# Patient Record
Sex: Male | Born: 1970
Health system: Southern US, Community
[De-identification: ages and names within clinical notes are randomized; demographics above are authoritative.]

## PROBLEM LIST (undated history)

## (undated) DIAGNOSIS — H409 Unspecified glaucoma: Secondary | ICD-10-CM

## (undated) DIAGNOSIS — E0590 Thyrotoxicosis, unspecified without thyrotoxic crisis or storm: Secondary | ICD-10-CM

## (undated) DIAGNOSIS — I10 Essential (primary) hypertension: Secondary | ICD-10-CM

## (undated) DIAGNOSIS — U071 COVID-19: Secondary | ICD-10-CM

## (undated) DIAGNOSIS — B079 Viral wart, unspecified: Secondary | ICD-10-CM

## (undated) DIAGNOSIS — E039 Hypothyroidism, unspecified: Secondary | ICD-10-CM

## (undated) DIAGNOSIS — Z8249 Family history of ischemic heart disease and other diseases of the circulatory system: Secondary | ICD-10-CM

## (undated) DIAGNOSIS — K219 Gastro-esophageal reflux disease without esophagitis: Secondary | ICD-10-CM

## (undated) DIAGNOSIS — B078 Other viral warts: Secondary | ICD-10-CM

## (undated) HISTORY — DX: Hypothyroidism, unspecified: E03.9

## (undated) HISTORY — DX: COVID-19: U07.1

## (undated) HISTORY — DX: Unspecified glaucoma: H40.9

## (undated) HISTORY — DX: Gastro-esophageal reflux disease without esophagitis: K21.9

## (undated) HISTORY — DX: Thyrotoxicosis, unspecified without thyrotoxic crisis or storm: E05.90

## (undated) HISTORY — DX: Family history of ischemic heart disease and other diseases of the circulatory system: Z82.49

## (undated) HISTORY — DX: Viral wart, unspecified: B07.9

## (undated) HISTORY — PX: NO PAST SURGERIES: SHX2092

---
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
## 1998-06-19 HISTORY — PX: ANTERIOR CRUCIATE LIGAMENT REPAIR: SHX115

## 1999-05-04 ENCOUNTER — Ambulatory Visit (HOSPITAL_BASED_OUTPATIENT_CLINIC_OR_DEPARTMENT_OTHER): Admission: RE | Admit: 1999-05-04 | Discharge: 1999-05-05 | Payer: Self-pay | Admitting: Orthopedic Surgery

## 1999-06-20 HISTORY — PX: HAND SURGERY: SHX662

## 2000-09-30 ENCOUNTER — Emergency Department (HOSPITAL_COMMUNITY): Admission: EM | Admit: 2000-09-30 | Discharge: 2000-09-30 | Payer: Self-pay | Admitting: Emergency Medicine

## 2008-04-09 ENCOUNTER — Encounter (HOSPITAL_COMMUNITY): Admission: RE | Admit: 2008-04-09 | Discharge: 2008-06-16 | Payer: Self-pay | Admitting: Internal Medicine

## 2008-07-04 ENCOUNTER — Encounter: Admission: RE | Admit: 2008-07-04 | Discharge: 2008-07-04 | Payer: Self-pay | Admitting: Sports Medicine

## 2015-09-23 ENCOUNTER — Encounter (HOSPITAL_COMMUNITY): Payer: Self-pay | Admitting: *Deleted

## 2015-09-23 ENCOUNTER — Ambulatory Visit (HOSPITAL_COMMUNITY)
Admission: EM | Admit: 2015-09-23 | Discharge: 2015-09-23 | Disposition: A | Discharge status: Home / Self Care | Payer: BLUE CROSS/BLUE SHIELD | Attending: Family Medicine | Admitting: Family Medicine

## 2015-09-23 DIAGNOSIS — A0811 Acute gastroenteropathy due to Norwalk agent: Secondary | ICD-10-CM

## 2015-09-23 LAB — POCT I-STAT, CHEM 8
BUN: 20 mg/dL (ref 6–20)
CHLORIDE: 103 mmol/L (ref 101–111)
Calcium, Ion: 1.26 mmol/L — ABNORMAL HIGH (ref 1.12–1.23)
Creatinine, Ser: 1.5 mg/dL — ABNORMAL HIGH (ref 0.61–1.24)
Glucose, Bld: 130 mg/dL — ABNORMAL HIGH (ref 65–99)
HEMATOCRIT: 54 % — AB (ref 39.0–52.0)
Hemoglobin: 18.4 g/dL — ABNORMAL HIGH (ref 13.0–17.0)
POTASSIUM: 4.1 mmol/L (ref 3.5–5.1)
SODIUM: 140 mmol/L (ref 135–145)
TCO2: 24 mmol/L (ref 0–100)

## 2015-09-23 MED ORDER — ONDANSETRON HCL 4 MG PO TABS: 4.0000 mg | ORAL_TABLET | Freq: Four times a day (QID) | ORAL | Status: DC

## 2015-09-23 MED ORDER — ONDANSETRON HCL 4 MG/2ML IJ SOLN
4.0000 mg | Freq: Once | INTRAMUSCULAR | Status: AC
  Administered 2015-09-23: 4 mg via INTRAVENOUS

## 2015-09-23 MED ORDER — SODIUM CHLORIDE 0.9 % IV BOLUS (SEPSIS)
1000.0000 mL | Freq: Once | INTRAVENOUS | Status: AC
  Administered 2015-09-23: 1000 mL via INTRAVENOUS

## 2015-09-23 MED ORDER — ONDANSETRON HCL 4 MG/2ML IJ SOLN
INTRAMUSCULAR | Status: AC
  Filled 2015-09-23: qty 2

## 2015-09-23 NOTE — ED Provider Notes (Signed)
CSN: [id number]     Arrival date & time 09/23/15  1326 History   First MD Initiated Contact with Patient 09/23/15 1515     Chief Complaint  Patient presents with  . Nausea   (Consider location/radiation/quality/duration/timing/severity/associated sxs/prior Treatment) Patient is a 45 y.o. male presenting with vomiting. The history is provided by the patient and the spouse.  Emesis Severity:  Moderate Duration:  1 day Timing:  Intermittent Quality:  Stomach contents Progression:  Unchanged Chronicity:  New Recent urination:  Normal Relieved by:  Nothing Worsened by:  Nothing tried Ineffective treatments:  None tried Associated symptoms: chills, diarrhea, fever and myalgias   Associated symptoms: no abdominal pain     History reviewed. No pertinent past medical history. History reviewed. No pertinent past surgical history. History reviewed. No pertinent family history. Social History  Substance Use Topics  . Smoking status: Never Smoker   . Smokeless tobacco: None  . Alcohol Use: No    Review of Systems  Constitutional: Positive for fever, chills and appetite change.  HENT: Negative.   Cardiovascular: Negative.   Gastrointestinal: Positive for nausea, vomiting and diarrhea. Negative for abdominal pain and blood in stool.  Musculoskeletal: Positive for myalgias.  All other systems reviewed and are negative.   Allergies  Review of patient's allergies indicates not on file.  Home Medications   Prior to Admission medications   Medication Sig Start Date End Date Taking? Authorizing Provider  ondansetron (ZOFRAN) 4 MG tablet Take 1 tablet (4 mg total) by mouth every 6 (six) hours. Prn n/v 09/23/15   [name], MD   Meds Ordered and Administered this Visit   Medications  ondansetron [facility]) injection 4 mg (4 mg Intravenous Given 09/23/15 1541)  sodium chloride 0.9 % bolus 1,000 mL (1,000 mLs Intravenous Given 09/23/15 1541)    BP 149/99 mmHg  Pulse 98  Temp(Src) 98.8  F (37.1 C) (Oral)  Resp 18  SpO2 100% No data found.   Physical Exam  Constitutional: He is oriented to person, place, and time. He appears well-developed and well-nourished. He is cooperative. He appears ill. No distress.  HENT:  Right Ear: External ear normal.  Left Ear: External ear normal.  Mouth/Throat: Oropharynx is clear and moist. Mucous membranes are dry.  Neck: Normal range of motion. Neck supple.  Cardiovascular: Normal rate, regular rhythm, normal heart sounds and intact distal pulses.   Pulmonary/Chest: Effort normal and breath sounds normal.  Abdominal: Soft. Bowel sounds are normal. He exhibits no distension and no mass. There is tenderness. There is no rebound and no guarding.  Lymphadenopathy:    He has no cervical adenopathy.  Neurological: He is alert and oriented to person, place, and time.  Skin: Skin is warm and dry.  Nursing note and vitals reviewed.   ED Course  Procedures (including critical care time)  Labs Review Labs Reviewed  POCT I-STAT, CHEM 8 - Abnormal; Notable for the following:    Creatinine, Ser 1.50 (*)    Glucose, Bld 130 (*)    Calcium, Ion 1.26 (*)    Hemoglobin 18.4 (*)    HCT 54.0 (*)    All other components within normal limits    Imaging Review No results found.   Visual Acuity Review  Right Eye Distance:   Left Eye Distance:   Bilateral Distance:    Right Eye Near:   Left Eye Near:    Bilateral Near:         MDM   1.  Gastroenteritis due to norovirus    Sx much improved after ivf and meds   [name], MD 09/23/15 2043

## 2015-09-23 NOTE — ED Notes (Signed)
Pt  Reports  Symptoms   Of  Nausea  Vomiting   Diarrhea      With back  Pain    Since  Weds        Mucous  Membranes  Are   Dry

## 2015-09-23 NOTE — Discharge Instructions (Signed)
Clear liquid , bland diet tonight as tolerated, advance on fri as improved, use medicine as needed, return or see your doctor if any problems.

## 2016-03-22 ENCOUNTER — Other Ambulatory Visit: Payer: Self-pay | Admitting: Otolaryngology

## 2016-03-22 DIAGNOSIS — E010 Iodine-deficiency related diffuse (endemic) goiter: Secondary | ICD-10-CM

## 2016-03-31 ENCOUNTER — Other Ambulatory Visit: Payer: BLUE CROSS/BLUE SHIELD

## 2016-04-04 ENCOUNTER — Ambulatory Visit (HOSPITAL_COMMUNITY)
Admission: EM | Admit: 2016-04-04 | Discharge: 2016-04-04 | Disposition: A | Discharge status: Home / Self Care | Payer: BLUE CROSS/BLUE SHIELD | Attending: Family Medicine | Admitting: Family Medicine

## 2016-04-04 ENCOUNTER — Encounter (HOSPITAL_COMMUNITY): Payer: Self-pay | Admitting: *Deleted

## 2016-04-04 DIAGNOSIS — S39012A Strain of muscle, fascia and tendon of lower back, initial encounter: Secondary | ICD-10-CM | POA: Diagnosis not present

## 2016-04-04 MED ORDER — CYCLOBENZAPRINE HCL 5 MG PO TABS: 5.0000 mg | ORAL_TABLET | Freq: Three times a day (TID) | ORAL | 0 refills | Status: DC | PRN

## 2016-04-04 MED ORDER — IBUPROFEN 800 MG PO TABS: 800.0000 mg | ORAL_TABLET | Freq: Three times a day (TID) | ORAL | 0 refills | Status: DC

## 2016-04-04 NOTE — ED Triage Notes (Signed)
Pt  Reports  He lifted  A  Box  8  Days   Ago  And  Felt  painin  His  Lower  Back     He  Reports  He  Has  Been  Working     Since  The  Accident        He  Reports  The  Pain is  Worse  On  Certain movements  And  posistions

## 2016-04-04 NOTE — ED Provider Notes (Signed)
[place]    CSN: [id number] Arrival date & time: 04/04/16  1456     History   Chief Complaint Chief Complaint  Patient presents with  . Back Pain    HPI Douglas Harper is a 45 y.o. male.   The history is provided by the patient.  Back Pain  Location:  Lumbar spine Quality:  Stiffness Radiates to:  Does not radiate Pain severity:  Moderate Onset quality:  Gradual Duration:  10 days Progression:  Worsening Chronicity:  Recurrent Context: occupational injury   Context comment:  Works at [employer] and lifting a box. Relieved by:  Heating pad Worsened by:  Coughing Associated symptoms: no abdominal pain, no abdominal swelling, no bladder incontinence, no bowel incontinence, no chest pain, no fever, no leg pain, no paresthesias, no pelvic pain, no perianal numbness and no weakness     History reviewed. No pertinent past medical history.  There are no active problems to display for this patient.   History reviewed. No pertinent surgical history.     Home Medications    Prior to Admission medications   Medication Sig Start Date End Date Taking? Authorizing Provider  ondansetron (ZOFRAN) 4 MG tablet Take 1 tablet (4 mg total) by mouth every 6 (six) hours. Prn n/v 09/23/15   [name], MD    Family History History reviewed. No pertinent family history.  Social History Social History  Substance Use Topics  . Smoking status: Never Smoker  . Smokeless tobacco: Never Used  . Alcohol use Yes     Allergies   Review of patient's allergies indicates no known allergies.   Review of Systems Review of Systems  Constitutional: Negative.  Negative for fever.  Cardiovascular: Negative for chest pain.  Gastrointestinal: Negative.  Negative for abdominal pain and bowel incontinence.  Genitourinary: Negative.  Negative for bladder incontinence and pelvic pain.  Musculoskeletal: Positive for back pain.  Neurological: Negative.  Negative for weakness and  paresthesias.  All other systems reviewed and are negative.    Physical Exam Triage Vital Signs ED Triage Vitals  Enc Vitals Group     BP 04/04/16 1520 155/97     Pulse Rate 04/04/16 1520 70     Resp 04/04/16 1520 16     Temp 04/04/16 1520 98 F (36.7 C)     Temp Source 04/04/16 1520 Oral     SpO2 04/04/16 1520 96 %     Weight --      Height --      Head Circumference --      Peak Flow --      Pain Score 04/04/16 1528 9     Pain Loc --      Pain Edu? --      Excl. in [place]? --    No data found.   Updated Vital Signs BP 155/97 (BP Location: Left Arm)   Pulse 70   Temp 98 F (36.7 C) (Oral)   Resp 16   SpO2 96%   Visual Acuity Right Eye Distance:   Left Eye Distance:   Bilateral Distance:    Right Eye Near:   Left Eye Near:    Bilateral Near:     Physical Exam  Constitutional: He is oriented to person, place, and time. He appears well-developed and well-nourished. No distress.  Abdominal: Soft. Bowel sounds are normal.  Musculoskeletal: He exhibits tenderness.       Lumbar back: He exhibits decreased range of motion, tenderness, pain and  spasm. He exhibits no bony tenderness, no swelling and normal pulse.  Neurological: He is alert and oriented to person, place, and time.  Skin: Skin is warm and dry.  Nursing note and vitals reviewed.    UC Treatments / Results  Labs (all labs ordered are listed, but only abnormal results are displayed) Labs Reviewed - No data to display  EKG  EKG Interpretation None       Radiology No results found.  Procedures Procedures (including critical care time)  Medications Ordered in UC Medications - No data to display   Initial Impression / Assessment and Plan / UC Course  I have reviewed the triage vital signs and the nursing notes.  Pertinent labs & imaging results that were available during my care of the patient were reviewed by me and considered in my medical decision making (see chart for  details).  Clinical Course      Final Clinical Impressions(s) / UC Diagnoses   Final diagnoses:  None    New Prescriptions New Prescriptions   No medications on file     [name], MD 04/04/16 1540

## 2016-04-04 NOTE — Discharge Instructions (Signed)
Heat, stretch and medicine as discussed.

## 2016-04-10 ENCOUNTER — Ambulatory Visit
Admission: RE | Admit: 2016-04-10 | Discharge: 2016-04-10 | Disposition: A | Discharge status: Home / Self Care | Payer: BLUE CROSS/BLUE SHIELD | Source: Ambulatory Visit | Attending: Otolaryngology | Admitting: Otolaryngology

## 2016-04-10 DIAGNOSIS — E010 Iodine-deficiency related diffuse (endemic) goiter: Secondary | ICD-10-CM

## 2016-11-21 ENCOUNTER — Other Ambulatory Visit: Payer: Self-pay | Admitting: Nurse Practitioner

## 2016-11-21 ENCOUNTER — Ambulatory Visit
Admission: RE | Admit: 2016-11-21 | Discharge: 2016-11-21 | Disposition: A | Discharge status: Home / Self Care | Payer: BLUE CROSS/BLUE SHIELD | Source: Ambulatory Visit | Attending: Nurse Practitioner | Admitting: Nurse Practitioner

## 2016-11-21 DIAGNOSIS — M545 Low back pain, unspecified: Secondary | ICD-10-CM

## 2018-07-23 DIAGNOSIS — L089 Local infection of the skin and subcutaneous tissue, unspecified: Secondary | ICD-10-CM | POA: Diagnosis not present

## 2018-07-23 DIAGNOSIS — L309 Dermatitis, unspecified: Secondary | ICD-10-CM | POA: Diagnosis not present

## 2018-08-20 DIAGNOSIS — L309 Dermatitis, unspecified: Secondary | ICD-10-CM | POA: Diagnosis not present

## 2018-08-22 ENCOUNTER — Encounter: Payer: Self-pay | Admitting: Podiatry

## 2018-08-22 ENCOUNTER — Ambulatory Visit: Payer: 59 | Admitting: Podiatry

## 2018-08-22 VITALS — BP 127/84

## 2018-08-22 DIAGNOSIS — L859 Epidermal thickening, unspecified: Secondary | ICD-10-CM | POA: Diagnosis not present

## 2018-08-22 DIAGNOSIS — B070 Plantar wart: Secondary | ICD-10-CM | POA: Diagnosis not present

## 2018-08-22 NOTE — Patient Instructions (Signed)

## 2018-08-25 NOTE — Progress Notes (Signed)
Subjective:   Patient ID: Douglas Harper, male   DOB: 48 y.o.   MRN: [id number]   HPI Patient presents with a very painful lesion on the outside of the right foot and states that it makes it hard for him to be active on it or walk on.  States that it is been hurting for at least 6 months and patient does not smoke likes to be active   Review of Systems  All other systems reviewed and are negative.       Objective:  Physical Exam Vitals signs and nursing note reviewed.  Constitutional:      Appearance: He is well-developed.  Pulmonary:     Effort: Pulmonary effort is normal.  Musculoskeletal: Normal range of motion.  Skin:    General: Skin is warm.  Neurological:     Mental Status: He is alert.     Neurovascular status intact muscle strength is adequate range of motion within normal limits with patient found to have a lesion on the plantar aspect of the right foot that measures approximately 1.3 cm x 1.2 cm and it shows pinpoint bleeding upon debridement and pain to lateral pressure.  Patient has good digital perfusion well oriented x3     Assessment:  Verruca plantaris plantar aspect right most likely scenario with quite a bit of pain and solitary lesion     Plan:  H&P and discussed chemical treatment versus excision and patient is opted for excision and I explained procedure and risk and he signed consent form understanding risk and the fact that it also could recur.  Today I went ahead and I infiltrated the right lateral foot with 120 mg Xylocaine with epinephrine sterile prep applied and using sterile instrumentation I excised the lesion in toto and I placed it in formalin for pathological evaluation and applied sterile dressing to the foot and instructed on reduced activity and weightbearing and padding.  Reappoint and we will check pathology for any abnormal condition

## 2018-10-17 MED FILL — LEVOTHYROXINE 112 MCG TAB: 112 | 90 days supply | Qty: 90 | Fill #0

## 2018-11-27 ENCOUNTER — Ambulatory Visit (INDEPENDENT_AMBULATORY_CARE_PROVIDER_SITE_OTHER): Payer: 59 | Admitting: Internal Medicine

## 2018-11-27 ENCOUNTER — Encounter: Payer: Self-pay | Admitting: Internal Medicine

## 2018-11-27 ENCOUNTER — Other Ambulatory Visit: Payer: Self-pay

## 2018-11-27 DIAGNOSIS — Z13220 Encounter for screening for lipoid disorders: Secondary | ICD-10-CM | POA: Diagnosis not present

## 2018-11-27 DIAGNOSIS — Z1389 Encounter for screening for other disorder: Secondary | ICD-10-CM | POA: Diagnosis not present

## 2018-11-27 DIAGNOSIS — Z1159 Encounter for screening for other viral diseases: Secondary | ICD-10-CM

## 2018-11-27 DIAGNOSIS — K219 Gastro-esophageal reflux disease without esophagitis: Secondary | ICD-10-CM | POA: Insufficient documentation

## 2018-11-27 DIAGNOSIS — Z125 Encounter for screening for malignant neoplasm of prostate: Secondary | ICD-10-CM

## 2018-11-27 DIAGNOSIS — E049 Nontoxic goiter, unspecified: Secondary | ICD-10-CM

## 2018-11-27 DIAGNOSIS — E0590 Thyrotoxicosis, unspecified without thyrotoxic crisis or storm: Secondary | ICD-10-CM

## 2018-11-27 DIAGNOSIS — Z1329 Encounter for screening for other suspected endocrine disorder: Secondary | ICD-10-CM

## 2018-11-27 DIAGNOSIS — R1310 Dysphagia, unspecified: Secondary | ICD-10-CM

## 2018-11-27 DIAGNOSIS — Z0000 Encounter for general adult medical examination without abnormal findings: Secondary | ICD-10-CM

## 2018-11-27 DIAGNOSIS — Z0184 Encounter for antibody response examination: Secondary | ICD-10-CM

## 2018-11-27 DIAGNOSIS — R739 Hyperglycemia, unspecified: Secondary | ICD-10-CM

## 2018-11-27 DIAGNOSIS — E039 Hypothyroidism, unspecified: Secondary | ICD-10-CM | POA: Insufficient documentation

## 2018-11-27 DIAGNOSIS — E559 Vitamin D deficiency, unspecified: Secondary | ICD-10-CM | POA: Diagnosis not present

## 2018-11-27 DIAGNOSIS — R1013 Epigastric pain: Secondary | ICD-10-CM | POA: Diagnosis not present

## 2018-11-27 DIAGNOSIS — Z8249 Family history of ischemic heart disease and other diseases of the circulatory system: Secondary | ICD-10-CM | POA: Insufficient documentation

## 2018-11-27 NOTE — Progress Notes (Signed)
Virtual Visit via Video Note  I connected with Douglas Harper  on 11/27/18 at  2:40 PM EDT by a video enabled telemedicine application and verified that I am speaking with the correct person using two identifiers.  Location patient: home Location provider:work  Persons participating in the virtual visit: patient, provider  I discussed the limitations of evaluation and management by telemedicine and the availability of in person appointments. The patient expressed understanding and agreed to proceed.   HPI: 1. Hyperthyroidism history with RAI in 2009 on levo 112 mcg daily compliant and c/o lump in throat with swallowing  2. GERD c/o increased GERD/heartburn x few weeks tried prilosec otc x 10 days helped initially but still sx's return  3. FH heart disease in mother and she died age 39 or 66 of hereditary heart condition pt does not know the name and he and all 8 siblings advised to get echo and be tested for genetic heart condition     ROS: See pertinent positives and negatives per HPI.  Past Medical History:  Diagnosis Date  . FH: heart disease   . GERD (gastroesophageal reflux disease)   . Hyperthyroidism    s/p RAI 2009   . Hypothyroidism     Past Surgical History:  Procedure Laterality Date  . NO PAST SURGERIES      Family History  Problem Relation Age of Onset  . Heart disease Mother   . Hypertension Mother   . Diabetes Mother     SOCIAL HX:  Married wife Douglas Harper DPR  From [place] been here since 48 y.o  Works Designer, [employer]  No tobacco, drugs, occas. etoh    Current Outpatient Medications:  .  ibuprofen (ADVIL,MOTRIN) 800 MG tablet, Take 1 tablet (800 mg total) by mouth 3 (three) times daily., Disp: 30 tablet, Rfl: 0 .  levothyroxine (SYNTHROID, LEVOTHROID) 112 MCG tablet, TAKE 1 TABLET EVERY DAY IN THE MORNING ON EMPTY STOMACH, Disp: , Rfl:   EXAM:  VITALS per patient if applicable:  GENERAL: alert, oriented, appears well and in no acute  distress  HEENT: atraumatic, conjunttiva clear, no obvious abnormalities on inspection of external nose and ears  NECK: normal movements of the head and neck  LUNGS: on inspection no signs of respiratory distress, breathing rate appears normal, no obvious gross SOB, gasping or wheezing  CV: no obvious cyanosis  MS: moves all visible extremities without noticeable abnormality  PSYCH/NEURO: pleasant and cooperative, no obvious depression or anxiety, speech and thought processing grossly intact  ASSESSMENT AND PLAN:  Discussed the following assessment and plan:  Gastroesophageal reflux disease, esophagitis presence not specified - Plan: H Pylori, IGM, IGG, IGA AB Dyspepsia - Plan: H Pylori, IGM, IGG, IGA AB -mailed food list gerd   Hyperthyroidism s/p RAI 2009 with dysphagia - Plan: US THYROID GI in [place] Levo 112 mcg qd  Goiter now with hypothyroidism- Plan: US THYROID -cont thyroid med   FH: heart disease - Plan: Ambulatory referral to Cardiac Electrophysiology Dr. Virl Axe to further w/u with echo and consider genetic testing   HM utd flu shot  Check hep B and MMR Consider Tdap in future   Check PSA Colonoscopy due age 36 y.o     I discussed the assessment and treatment plan with the patient. The patient was provided an opportunity to ask questions and all were answered. The patient agreed with the plan and demonstrated an understanding of the instructions.   The patient was advised to  call back or seek an in-person evaluation if the symptoms worsen or if the condition fails to improve as anticipated.  Time spent 25 minutes  Delorise Jackson, MD

## 2018-11-27 NOTE — Patient Instructions (Signed)
Goiter  A goiter is an enlarged thyroid gland. The thyroid is located in the lower front of the neck. It makes hormones that affect many body parts and systems, including the system that affects how quickly the body burns fuel for energy (metabolism). Most goiters are painless and are not a cause for concern. Some goiters can affect the way your thyroid makes thyroid hormones. Goiters and conditions that cause goiters can be treated, if necessary. What are the causes? Common causes of this condition include:  Lack (deficiency) of a mineral called iodine. The thyroid gland uses iodine to make thyroid hormones.  Diseases that attack healthy cells in the body (autoimmune diseases) and affect thyroid function, such as Graves' disease or Hashimoto's disease. These diseases may cause the body to produce too much thyroid hormone (hyperthyroidism) or too little of the hormone (hypothyroidism).  Conditions that cause inflammation of the thyroid (thyroiditis).  One or more small growths on the thyroid (nodular goiter). Other causes include:  Medical problems caused by abnormal genes that are passed from parent to child (genetic defects).  Thyroid injury or infection.  Tumors that may or may not be cancerous.  Pregnancy.  Certain medicines.  Exposure to radiation. In some cases, the cause may not be known. What increases the risk? This condition is more likely to develop in:  People who do not get enough iodine in their diet.  People who have a family history of goiter.  Women.  People who are older than age 40.  People who smoke tobacco.  People who have had exposure to radiation. What are the signs or symptoms? The main symptom of this condition is swelling in the lower, front part of the neck. This swelling can range from a very small bump to a large lump. Other symptoms may include:  A tight feeling in the throat.  A hoarse voice.  Coughing.  Wheezing.  Difficulty  swallowing or breathing.  Bulging veins in the neck.  Dizziness. When a goiter is the result of an overactive thyroid (hyperthyroidism), symptoms may also include:  Nervousness or restlessness.  Inability to tolerate heat.  Unexplained weight loss.  Diarrhea.  Change in the texture of hair or skin.  Changes in heartbeat, such as skipped beats, extra beats, or a rapid heart rate.  Loss of menstruation.  Shaky hands.  Increased appetite.  Sleep problems. When a goiter is the result of an underactive thyroid (hypothyroidism), symptoms may also include:  Feeling like you have no energy (lethargy).  Inability to tolerate cold.  Weight gain that is not explained by a change in diet or exercise habits.  Dry skin.  Coarse hair.  Irregular menstrual periods.  Constipation.  Sadness or depression.  Fatigue. In some cases, there may not be any symptoms and the thyroid hormone levels may be normal. How is this diagnosed? This condition may be diagnosed based on your symptoms, your medical history, and a physical exam. You may have tests, such as:  Blood tests to check thyroid function.  Imaging tests, such as: ? Ultrasound. ? CT scan. ? MRI. ? Thyroid scan.  Removal of a tissue sample (biopsy) of the goiter or any nodules. The sample will be tested to check for cancer. How is this treated? Treatment for this condition depends on the cause and your symptoms. Treatment may include:  Medicines to regulate thyroid hormone levels.  Anti-inflammatory medicines or steroid medicines, if the goiter is caused by inflammation.  Iodine supplements or changes to your   diet, if the goiter is caused by iodine deficiency.  Radioactive iodine treatment.  Surgery to remove your thyroid. In some cases, you may only need regular check-ups with your health care provider to monitor your condition, and you may not need treatment. Follow these instructions at home:  Follow  instructions from your health care provider about any changes to your diet.  Take over-the-counter and prescription medicines only as told by your health care provider. These include supplements.  Do not use any products that contain nicotine or tobacco, such as cigarettes and e-cigarettes. If you need help quitting, ask your health care provider.  Keep all follow-up visits as told by your health care provider. This is important. Contact a health care provider if:  Your symptoms do not get better with treatment.  You have nausea, vomiting, or diarrhea. Get help right away if:  You have sudden, unexplained confusion or other mental changes.  You have a fever.  You have chest pain.  You have trouble breathing or swallowing.  You suddenly become very weak.  You experience extreme restlessness.  You feel your heart racing. Summary  A goiter is an enlarged thyroid gland.  The thyroid gland is located in the lower front of the neck. It makes hormones that affect many body parts and systems, including the system that affects how quickly the body burns fuel for energy (metabolism).  The main symptom of this condition is swelling in the lower, front part of the neck. This swelling can range from a very small bump to a large lump.  Treatment for this condition depends on the cause and your symptoms. You may need medicines, supplements, or regular monitoring of your condition. This information is not intended to replace advice given to you by your health care provider. Make sure you discuss any questions you have with your health care provider. Document Released: 11/23/2009 Document Revised: 03/01/2017 Document Reviewed: 03/01/2017 Elsevier Interactive Patient Education  2019 [place].  Gastroesophageal Reflux Disease, Adult Gastroesophageal reflux (GER) happens when acid from the stomach flows up into the tube that connects the mouth and the stomach (esophagus). Normally, food  travels down the esophagus and stays in the stomach to be digested. However, when a person has GER, food and stomach acid sometimes move back up into the esophagus. If this becomes a more serious problem, the person may be diagnosed with a disease called gastroesophageal reflux disease (GERD). GERD occurs when the reflux:  Happens often.  Causes frequent or severe symptoms.  Causes problems such as damage to the esophagus. When stomach acid comes in contact with the esophagus, the acid may cause soreness (inflammation) in the esophagus. Over time, GERD may create small holes (ulcers) in the lining of the esophagus. What are the causes? This condition is caused by a problem with the muscle between the esophagus and the stomach (lower esophageal sphincter, or LES). Normally, the LES muscle closes after food passes through the esophagus to the stomach. When the LES is weakened or abnormal, it does not close properly, and that allows food and stomach acid to go back up into the esophagus. The LES can be weakened by certain dietary substances, medicines, and medical conditions, including:  Tobacco use.  Pregnancy.  Having a hiatal hernia.  Alcohol use.  Certain foods and beverages, such as coffee, chocolate, onions, and peppermint. What increases the risk? You are more likely to develop this condition if you:  Have an increased body weight.  Have a connective tissue  disorder.  Use NSAID medicines. What are the signs or symptoms? Symptoms of this condition include:  Heartburn.  Difficult or painful swallowing.  The feeling of having a lump in the throat.  Abitter taste in the mouth.  Bad breath.  Having a large amount of saliva.  Having an upset or bloated stomach.  Belching.  Chest pain. Different conditions can cause chest pain. Make sure you see your health care provider if you experience chest pain.  Shortness of breath or wheezing.  Ongoing (chronic) cough or a  night-time cough.  Wearing away of tooth enamel.  Weight loss. How is this diagnosed? Your health care provider will take a medical history and perform a physical exam. To determine if you have mild or severe GERD, your health care provider may also monitor how you respond to treatment. You may also have tests, including:  A test to examine your stomach and esophagus with a small camera (endoscopy).  A test thatmeasures the acidity level in your esophagus.  A test thatmeasures how much pressure is on your esophagus.  A barium swallow or modified barium swallow test to show the shape, size, and functioning of your esophagus. How is this treated? The goal of treatment is to help relieve your symptoms and to prevent complications. Treatment for this condition may vary depending on how severe your symptoms are. Your health care provider may recommend:  Changes to your diet.  Medicine.  Surgery. Follow these instructions at home: Eating and drinking   Follow a diet as recommended by your health care provider. This may involve avoiding foods and drinks such as: ? Coffee and tea (with or without caffeine). ? Drinks that containalcohol. ? Energy drinks and sports drinks. ? Carbonated drinks or sodas. ? Chocolate and cocoa. ? Peppermint and mint flavorings. ? Garlic and onions. ? Horseradish. ? Spicy and acidic foods, including peppers, chili powder, curry powder, vinegar, hot sauces, and barbecue sauce. ? Citrus fruit juices and citrus fruits, such as oranges, lemons, and limes. ? Tomato-based foods, such as red sauce, chili, salsa, and pizza with red sauce. ? Fried and fatty foods, such as donuts, french fries, potato chips, and high-fat dressings. ? High-fat meats, such as hot dogs and fatty cuts of red and white meats, such as rib eye steak, sausage, ham, and bacon. ? High-fat dairy items, such as whole milk, butter, and cream cheese.  Eat small, frequent meals instead of  large meals.  Avoid drinking large amounts of liquid with your meals.  Avoid eating meals during the 2-3 hours before bedtime.  Avoid lying down right after you eat.  Do not exercise right after you eat. Lifestyle   Do not use any products that contain nicotine or tobacco, such as cigarettes, e-cigarettes, and chewing tobacco. If you need help quitting, ask your health care provider.  Try to reduce your stress by using methods such as yoga or meditation. If you need help reducing stress, ask your health care provider.  If you are overweight, reduce your weight to an amount that is healthy for you. Ask your health care provider for guidance about a safe weight loss goal. General instructions  Pay attention to any changes in your symptoms.  Take over-the-counter and prescription medicines only as told by your health care provider. Do not take aspirin, ibuprofen, or other NSAIDs unless your health care provider told you to do so.  Wear loose-fitting clothing. Do not wear anything tight around your waist that causes pressure on  your abdomen.  Raise (elevate) the head of your bed about 6 inches (15 cm).  Avoid bending over if this makes your symptoms worse.  Keep all follow-up visits as told by your health care provider. This is important. Contact a health care provider if:  You have: ? New symptoms. ? Unexplained weight loss. ? Difficulty swallowing or it hurts to swallow. ? Wheezing or a persistent cough. ? A hoarse voice.  Your symptoms do not improve with treatment. Get help right away if you:  Have pain in your arms, neck, jaw, teeth, or back.  Feel sweaty, dizzy, or light-headed.  Have chest pain or shortness of breath.  Vomit and your vomit looks like blood or coffee grounds.  Faint.  Have stool that is bloody or black.  Cannot swallow, drink, or eat. Summary  Gastroesophageal reflux happens when acid from the stomach flows up into the esophagus. GERD is a  disease in which the reflux happens often, causes frequent or severe symptoms, or causes problems such as damage to the esophagus.  Treatment for this condition may vary depending on how severe your symptoms are. Your health care provider may recommend diet and lifestyle changes, medicine, or surgery.  Contact a health care provider if you have new or worsening symptoms.  Take over-the-counter and prescription medicines only as told by your health care provider. Do not take aspirin, ibuprofen, or other NSAIDs unless your health care provider told you to do so.  Keep all follow-up visits as told by your health care provider. This is important. This information is not intended to replace advice given to you by your health care provider. Make sure you discuss any questions you have with your health care provider. Document Released: 03/15/2005 Document Revised: 12/12/2017 Document Reviewed: 12/12/2017 Elsevier Interactive Patient Education  2019 [place] for Gastroesophageal Reflux Disease, Adult When you have gastroesophageal reflux disease (GERD), the foods you eat and your eating habits are very important. Choosing the right foods can help ease the discomfort of GERD. Consider working with a diet and nutrition specialist (dietitian) to help you make healthy food choices. What general guidelines should I follow?  Eating plan  Choose healthy foods low in fat, such as fruits, vegetables, whole grains, low-fat dairy products, and lean meat, fish, and poultry.  Eat frequent, small meals instead of three large meals each day. Eat your meals slowly, in a relaxed setting. Avoid bending over or lying down until 2-3 hours after eating.  Limit high-fat foods such as fatty meats or fried foods.  Limit your intake of oils, butter, and shortening to less than 8 teaspoons each day.  Avoid the following: ? Foods that cause symptoms. These may be different for different people. Keep a  food diary to keep track of foods that cause symptoms. ? Alcohol. ? Drinking large amounts of liquid with meals. ? Eating meals during the 2-3 hours before bed.  Cook foods using methods other than frying. This may include baking, grilling, or broiling. Lifestyle  Maintain a healthy weight. Ask your health care provider what weight is healthy for you. If you need to lose weight, work with your health care provider to do so safely.  Exercise for at least 30 minutes on 5 or more days each week, or as told by your health care provider.  Avoid wearing clothes that fit tightly around your waist and chest.  Do not use any products that contain nicotine or tobacco, such as cigarettes and  e-cigarettes. If you need help quitting, ask your health care provider.  Sleep with the head of your bed raised. Use a wedge under the mattress or blocks under the bed frame to raise the head of the bed. What foods are not recommended? The items listed may not be a complete list. Talk with your dietitian about what dietary choices are best for you. Grains Pastries or quick breads with added fat. [place] toast. Vegetables Deep fried vegetables. [place] fries. Any vegetables prepared with added fat. Any vegetables that cause symptoms. For some people this may include tomatoes and tomato products, chili peppers, onions and garlic, and horseradish. Fruits Any fruits prepared with added fat. Any fruits that cause symptoms. For some people this may include citrus fruits, such as oranges, grapefruit, pineapple, and lemons. Meats and other protein foods High-fat meats, such as fatty beef or pork, hot dogs, ribs, ham, sausage, salami and bacon. Fried meat or protein, including fried fish and fried chicken. Nuts and nut butters. Dairy Whole milk and chocolate milk. Sour cream. Cream. Ice cream. Cream cheese. Milk shakes. Beverages Coffee and tea, with or without caffeine. Carbonated beverages. Sodas. Energy drinks. Fruit  juice made with acidic fruits (such as orange or grapefruit). Tomato juice. Alcoholic drinks. Fats and oils Butter. Margarine. Shortening. Ghee. Sweets and desserts Chocolate and cocoa. Donuts. Seasoning and other foods Pepper. Peppermint and spearmint. Any condiments, herbs, or seasonings that cause symptoms. For some people, this may include curry, hot sauce, or vinegar-based salad dressings. Summary  When you have gastroesophageal reflux disease (GERD), food and lifestyle choices are very important to help ease the discomfort of GERD.  Eat frequent, small meals instead of three large meals each day. Eat your meals slowly, in a relaxed setting. Avoid bending over or lying down until 2-3 hours after eating.  Limit high-fat foods such as fatty meat or fried foods. This information is not intended to replace advice given to you by your health care provider. Make sure you discuss any questions you have with your health care provider. Document Released: 06/05/2005 Document Revised: 06/06/2016 Document Reviewed: 06/06/2016 Elsevier Interactive Patient Education  2019 [employer].

## 2018-11-28 ENCOUNTER — Other Ambulatory Visit: Payer: Self-pay

## 2018-11-28 ENCOUNTER — Other Ambulatory Visit (INDEPENDENT_AMBULATORY_CARE_PROVIDER_SITE_OTHER): Payer: 59

## 2018-11-28 DIAGNOSIS — Z0184 Encounter for antibody response examination: Secondary | ICD-10-CM | POA: Diagnosis not present

## 2018-11-28 DIAGNOSIS — Z0000 Encounter for general adult medical examination without abnormal findings: Secondary | ICD-10-CM

## 2018-11-28 DIAGNOSIS — Z1159 Encounter for screening for other viral diseases: Secondary | ICD-10-CM | POA: Diagnosis not present

## 2018-11-28 DIAGNOSIS — R739 Hyperglycemia, unspecified: Secondary | ICD-10-CM | POA: Diagnosis not present

## 2018-11-28 DIAGNOSIS — E559 Vitamin D deficiency, unspecified: Secondary | ICD-10-CM

## 2018-11-28 DIAGNOSIS — Z13220 Encounter for screening for lipoid disorders: Secondary | ICD-10-CM | POA: Diagnosis not present

## 2018-11-28 DIAGNOSIS — R1013 Epigastric pain: Secondary | ICD-10-CM

## 2018-11-28 DIAGNOSIS — Z1329 Encounter for screening for other suspected endocrine disorder: Secondary | ICD-10-CM | POA: Diagnosis not present

## 2018-11-28 DIAGNOSIS — Z1389 Encounter for screening for other disorder: Secondary | ICD-10-CM

## 2018-11-28 DIAGNOSIS — Z125 Encounter for screening for malignant neoplasm of prostate: Secondary | ICD-10-CM | POA: Diagnosis not present

## 2018-11-28 DIAGNOSIS — K219 Gastro-esophageal reflux disease without esophagitis: Secondary | ICD-10-CM | POA: Diagnosis not present

## 2018-11-29 ENCOUNTER — Other Ambulatory Visit (INDEPENDENT_AMBULATORY_CARE_PROVIDER_SITE_OTHER): Payer: 59

## 2018-11-29 ENCOUNTER — Other Ambulatory Visit: Payer: Self-pay | Admitting: Internal Medicine

## 2018-11-29 DIAGNOSIS — R718 Other abnormality of red blood cells: Secondary | ICD-10-CM

## 2018-11-29 LAB — LIPID PANEL
Cholesterol: 186 mg/dL (ref 0–200)
HDL: 43.1 mg/dL (ref >39.00)
LDL Cholesterol: 127 mg/dL — ABNORMAL HIGH (ref 0–99)
NonHDL: 142.5
Total CHOL/HDL Ratio: 4
Triglycerides: 76 mg/dL (ref 0.0–149.0)
VLDL: 15.2 mg/dL (ref 0.0–40.0)

## 2018-11-29 LAB — COMPREHENSIVE METABOLIC PANEL
ALT: 66 U/L — ABNORMAL HIGH (ref 0–53)
AST: 39 U/L — ABNORMAL HIGH (ref 0–37)
Albumin: 4.5 g/dL (ref 3.5–5.2)
Alkaline Phosphatase: 54 U/L (ref 39–117)
BUN: 11 mg/dL (ref 6–23)
CO2: 29 mEq/L (ref 19–32)
Calcium: 9.5 mg/dL (ref 8.4–10.5)
Chloride: 102 mEq/L (ref 96–112)
Creatinine, Ser: 1.21 mg/dL (ref 0.40–1.50)
GFR: 77.2 mL/min (ref >60.00)
Glucose, Bld: 87 mg/dL (ref 70–99)
Potassium: 4.3 mEq/L (ref 3.5–5.1)
Sodium: 138 mEq/L (ref 135–145)
Total Bilirubin: 1.5 mg/dL — ABNORMAL HIGH (ref 0.2–1.2)
Total Protein: 7.6 g/dL (ref 6.0–8.3)

## 2018-11-29 LAB — CBC WITH DIFFERENTIAL/PLATELET
Basophils Absolute: 0 10*3/uL (ref 0.0–0.1)
Basophils Relative: 1.1 % (ref 0.0–3.0)
Eosinophils Absolute: 0.1 10*3/uL (ref 0.0–0.7)
Eosinophils Relative: 2.3 % (ref 0.0–5.0)
HCT: 43.6 % (ref 39.0–52.0)
Hemoglobin: 13.5 g/dL (ref 13.0–17.0)
Lymphocytes Relative: 33 % (ref 12.0–46.0)
Lymphs Abs: 1.3 10*3/uL (ref 0.7–4.0)
MCHC: 31.1 g/dL (ref 30.0–36.0)
MCV: 63.9 fl — ABNORMAL LOW (ref 78.0–100.0)
Monocytes Absolute: 0.3 10*3/uL (ref 0.1–1.0)
Monocytes Relative: 8.4 % (ref 3.0–12.0)
Neutro Abs: 2.2 10*3/uL (ref 1.4–7.7)
Neutrophils Relative %: 55.2 % (ref 43.0–77.0)
Platelets: 156 10*3/uL (ref 150.0–400.0)
RBC: 6.82 Mil/uL — ABNORMAL HIGH (ref 4.22–5.81)
RDW: 16.8 % — ABNORMAL HIGH (ref 11.5–15.5)
WBC: 4 10*3/uL (ref 4.0–10.5)

## 2018-11-29 LAB — TSH: TSH: 5.76 u[IU]/mL — ABNORMAL HIGH (ref 0.35–4.50)

## 2018-11-29 LAB — HEMOGLOBIN A1C: Hgb A1c MFr Bld: 6 % (ref 4.6–6.5)

## 2018-11-29 LAB — URINALYSIS, ROUTINE W REFLEX MICROSCOPIC
Bacteria, UA: NONE SEEN /HPF
Bilirubin Urine: NEGATIVE
Glucose, UA: NEGATIVE
Hgb urine dipstick: NEGATIVE
Hyaline Cast: NONE SEEN /LPF
Ketones, ur: NEGATIVE
Leukocytes,Ua: NEGATIVE
Nitrite: NEGATIVE
Specific Gravity, Urine: 1.025 (ref 1.001–1.03)
Squamous Epithelial / LPF: NONE SEEN /HPF (ref <=5)
WBC, UA: NONE SEEN /HPF (ref 0–5)
pH: 5.5 (ref 5.0–8.0)

## 2018-11-29 LAB — T4, FREE: Free T4: 0.92 ng/dL (ref 0.60–1.60)

## 2018-11-29 LAB — IBC + FERRITIN
Ferritin: 178.5 ng/mL (ref 22.0–322.0)
Iron: 92 ug/dL (ref 42–165)
Saturation Ratios: 28.1 % (ref 20.0–50.0)
Transferrin: 234 mg/dL (ref 212.0–360.0)

## 2018-11-29 LAB — VITAMIN D 25 HYDROXY (VIT D DEFICIENCY, FRACTURES): VITD: 29.61 ng/mL — ABNORMAL LOW (ref 30.00–100.00)

## 2018-11-29 LAB — T3, FREE: T3, Free: 2.7 pg/mL (ref 2.3–4.2)

## 2018-11-29 LAB — PSA: PSA: 0.64 ng/mL (ref 0.10–4.00)

## 2018-11-30 LAB — H PYLORI, IGM, IGG, IGA AB
H pylori, IgM Abs: <9 units (ref 0.0–8.9)
H. pylori, IgA Abs: <9 units (ref 0.0–8.9)
H. pylori, IgG AbS: 0.5 Index Value (ref 0.00–0.79)

## 2018-12-02 LAB — MEASLES/MUMPS/RUBELLA IMMUNITY
Mumps IgG: 216 AU/mL
Rubella: <0.9 index — ABNORMAL LOW
Rubeola IgG: <13.5 AU/mL — ABNORMAL LOW

## 2018-12-02 LAB — HEPATITIS B SURFACE ANTIBODY, QUANTITATIVE: Hepatitis B-Post: <5 m[IU]/mL — ABNORMAL LOW (ref >=10)

## 2018-12-03 ENCOUNTER — Other Ambulatory Visit: Payer: Self-pay | Admitting: Internal Medicine

## 2018-12-03 DIAGNOSIS — R7989 Other specified abnormal findings of blood chemistry: Secondary | ICD-10-CM

## 2018-12-23 ENCOUNTER — Other Ambulatory Visit: Payer: Self-pay | Admitting: Internal Medicine

## 2018-12-23 ENCOUNTER — Telehealth: Payer: Self-pay

## 2018-12-23 ENCOUNTER — Ambulatory Visit
Admission: RE | Admit: 2018-12-23 | Discharge: 2018-12-23 | Disposition: A | Discharge status: Home / Self Care | Payer: 59 | Source: Ambulatory Visit | Attending: Internal Medicine | Admitting: Internal Medicine

## 2018-12-23 DIAGNOSIS — R7989 Other specified abnormal findings of blood chemistry: Secondary | ICD-10-CM

## 2018-12-23 DIAGNOSIS — E039 Hypothyroidism, unspecified: Secondary | ICD-10-CM

## 2018-12-23 DIAGNOSIS — R945 Abnormal results of liver function studies: Secondary | ICD-10-CM | POA: Diagnosis not present

## 2018-12-23 DIAGNOSIS — E034 Atrophy of thyroid (acquired): Secondary | ICD-10-CM | POA: Diagnosis not present

## 2018-12-23 DIAGNOSIS — R1310 Dysphagia, unspecified: Secondary | ICD-10-CM

## 2018-12-23 DIAGNOSIS — E890 Postprocedural hypothyroidism: Secondary | ICD-10-CM

## 2018-12-23 DIAGNOSIS — Z0183 Encounter for blood typing: Secondary | ICD-10-CM

## 2018-12-23 DIAGNOSIS — E0590 Thyrotoxicosis, unspecified without thyrotoxic crisis or storm: Secondary | ICD-10-CM

## 2018-12-23 DIAGNOSIS — E049 Nontoxic goiter, unspecified: Secondary | ICD-10-CM

## 2018-12-23 MED ORDER — LEVOTHYROXINE SODIUM 125 MCG PO TABS: 125.0000 ug | ORAL_TABLET | Freq: Every day | ORAL | 1 refills | Status: DC

## 2018-12-23 MED FILL — LEVOTHYROXINE 125 MCG TABLE: 125 | 90 days supply | Qty: 90 | Fill #0

## 2018-12-23 NOTE — Telephone Encounter (Signed)
Copied from [place] [id number]. Topic: General - Other >> Dec 23, 2018 11:57 AM Leward Quan A wrote: Reason for CRM: Patient called to request Rx for levothyroxine (SYNTHROID) 125 MCG tablet  to be sent to the [place], [place] [facility] [place] [phone] (Phone) [phone] (Fax)

## 2018-12-23 NOTE — Telephone Encounter (Signed)
Patient is aware and said that he has already called [facility] pharmacy to have the Rx transferred to [facility].

## 2018-12-23 NOTE — Telephone Encounter (Signed)
I sent to [facility] initially where I was advised to sent  if he wants to [facility] he needs to call [facility] and Get Rx transferred there they can see the order from [facility]   Thanks [place]

## 2018-12-23 NOTE — Telephone Encounter (Signed)
Patient requesting a refill on levothyroxine 125 MCG tab.  Patient has not had a normal TSH lab within the last year.  Last TSH lab on 11/28/18 was abnormal.  Last OV was on 11/27/18.  Do you want to refill?

## 2018-12-24 ENCOUNTER — Encounter: Payer: Self-pay | Admitting: Internal Medicine

## 2019-02-13 ENCOUNTER — Encounter: Payer: Self-pay | Admitting: Internal Medicine

## 2019-02-17 NOTE — Addendum Note (Signed)
Addended by: Orland Mustard on: 02/17/2019 06:31 PM   Modules accepted: Orders

## 2019-02-27 ENCOUNTER — Ambulatory Visit (INDEPENDENT_AMBULATORY_CARE_PROVIDER_SITE_OTHER): Payer: 59

## 2019-02-27 ENCOUNTER — Ambulatory Visit: Payer: 59 | Admitting: Genetic Counselor

## 2019-02-27 ENCOUNTER — Ambulatory Visit: Payer: Self-pay | Admitting: Internal Medicine

## 2019-02-27 ENCOUNTER — Other Ambulatory Visit: Payer: Self-pay

## 2019-02-27 DIAGNOSIS — Z23 Encounter for immunization: Secondary | ICD-10-CM | POA: Diagnosis not present

## 2019-02-27 NOTE — Addendum Note (Signed)
Addended by: Gwenyth Ober R on: 02/27/2019 03:47 PM   Modules accepted: Orders

## 2019-03-03 ENCOUNTER — Other Ambulatory Visit (INDEPENDENT_AMBULATORY_CARE_PROVIDER_SITE_OTHER): Payer: 59

## 2019-03-03 ENCOUNTER — Other Ambulatory Visit: Payer: Self-pay

## 2019-03-03 DIAGNOSIS — E039 Hypothyroidism, unspecified: Secondary | ICD-10-CM

## 2019-03-03 DIAGNOSIS — Z0183 Encounter for blood typing: Secondary | ICD-10-CM

## 2019-03-03 DIAGNOSIS — R945 Abnormal results of liver function studies: Secondary | ICD-10-CM

## 2019-03-03 DIAGNOSIS — R7989 Other specified abnormal findings of blood chemistry: Secondary | ICD-10-CM

## 2019-03-03 LAB — HEPATIC FUNCTION PANEL
ALT: 95 U/L — ABNORMAL HIGH (ref 0–53)
AST: 36 U/L (ref 0–37)
Albumin: 4.4 g/dL (ref 3.5–5.2)
Alkaline Phosphatase: 54 U/L (ref 39–117)
Bilirubin, Direct: 0.1 mg/dL (ref 0.0–0.3)
Total Bilirubin: 1 mg/dL (ref 0.2–1.2)
Total Protein: 7.5 g/dL (ref 6.0–8.3)

## 2019-03-03 LAB — TSH: TSH: 1.92 u[IU]/mL (ref 0.35–4.50)

## 2019-03-04 LAB — ABO AND RH: Rh Factor: POSITIVE

## 2019-03-05 ENCOUNTER — Encounter: Payer: Self-pay | Admitting: Internal Medicine

## 2019-03-07 ENCOUNTER — Other Ambulatory Visit: Payer: Self-pay | Admitting: Internal Medicine

## 2019-03-07 DIAGNOSIS — Z1211 Encounter for screening for malignant neoplasm of colon: Secondary | ICD-10-CM

## 2019-03-09 NOTE — Progress Notes (Signed)
Pre Test [facility]  Referral Reason  Tarig Barrozo was referred for genetic consult for a familial cardiomyopathy.  Genetic Consultation Notes  Constantinos (III.5 on pedigree) is a pleasant African American gentleman who works as a Chemical engineer at [employer]. He reports no limitations and denies having symptoms of dyspnea, chest pains, or syncope.   Family history Since Rowland was not aware of his mother's heart condition, his family history was compiled with the help of his older sister, Big V (III.2) who spoke with [place] over the phone during this visit.  She informs me that their mother (II.7) had heart issues prior to the age of 73. She complained of shortness of breath, severe fatigue and had passed out on two occasions. She used to go to [place] for treatment. Last year her symptoms of shortness of breath and fatigue significantly worsened. She was treated at [facility] where she was diagnosed with obstructive HCM at age 5. A year later she was taken to the ER for sever chest pains and was placed on life support. As additional management strategies proved ineffective in improving her heart condition, she agreed to be taken off life support and passed away at age 36.   Issaih' mother is the third of 8 children. Her eldest sister (II.8) was also diagnosed with HCM at age 28. She passed away 10 years later. She has three children (III.8-III.10) who are currently in good health. Her other siblings are alive and do not have overt heart issues. They do mention a maternal cousin (III.12) who has heart disease, the details of which are not clear to Adisa and his siblings.  Moiz' maternal grandfather (I.3) had non-ischemic cardiomyopathy (NICM) at age 19, pacemaker at 26 and died of a stroke at 13.   Celvin (III.5) has 4 sisters (III.1-III.3, III.6) and two brothers (III.4, III.7). His 50 year-old sister (III.3) complains of chest pains and is managed by a cardiologist. They are not sure if she has HCM. Other than  Big V, he and his other siblings have not been screened for HCM. Ervin has two children; a healthy 64year old daughter (IV.24) and a 53 year old son (IV.2) who was found to have a heart murmur at birth  Johnson and his sister was counseled on the genetics of hypertrophic cardiomyopathy (HCM), namely its autosomal dominant inheritance. We also talked about incomplete penetrance, variable expression and digenic/compound mutations that can be seen in some patients with HCM. We briefly discussed the inheritance pattern and treatment plans for the infiltrative cardiomyopathies that present as HCM phenocopies.   Impression  In summary, there is a significant family history of HCM in Roberth' family. His mother and maternal  aunt were diagnosed with HCM in their 73s and passed away from this condition. In addition, his maternal grandfather had NICM. It is likely that his mother and her sister inherited the condition from their father. As HCM is an autosomal dominant condition, Bodey and his siblings are at a 50% risk of inheriting HCM.   However, as Collier is currently asymptomatic and has not been screened for HCM, he is not the right candidate for genetic testing of HCM. If, upon cardiac imaging, Jayne is found to have heart wall thickness indicative of HCM, then we can proceed with genetic testing for HCM. He verbalized understanding of this.   In addition, we discussed the protections afforded by the Genetic Information Non-Discrimination Act (GINA). I explained to him that GINA protects him from losing his employment  or health insurance based on his genotype. However, these protections do not cover life insurance and disability. He verbalized understanding of this.  Please note that the patient has not been counseled in this visit on personal, cultural or ethical issues that she may face due to her heart condition.   Plan Tarl will need to be screened for HCM by Echocardiogram and EKG prior to genetic testing  as genetic testing can be perfromed only on those individuals that have cardiac wall thickness suggestive of HCM. If his cardiac architecture looks normal, he does not need genetic testing at this time. However, as his mother was diagnosed with HCM, he should have regular surveillance for HCM.    Lattie Corns, Ph.D, [facility] Clinical Molecular Geneticist

## 2019-03-18 MED FILL — LEVOTHYROXINE 125 MCG TABLE: 125 | 90 days supply | Qty: 90 | Fill #1

## 2019-03-25 ENCOUNTER — Other Ambulatory Visit: Payer: Self-pay

## 2019-03-25 DIAGNOSIS — H524 Presbyopia: Secondary | ICD-10-CM | POA: Diagnosis not present

## 2019-03-25 DIAGNOSIS — H52223 Regular astigmatism, bilateral: Secondary | ICD-10-CM | POA: Diagnosis not present

## 2019-03-25 DIAGNOSIS — H5213 Myopia, bilateral: Secondary | ICD-10-CM | POA: Diagnosis not present

## 2019-03-27 ENCOUNTER — Other Ambulatory Visit: Payer: Self-pay

## 2019-03-27 ENCOUNTER — Ambulatory Visit: Payer: 59 | Admitting: Internal Medicine

## 2019-03-27 ENCOUNTER — Encounter: Payer: Self-pay | Admitting: Internal Medicine

## 2019-03-27 VITALS — BP 128/78 | HR 69 | Temp 98.0°F | Ht 75.0 in | Wt 229.6 lb

## 2019-03-27 DIAGNOSIS — Z13220 Encounter for screening for lipoid disorders: Secondary | ICD-10-CM

## 2019-03-27 DIAGNOSIS — I422 Other hypertrophic cardiomyopathy: Secondary | ICD-10-CM | POA: Insufficient documentation

## 2019-03-27 DIAGNOSIS — Z0001 Encounter for general adult medical examination with abnormal findings: Secondary | ICD-10-CM | POA: Diagnosis not present

## 2019-03-27 DIAGNOSIS — Z1329 Encounter for screening for other suspected endocrine disorder: Secondary | ICD-10-CM | POA: Diagnosis not present

## 2019-03-27 DIAGNOSIS — R7303 Prediabetes: Secondary | ICD-10-CM

## 2019-03-27 DIAGNOSIS — Z8249 Family history of ischemic heart disease and other diseases of the circulatory system: Secondary | ICD-10-CM | POA: Diagnosis not present

## 2019-03-27 DIAGNOSIS — E039 Hypothyroidism, unspecified: Secondary | ICD-10-CM

## 2019-03-27 DIAGNOSIS — E559 Vitamin D deficiency, unspecified: Secondary | ICD-10-CM

## 2019-03-27 DIAGNOSIS — Z0000 Encounter for general adult medical examination without abnormal findings: Secondary | ICD-10-CM

## 2019-03-27 DIAGNOSIS — R9431 Abnormal electrocardiogram [ECG] [EKG]: Secondary | ICD-10-CM

## 2019-03-27 NOTE — Progress Notes (Signed)
Chief Complaint  Patient presents with  . Follow-up   Annual doing well  1. Hypothyroidism controlled on levo 125 mcg  2. FH HCM genetics testing wanted EKG and echo before further gene testing  3. Vit D def on D3 50K IU 1x per week    Review of Systems  Constitutional: Positive for weight loss.       Down from 240s trying    HENT: Negative for hearing loss.   Eyes: Negative for blurred vision.  Respiratory: Negative for shortness of breath.   Cardiovascular: Negative for chest pain.  Gastrointestinal: Negative for abdominal pain.  Musculoskeletal: Negative for falls.  Skin: Negative for rash.  Neurological: Negative for headaches.  Psychiatric/Behavioral: Negative for depression.   Past Medical History:  Diagnosis Date  . FH: heart disease   . GERD (gastroesophageal reflux disease)   . Hyperthyroidism    s/p RAI 2009   . Hypothyroidism   . Verruca vulgaris    Past Surgical History:  Procedure Laterality Date  . NO PAST SURGERIES     Family History  Problem Relation Age of Onset  . Heart disease Mother   . Hypertension Mother   . Diabetes Mother    Social History   Socioeconomic History  . Marital status: Married    Spouse name: Not on file  . Number of children: Not on file  . Years of education: Not on file  . Highest education level: Not on file  Occupational History  . Not on file  Social Needs  . Financial resource strain: Not on file  . Food insecurity    Worry: Not on file    Inability: Not on file  . Transportation needs    Medical: Not on file    Non-medical: Not on file  Tobacco Use  . Smoking status: Never Smoker  . Smokeless tobacco: Never Used  Substance and Sexual Activity  . Alcohol use: Yes  . Drug use: Not on file  . Sexual activity: Not on file  Lifestyle  . Physical activity    Days per week: Not on file    Minutes per session: Not on file  . Stress: Not on file  Relationships  . Social Herbalist on phone: Not on  file    Gets together: Not on file    Attends religious service: Not on file    Active member of club or organization: Not on file    Attends meetings of clubs or organizations: Not on file    Relationship status: Not on file  . Intimate partner violence    Fear of current or ex partner: Not on file    Emotionally abused: Not on file    Physically abused: Not on file    Forced sexual activity: Not on file  Other Topics Concern  . 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 etoh    8 siblings   Current Meds  Medication Sig  . levothyroxine (SYNTHROID) 125 MCG tablet Take 1 tablet (125 mcg total) by mouth daily before breakfast.   No Known Allergies Recent Results (from the past 2160 hour(s))  ABO AND RH      Status: None   Collection Time: 03/03/19  1:45 PM  Result Value Ref Range   ABO Grouping  A    Rh Factor Positive     Comment: Please note: Prior records for this patient's ABO / Rh type are not available for additional verification.   TSH     Status: None   Collection Time: 03/03/19  1:45 PM  Result Value Ref Range   TSH 1.92 0.35 - 4.50 uIU/mL  Hepatic function panel     Status: Abnormal   Collection Time: 03/03/19  1:45 PM  Result Value Ref Range   Total Bilirubin 1.0 0.2 - 1.2 mg/dL   Bilirubin, Direct 0.1 0.0 - 0.3 mg/dL   Alkaline Phosphatase 54 39 - 117 U/L   AST 36 0 - 37 U/L   ALT 95 (H) 0 - 53 U/L   Total Protein 7.5 6.0 - 8.3 g/dL   Albumin 4.4 3.5 - 5.2 g/dL   Objective  Body mass index is 28.7 kg/m. Wt Readings from Last 3 Encounters:  03/27/19 229 lb 9.6 oz (104.1 kg)   Temp Readings from Last 3 Encounters:  03/27/19 98 F (36.7 C) (Oral)  04/04/16 98 F (36.7 C) (Oral)  09/23/15 98.8 F (37.1 C) (Oral)   BP Readings from Last 3 Encounters:  03/27/19 128/78  08/22/18 127/84  04/04/16 155/97   Pulse  Readings from Last 3 Encounters:  03/27/19 69  04/04/16 70  09/23/15 98    Physical Exam Vitals signs and nursing note reviewed.  Constitutional:      Appearance: Normal appearance. He is well-developed and well-groomed.     Comments: +mask on    HENT:     Head: Normocephalic and atraumatic.  Eyes:     Conjunctiva/sclera: Conjunctivae normal.     Pupils: Pupils are equal, round, and reactive to light.  Cardiovascular:     Rate and Rhythm: Normal rate and regular rhythm.     Heart sounds: Normal heart sounds. No murmur.  Pulmonary:     Effort: Pulmonary effort is normal.     Breath sounds: Normal breath sounds.  Abdominal:     General: Abdomen is flat. Bowel sounds are normal.     Tenderness: There is no abdominal tenderness.  Skin:    General: Skin is warm and dry.  Neurological:     General: No focal deficit present.     Mental Status: He is alert and oriented to person, place, and time. Mental status is at baseline.     Gait: Gait normal.  Psychiatric:        Attention and Perception: Attention and perception normal.        Mood and Affect: Mood and affect normal.        Speech: Speech normal.        Behavior: Behavior normal. Behavior is cooperative.        Thought Content: Thought content normal.        Cognition and Memory: Cognition and memory normal.        Judgment: Judgment normal.     Assessment  Plan  Annual physical exam  utd flu shot and Tdap had 02/27/19  rec twinrix with fatty liver and MMR   11/28/18 PSA 0.64 declines DRE for now 03/27/19 Colonoscopy referred [facility]  rec healthy diet and exercise  Hypertrophic cardiomyopathy ([place]) - Plan: EKG 12-Lead today with diffuse TWI w/o chest pain   ECHOCARDIOGRAM COMPLETE FH: heart disease - Plan: EKG 12-Lead, ECHOCARDIOGRAM COMPLETE If abnormal refer gene testing and EP cards   Hypothyroidism  -cont thyroid med and check in 6  months   Provider: Dr. Olivia Mackie McLean-[facility]

## 2019-03-27 NOTE — Patient Instructions (Addendum)
[facility] [place] colonoscopy   Twinrix 3 doses month 0, 1 and 6 in our office   MMR vaccine recommended pharmacy   Echo order in [place]    Measles/Mumps/Rubella Vaccines, MMR injection What is this medicine? MEASLES VIRUS; MUMPS VIRUS; RUBELLA VIRUS VACCINE LIVE (MEE zuhlz VAHY ruhs; muhmps VAHY ruhs; roo bel uh VAHY ruhs vak SEEN [place] ) is used to prevent an infection with measles (rubeola), mumps, and rubella ([place] measles) viruses. It is used to prevent infection in children over 46 months old, adults that have not been vaccinated and are not pregnant, and anyone traveling to countries where there are high rates of measles, mumps, or rubella. This medicine may be used for other purposes; ask your health care provider or pharmacist if you have questions. COMMON BRAND NAME(S): M-M-R II What should I tell my health care provider before I take this medicine? They need to know if you have any of these conditions:  bleeding disorder  cancer including leukemia or lymphoma  immune system problems  infection with fever  low levels of platelets in the blood  recent blood transfusion or immune globulin infusion  seizure disorder  taking medicines for immunosuppression  an unusual or allergic reaction to vaccines, eggs, neomycin, gelatin, other medicines, foods, dyes, or preservatives  pregnant or trying to get pregnant  breast-feeding How should I use this medicine? This vaccine is for injection under the skin. It is given by a health care professional. A copy of Vaccine Information Statements will be given before each vaccination. Read this sheet carefully each time. The sheet may change frequently. Talk to your pediatrician regarding the use of this medicine in children. While this drug may be prescribed for children as young as 64 months of age for selected conditions, precautions do apply. Overdosage: If you think you have taken too much of this medicine  contact a poison control center or emergency room at once. NOTE: This medicine is only for you. Do not share this medicine with others. What if I miss a dose? Keep appointments for follow-up (booster) doses as directed. It is important not to miss your dose. Call your doctor or health care professional if you are unable to keep an appointment. What may interact with this medicine? Do not take this medicine with any of the following medications:  adalimumab  anakinra  etanercept  infliximab  medicines that suppress your immune system  medicines to treat cancer This medicine may also interact with the following medications:  immune globulins  live virus vaccines This list may not describe all possible interactions. Give your health care provider a list of all the medicines, herbs, non-prescription drugs, or dietary supplements you use. Also tell them if you smoke, drink alcohol, or use illegal drugs. Some items may interact with your medicine. What should I watch for while using this medicine? Visit your doctor for check-ups as directed. Do not become pregnant for 3 months after receiving this vaccine. Women should inform their doctor if they wish to become pregnant or think they might be pregnant. There is a potential for serious side effects to an unborn child. Talk to your health care professional or pharmacist for more information. What side effects may I notice from receiving this medicine? Side effects that you should report to your doctor or health care professional as soon as possible:  allergic reactions like skin rash, itching or hives, swelling of the face, lips, or tongue  breathing  problems  changes in hearing  changes in vision  difficulty walking  extreme changes in behavior  fast, irregular heartbeat  fever over 100 degrees F  pain, tingling, numbness in the hands or feet  seizures  unusual bleeding or bruising  unusually weak or tired Side effects  that usually do not require medical attention (report to your doctor or health care professional if they continue or are bothersome):  aches or pains  bruising, pain, swelling at site where injected  diarrhea  headache  low-grade fever of 100 degrees F or less  nausea, vomiting  runny nose, cough  sleepy  swollen glands This list may not describe all possible side effects. Call your doctor for medical advice about side effects. You may report side effects to FDA at 1-800-FDA-1088. Where should I keep my medicine? This drug is given in a hospital or clinic and will not be stored at home. NOTE: This sheet is a summary. It may not cover all possible information. If you have questions about this medicine, talk to your doctor, pharmacist, or health care provider.  2020 Elsevier/Gold Standard (2013-07-04 11:04:43)  Hepatitis A; Hepatitis B Vaccine injection What is this medicine? HEPATITIS A VACCINE; HEPATITIS B VACCINE (hep uh TAHY tis A vak SEEN; hep uh TAHY tis B vak SEEN) is a vaccine to protect from an infection with the hepatitis A and B virus. This vaccine does not contain the live viruses. It will not cause a hepatitis infection. This medicine may be used for other purposes; ask your health care provider or pharmacist if you have questions. COMMON BRAND NAME(S): Twinrix What should I tell my health care provider before I take this medicine? They need to know if you have any of these conditions:  bleeding disorder  fever or infection  heart disease  immune system problems  an unusual or allergic reaction to hepatitis A or B vaccine, neomycin, yeast, thimerosal, other medicines, foods, dyes, or preservatives  pregnant or trying to get pregnant  breast-feeding How should I use this medicine? This vaccine is for injection into a muscle. It is given by a health care professional. A copy of Vaccine Information Statements will be given before each vaccination. Read this  sheet carefully each time. The sheet may change frequently. Talk to your pediatrician regarding the use of this medicine in children. Special care may be needed. Overdosage: If you think you have taken too much of this medicine contact a poison control center or emergency room at once. NOTE: This medicine is only for you. Do not share this medicine with others. What if I miss a dose? It is important not to miss your dose. Call your doctor or health care professional if you are unable to keep an appointment. What may interact with this medicine?  medicines that suppress your immune function like adalimumab, anakinra, infliximab  medicines to treat cancer  steroid medicines like prednisone or cortisone This list may not describe all possible interactions. Give your health care provider a list of all the medicines, herbs, non-prescription drugs, or dietary supplements you use. Also tell them if you smoke, drink alcohol, or use illegal drugs. Some items may interact with your medicine. What should I watch for while using this medicine? See your health care provider for all shots of this vaccine as directed. You must have 3 to 4 shots of this vaccine for protection from hepatitis A and B infection. Tell your doctor right away if you have any serious or unusual  side effects after getting this vaccine. What side effects may I notice from receiving this medicine? Side effects that you should report to your doctor or health care professional as soon as possible:  allergic reactions like skin rash, itching or hives, swelling of the face, lips, or tongue  breathing problems  confused, irritated  fast, irregular heartbeat  flu-like syndrome  numb, tingling pain  seizures Side effects that usually do not require medical attention (report to your doctor or health care professional if they continue or are bothersome):  diarrhea  fever  headache  loss of appetite  muscle  pain  nausea  pain, redness, swelling, or irritation at site where injected  tiredness This list may not describe all possible side effects. Call your doctor for medical advice about side effects. You may report side effects to FDA at 1-800-FDA-1088. Where should I keep my medicine? This drug is given in a hospital or clinic and will not be stored at home. NOTE: This sheet is a summary. It may not cover all possible information. If you have questions about this medicine, talk to your doctor, pharmacist, or health care provider.  2020 Elsevier/Gold Standard (2007-10-18 41:93:79)   Nonalcoholic Fatty Liver Disease Diet, Adult Nonalcoholic fatty liver disease is a condition that causes fat to build up in and around the liver. The disease makes it harder for the liver to work the way that it should. Following a healthy diet can help to keep nonalcoholic fatty liver disease under control. It can also help to prevent or improve conditions that are associated with the disease, such as heart disease, diabetes, high blood pressure, and abnormal cholesterol levels. Along with regular exercise, this diet:  Promotes weight loss.  Helps to control blood sugar levels.  Helps to improve the way that the body uses insulin. What are tips for following this plan? Reading food labels Always check food labels for:  The amount of saturated fat in a food. You should limit your intake of saturated fat. Saturated fat is found in foods that come from animals, including meat and dairy products such as butter, cheese, and whole milk.  The amount of fiber in a food. You should choose high-fiber foods such as fruits, vegetables, and whole grains. Try to get 25-30 grams (g) of fiber a day.  Cooking  When cooking, use heart-healthy oils that are high in monounsaturated fats. These include olive oil, canola oil, and avocado oil.  Limit frying or deep-frying foods. Cook foods using healthy methods such as baking,  boiling, steaming, and grilling instead. Meal planning  You may want to keep track of how many calories you take in. Eating the right amount of calories will help you achieve a healthy weight. Meeting with a registered dietitian can help you get started.  Limit how often you eat takeout and fast food. These foods are usually very high in fat, salt, and sugar.  Use the glycemic index (GI) to plan your meals. The index tells you how quickly a food will raise your blood sugar. Choose low-GI foods (GI less than 55). These foods take a longer time to raise blood sugar. A registered dietitian can help you identify foods lower on the GI scale. Lifestyle  You may want to follow a Mediterranean diet. This diet includes a lot of vegetables, lean meats or fish, whole grains, fruits, and healthy oils and fats. What foods can I eat?  Fruits Bananas. Apples. Oranges. Grapes. Papaya. Mango. Pomegranate. Kiwi. Grapefruit. Cherries. Vegetables  Lettuce. Spinach. Peas. Beets. Cauliflower. Cabbage. Broccoli. Carrots. Tomatoes. Squash. Eggplant. Herbs. Peppers. Onions. Cucumbers. Brussels sprouts. Yams and sweet potatoes. Beans. Lentils. Grains Whole wheat or whole-grain foods, including breads, crackers, cereals, and pasta. Stone-ground whole wheat. Unsweetened oatmeal. Bulgur. Barley. Quinoa. Brown or wild rice. Corn or whole wheat flour tortillas. Meats and other proteins Lean meats. Poultry. Tofu. Seafood and shellfish. Dairy Low-fat or fat-free dairy products, such as yogurt, cottage cheese, or cheese. Beverages Water. Sugar-free drinks. Tea. Coffee. Low-fat or skim milk. Milk alternatives, such as soy or almond milk. Real fruit juice. Fats and oils Avocado. Canola or olive oil. Nuts and nut butters. Seeds. Seasonings and condiments Mustard. Relish. Low-fat, low-sugar ketchup and barbecue sauce. Low-fat or fat-free mayonnaise. Sweets and desserts Sugar-free sweets. The items listed above may not be a  complete list of foods and beverages you can eat. Contact a dietitian for more information. What foods should I limit or avoid? Meats and other proteins Limit red meat to 1-2 times a week. Dairy [employer]. Fats and oils Palm oil and coconut oil. Fried foods. Other foods Processed foods. Foods that contain a lot of salt or sodium. Sweets and desserts Sweets that contain sugar. Beverages Sweetened drinks, such as sweet tea, milkshakes, iced sweet drinks, and sodas. Alcohol. The items listed above may not be a complete list of foods and beverages you should avoid. Contact a dietitian for more information. Where to find more information The [employer] of Diabetes and Digestive and Kidney Diseases: [email or web address] Summary  Nonalcoholic fatty liver disease is a condition that causes fat to build up in and around the liver.  Following a healthy diet can help to keep nonalcoholic fatty liver disease under control. Your diet should be rich in fruits, vegetables, whole grains, and lean proteins.  Limit your intake of saturated fat. Saturated fat is found in foods that come from animals, including meat and dairy products such as butter, cheese, and whole milk.  This diet promotes weight loss, helps to control blood sugar levels, and helps to improve the way that the body uses insulin. This information is not intended to replace advice given to you by your health care provider. Make sure you discuss any questions you have with your health care provider. Document Released: 10/20/2014 Document Revised: 09/27/2018 Document Reviewed: 06/27/2018 Elsevier Patient Education  2020 [place].  Fatty Liver Disease  Fatty liver disease occurs when too much fat has built up in your liver cells. Fatty liver disease is also called hepatic steatosis or steatohepatitis. The liver removes harmful substances from your bloodstream and produces fluids that your body needs. It also helps your body use  and store energy from the food you eat. In many cases, fatty liver disease does not cause symptoms or problems. It is often diagnosed when tests are being done for other reasons. However, over time, fatty liver can cause inflammation that may lead to more serious liver problems, such as scarring of the liver (cirrhosis) and liver failure. Fatty liver is associated with insulin resistance, increased body fat, high blood pressure (hypertension), and high cholesterol. These are features of metabolic syndrome and increase your risk for stroke, diabetes, and heart disease. What are the causes? This condition may be caused by:  Drinking too much alcohol.  Poor nutrition.  Obesity.  Cushing's syndrome.  Diabetes.  High cholesterol.  Certain drugs.  Poisons.  Some viral infections.  Pregnancy. What increases the risk? You are more likely to develop this condition if  you:  Abuse alcohol.  Are overweight.  Have diabetes.  Have hepatitis.  Have a high triglyceride level.  Are pregnant. What are the signs or symptoms? Fatty liver disease often does not cause symptoms. If symptoms do develop, they can include:  Fatigue.  Weakness.  Weight loss.  Confusion.  Abdominal pain.  Nausea and vomiting.  Yellowing of your skin and the white parts of your eyes (jaundice).  Itchy skin. How is this diagnosed? This condition may be diagnosed by:  A physical exam and medical history.  Blood tests.  Imaging tests, such as an ultrasound, CT scan, or MRI.  A liver biopsy. A small sample of liver tissue is removed using a needle. The sample is then looked at under a microscope. How is this treated? Fatty liver disease is often caused by other health conditions. Treatment for fatty liver may involve medicines and lifestyle changes to manage conditions such as:  Alcoholism.  High cholesterol.  Diabetes.  Being overweight or obese. Follow these instructions at home:   Do  not drink alcohol. If you have trouble quitting, ask your health care provider how to safely quit with the help of medicine or a supervised program. This is important to keep your condition from getting worse.  Eat a healthy diet as told by your health care provider. Ask your health care provider about working with a diet and nutrition specialist (dietitian) to develop an eating plan.  Exercise regularly. This can help you lose weight and control your cholesterol and diabetes. Talk to your health care provider about an exercise plan and which activities are best for you.  Take over-the-counter and prescription medicines only as told by your health care provider.  Keep all follow-up visits as told by your health care provider. This is important. Contact a health care provider if: You have trouble controlling your:  Blood sugar. This is especially important if you have diabetes.  Cholesterol.  Drinking of alcohol. Get help right away if:  You have abdominal pain.  You have jaundice.  You have nausea and vomiting.  You vomit blood or material that looks like coffee grounds.  You have stools that are black, tar-like, or bloody. Summary  Fatty liver disease develops when too much fat builds up in the cells of your liver.  Fatty liver disease often causes no symptoms or problems. However, over time, fatty liver can cause inflammation that may lead to more serious liver problems, such as scarring of the liver (cirrhosis).  You are more likely to develop this condition if you abuse alcohol, are pregnant, are overweight, have diabetes, have hepatitis, or have high triglyceride levels.  Contact your health care provider if you have trouble controlling your weight, blood sugar, cholesterol, or drinking of alcohol. This information is not intended to replace advice given to you by your health care provider. Make sure you discuss any questions you have with your health care  provider. Document Released: 07/21/2005 Document Revised: 05/18/2017 Document Reviewed: 03/14/2017 Elsevier Patient Education  2020 [employer].

## 2019-03-28 ENCOUNTER — Encounter: Payer: Self-pay | Admitting: Internal Medicine

## 2019-03-28 ENCOUNTER — Ambulatory Visit (HOSPITAL_COMMUNITY): Payer: 59 | Attending: Cardiology

## 2019-03-28 ENCOUNTER — Other Ambulatory Visit: Payer: Self-pay | Admitting: Internal Medicine

## 2019-03-28 DIAGNOSIS — R9431 Abnormal electrocardiogram [ECG] [EKG]: Secondary | ICD-10-CM

## 2019-03-28 DIAGNOSIS — I517 Cardiomegaly: Secondary | ICD-10-CM

## 2019-03-28 DIAGNOSIS — Z8249 Family history of ischemic heart disease and other diseases of the circulatory system: Secondary | ICD-10-CM | POA: Insufficient documentation

## 2019-03-28 DIAGNOSIS — I422 Other hypertrophic cardiomyopathy: Secondary | ICD-10-CM | POA: Insufficient documentation

## 2019-03-28 NOTE — Progress Notes (Signed)
[facility] called him the same day that we sent it over to them and lvm for him to rtc.

## 2019-04-08 ENCOUNTER — Encounter: Payer: Self-pay | Admitting: Gastroenterology

## 2019-04-10 DIAGNOSIS — R931 Abnormal findings on diagnostic imaging of heart and coronary circulation: Secondary | ICD-10-CM | POA: Insufficient documentation

## 2019-04-10 NOTE — Progress Notes (Signed)
Cardiology Office Note   Date:  04/11/2019   ID:  Douglas Harper, DOB [birth year], MRN [id number]  PCP:  McLean-Scocuzza, Nino Glow, MD  Cardiologist:   No primary care provider on file. Referring:  Dr Broadus John  No chief complaint on file.     History of Present Illness: Douglas Harper is a 48 y.o. male who presents for screening for possible HCM.  His mother and maternal  aunt were diagnosed with HCM in their 61s and passed away from this condition.He saw Dr. Broadus John and it was suggested that he have an echo to try to rule out or diagnose HCM before doing any genetic testing. He did have an echo and there was some mildly increased LV wall thickness.  There was some mild septal thickening but overall it seemed to be slightly diffuse.  There were no valvular abnormalities.  Results are completely listed below.  The patient thought that there might have been some genetic testing done with his family but we do not have any documentation of this.  He himself has had no prior diagnosis.  He feels well.  He is active.  He can play basketball and do other activities without limitations.  The patient denies any new symptoms such as chest discomfort, neck or arm discomfort. There has been no new shortness of breath, PND or orthopnea. There has been no presyncope or syncope.   He does have occasional palpitations.  He says this may happen a couple of times a year.  He feels heart racing but is only for a few seconds.  He does not have presyncope or syncope.    Past Medical History:  Diagnosis Date  . FH: heart disease   . GERD (gastroesophageal reflux disease)   . Hyperthyroidism    s/p RAI 2009   . Hypothyroidism   . Verruca vulgaris     Past Surgical History:  Procedure Laterality Date  . NO PAST SURGERIES       Current Outpatient Medications  Medication Sig Dispense Refill  . levothyroxine (SYNTHROID) 125 MCG tablet Take 1 tablet (125 mcg total) by mouth daily before breakfast. 90 tablet 1    No current facility-administered medications for this visit.     Allergies:   Patient has no known allergies.    Social History:  The patient  reports that he has never smoked. He has never used smokeless tobacco. He reports current alcohol use.   Family History:  The patient's family history includes Diabetes in his mother; Heart disease in his mother; Hypertension in his mother.      ROS:  Please see the history of present illness.   Otherwise, review of systems are positive for none.   All other systems are reviewed and negative.    PHYSICAL EXAM: VS:  BP 114/73   Pulse 64   Ht 6\' 4"  (1.93 m)   Wt 226 lb 12.8 oz (102.9 kg)   SpO2 98%   BMI 27.61 kg/m  , BMI Body mass index is 27.61 kg/m. GENERAL:  Well appearing HEENT:  Pupils equal round and reactive, fundi not visualized, oral mucosa unremarkable NECK:  No jugular venous distention, waveform within normal limits, carotid upstroke brisk and symmetric, no bruits, no thyromegaly LYMPHATICS:  No cervical, inguinal adenopathy LUNGS:  Clear to auscultation bilaterally BACK:  No CVA tenderness CHEST:  Unremarkable HEART:  PMI not displaced or sustained,S1 and S2 within normal limits, no S3, no S4, no clicks, no rubs, no  murmurs ABD:  Flat, positive bowel sounds normal in frequency in pitch, no bruits, no rebound, no guarding, no midline pulsatile mass, no hepatomegaly, no splenomegaly EXT:  2 plus pulses throughout, no edema, no cyanosis no clubbing SKIN:  No rashes no nodules NEURO:  Cranial nerves II through XII grossly intact, motor grossly intact throughout PSYCH:  Cognitively intact, oriented to person place and time  ECHO:   1. Left ventricular ejection fraction, by visual estimation, is 60 to 65%. The left ventricle has normal function. Normal left ventricular size. Left ventricular septal wall thickness was mildly increased. Mildly increased left ventricular posterior wall thickness. There is mildly increased left  ventricular hypertrophy. 2. Left ventricular diastolic Doppler parameters are consistent with pseudonormalization pattern of LV diastolic filling. 3. Global right ventricle has normal systolic function.The right ventricular size is normal. No increase in right ventricular wall thickness. 4. Left atrial size was mildly dilated. 5. Right atrial size was normal. 6. The mitral valve is normal in structure. No evidence of mitral valve regurgitation. No evidence of mitral stenosis. 7. The tricuspid valve is normal in structure. Tricuspid valve regurgitation was not visualized by color flow Doppler. 8. The aortic valve is tricuspid Aortic valve regurgitation was not visualized by color flow Doppler. Structurally normal aortic valve, with no evidence of sclerosis or stenosis. 9. The pulmonic valve was normal in structure. Pulmonic valve regurgitation is trivial by color flow Doppler. 10. TR signal is inadequate for assessing pulmonary artery systolic pressure. 11. The inferior vena cava is normal in size with greater than 50% respiratory variability, suggesting right atrial pressure of 3 mmHg. 12. Reduced tissue doppler velocities and enlarged left atrium support grade II diastolic dysfunction as opposed to normal for age.   EKG:  EKG is not ordered today. The ekg ordered 03/27/19 demonstrates normal sinus rhythm, rate 64, axis within normal limits, intervals within normal limits, evidence of left ventricular hypertrophy with repolarization   Recent Labs: 11/28/2018: BUN 11; Creatinine, Ser 1.21; Hemoglobin 13.5; Platelets 156.0; Potassium 4.3; Sodium 138 03/03/2019: ALT 95; TSH 1.92    Lipid Panel    Component Value Date/Time   CHOL 186 11/28/2018 1420   TRIG 76.0 11/28/2018 1420   HDL 43.10 11/28/2018 1420   CHOLHDL 4 11/28/2018 1420   VLDL 15.2 11/28/2018 1420   LDLCALC 127 (H) 11/28/2018 1420      Wt Readings from Last 3 Encounters:  04/11/19 226 lb 12.8 oz (102.9 kg)  03/27/19  229 lb 9.6 oz (104.1 kg)      Other studies Reviewed: Additional studies/ records that were reviewed today include: Dr. Delene Loll note. Review of the above records demonstrates:  Please see elsewhere in the note.     ASSESSMENT AND PLAN:  FAMILY HISTORY OF HCM:   The patient does have an abnormal echo but that is not diagnostic.  He has an abnormal EKG as above.  He needs a cardiac MRI with gadolinium enhancement.  He would then need routine follow-up with frequency to determine pending the results.  We will have risk stratification if there is any MRI indication that he might have hypertrophy.  We will can have him check back with his family to see if there has been any gene identified and I called and talked to Dr. Broadus John about this today.  The patient understands and will contact his family.   Current medicines are reviewed at length with the patient today.  The patient does not have concerns regarding medicines.  The  following changes have been made:  no change  Labs/ tests ordered today include:   Orders Placed This Encounter  Procedures  . MR Card Morphology Wo/W Cm  . Basic metabolic panel     Disposition:   FU with me in one year or sooner based on the results of the MRI.      Signed, Minus Breeding, MD  04/11/2019 1:05 PM     [facility]

## 2019-04-11 ENCOUNTER — Other Ambulatory Visit: Payer: Self-pay

## 2019-04-11 ENCOUNTER — Encounter: Payer: Self-pay | Admitting: Cardiology

## 2019-04-11 ENCOUNTER — Ambulatory Visit: Payer: 59 | Admitting: Cardiology

## 2019-04-11 VITALS — BP 114/73 | HR 64 | Ht 76.0 in | Wt 226.8 lb

## 2019-04-11 DIAGNOSIS — R931 Abnormal findings on diagnostic imaging of heart and coronary circulation: Secondary | ICD-10-CM

## 2019-04-11 DIAGNOSIS — Z01812 Encounter for preprocedural laboratory examination: Secondary | ICD-10-CM | POA: Diagnosis not present

## 2019-04-11 DIAGNOSIS — I421 Obstructive hypertrophic cardiomyopathy: Secondary | ICD-10-CM | POA: Diagnosis not present

## 2019-04-11 NOTE — Patient Instructions (Signed)
Medication Instructions:  Your physician recommends that you continue on your current medications as directed. Please refer to the Current Medication list given to you today.  If you need a refill on your cardiac medications before your next appointment, please call your pharmacy.   Lab work: BMET If you have labs (blood work) drawn today and your tests are completely normal, you will receive your results only by: [place] (if you have MyChart) OR A paper copy in the mail If you have any lab test that is abnormal or we need to change your treatment, we will call you to review the results.  Testing/Procedures: Your physician has requested that you have a cardiac MRI. Cardiac MRI uses a computer to create images of your heart as its beating, producing both still and moving pictures of your heart and major blood vessels. For further information please visit [email or web address] Please follow the instruction sheet given to you today for more information.  Follow-Up: At [facility], you and your health needs are our priority.  As part of our continuing mission to provide you with exceptional heart care, we have created designated Provider Care Teams.  These Care Teams include your primary Cardiologist (physician) and Advanced Practice Providers (APPs -  Physician Assistants and Nurse Practitioners) who all work together to provide you with the care you need, when you need it. You may see Dr. Percival Spanish or one of the following Advanced Practice Providers on your designated Care Team:    Rosaria Ferries, PA-C  Jory Sims, DNP, ANP  Cadence Kathlen Mody, NP Your physician wants you to follow-up in: 1 year. You will receive a reminder letter in the mail two months in advance. If you don't receive a letter, please call our office to schedule the follow-up appointment.    Nuclear Medicine Exam A nuclear medicine exam is a safe and painless imaging test. It helps your health care provider  detect and diagnose diseases. It also provides information about the ways your organs work and how they are structured. For a nuclear medicine exam, you will be given a radioactive tracer. This substance is absorbed by your body's organs. A large scanning machine detects the tracer and creates pictures of the areas that your health care provider wants to know more about. There are several kinds of nuclear medicine exams. They include the following:  CT scan.  MRI scan.  PET scan.  SPECT scan. Tell your health care provider about:  Any allergies you have.  All medicines you are taking, including vitamins, herbs, eye drops, creams, and over-the-counter medicines.  Any problems you or family members have had with anesthetic medicines.  Any blood disorders you have.  Any surgeries you have had.  Any medical conditions you have.  Whether you are pregnant or may be pregnant.  Whether you are nursing. What are the risks? Generally, this is a safe procedure. However, problems may occur, such as an allergic reaction to the tracer, but this is rare. What happens before the procedure? Medicines Ask your health care provider about:  Changing or stopping your regular medicines. This is especially important if you are taking diabetes medicines or blood thinners.  Taking medicines such as aspirin and ibuprofen. These medicines can thin your blood. Do not take these medicines unless your health care provider tells you to take them.  Taking over-the-counter medicines, vitamins, herbs, and supplements. General instructions  Follow instructions from your health care provider about eating or drinking restrictions.  Do not wear jewelry.  Wear loose, comfortable clothing. You may be asked to wear a hospital gown for the procedure.  Bring previous imaging studies, such as X-rays, with you to the exam if they are available. What happens during the procedure?   An IV may be inserted into  one of your veins.  You will be asked to lie on a table or sit in a chair.  You will be given the radioactive tracer. You may get: ? A pill or liquid to swallow. ? An injection. ? Medicine through your IV. ? A gas to inhale.  A large scanning machine will be used to create images of your body. After the pictures are taken, you may have to wait so your health care provider can make sure that enough images were taken. The procedure may vary among health care providers and hospitals. What happens after the procedure?  You may go home after the procedure and return to your usual activities, unless your health care provider tells you otherwise.  Drink enough water to keep your urine pale yellow. This helps to remove the radioactive tracer from your body.  It is up to you to get the results of your procedure. Ask your health care provider, or the department that is doing the procedure, when your results will be ready.  Get help right away if you have problems breathing. Summary  A nuclear medicine exam is a safe and painless imaging test that provides information about how your organs are working. It is also used to detect and diagnose diseases of various body organs.  Follow your health care provider's instructions about eating and drinking restrictions. Ask whether you should change or stop any medicines.  During the procedure, you will be given a radioactive tracer. A large scanning machine will create images of your body.  You may go home after the procedure and return to your regular activities. Follow your health care provider's instructions.  Get help right away if you have problems breathing. This information is not intended to replace advice given to you by your health care provider. Make sure you discuss any questions you have with your health care provider. Document Released: 07/13/2004 Document Revised: 04/24/2018 Document Reviewed: 04/24/2018 Elsevier Patient Education  2020  [employer].

## 2019-04-12 LAB — BASIC METABOLIC PANEL
BUN/Creatinine Ratio: 13 (ref 9–20)
BUN: 15 mg/dL (ref 6–24)
CO2: 24 mmol/L (ref 20–29)
Calcium: 9.4 mg/dL (ref 8.7–10.2)
Chloride: 101 mmol/L (ref 96–106)
Creatinine, Ser: 1.18 mg/dL (ref 0.76–1.27)
GFR calc Af Amer: 83 mL/min/{1.73_m2} (ref >59)
GFR calc non Af Amer: 72 mL/min/{1.73_m2} (ref >59)
Glucose: 87 mg/dL (ref 65–99)
Potassium: 4.4 mmol/L (ref 3.5–5.2)
Sodium: 138 mmol/L (ref 134–144)

## 2019-04-16 ENCOUNTER — Telehealth: Payer: Self-pay | Admitting: *Deleted

## 2019-04-16 ENCOUNTER — Encounter: Payer: Self-pay | Admitting: Cardiology

## 2019-04-16 NOTE — Telephone Encounter (Signed)
Spoke with patient regarding appointment for Cardiac MRI scheduled for 04/28/19 at 3:00pm---arrival time is 2:15pm at the 1st floor admissions office at [facility].  Will also mail letter to patient with this information

## 2019-04-25 ENCOUNTER — Telehealth (HOSPITAL_COMMUNITY): Payer: Self-pay | Admitting: Emergency Medicine

## 2019-04-25 NOTE — Telephone Encounter (Signed)
Reaching out to patient to offer assistance regarding upcoming cardiac imaging study; pt verbalizes understanding of appt date/time, parking situation and where to check in, and verified current allergies; name and call back number provided for further questions should they arise Marchia Bond RN Navigator Cardiac Imaging [facility] [phone] office [phone] cell  Pt denies implanted devices or insulin pumps, denies claustrophobia

## 2019-04-28 ENCOUNTER — Other Ambulatory Visit: Payer: Self-pay

## 2019-04-28 ENCOUNTER — Ambulatory Visit (HOSPITAL_COMMUNITY)
Admission: RE | Admit: 2019-04-28 | Discharge: 2019-04-28 | Disposition: A | Discharge status: Home / Self Care | Payer: 59 | Source: Ambulatory Visit | Attending: Cardiology | Admitting: Cardiology

## 2019-04-28 DIAGNOSIS — I422 Other hypertrophic cardiomyopathy: Secondary | ICD-10-CM | POA: Diagnosis not present

## 2019-04-28 DIAGNOSIS — I421 Obstructive hypertrophic cardiomyopathy: Secondary | ICD-10-CM | POA: Diagnosis not present

## 2019-04-28 MED ORDER — GADOBUTROL 1 MMOL/ML IV SOLN
12.0000 mL | Freq: Once | INTRAVENOUS | Status: AC | PRN
  Administered 2019-04-28: 16:00:00 12 mL via INTRAVENOUS

## 2019-05-02 ENCOUNTER — Telehealth: Payer: Self-pay | Admitting: *Deleted

## 2019-05-02 DIAGNOSIS — I421 Obstructive hypertrophic cardiomyopathy: Secondary | ICD-10-CM

## 2019-05-02 NOTE — Telephone Encounter (Signed)
Advised patient and message sent to Dr Cyndie Mull so kit could be mailed to patient.

## 2019-05-02 NOTE — Telephone Encounter (Signed)
-----  Message from Minus Breeding, MD sent at 05/02/2019  8:12 AM EST ----- Regarding: FW: Test order in EPIC This guy has HCM and needs the genetic testing ----- Message ----- From: Debbe Mounts, PhD Sent: 05/01/2019   2:08 PM EST To: Minus Breeding, MD Subject: Test order in [facility], We will send him the saliva kit since the Dec clinic is booked up. To place the order, you would need to go to lab orders, select "Cohesion" and then pick HCM. It seems to be a fairly simple process as most providers have not emailed me back :)

## 2019-05-05 ENCOUNTER — Telehealth: Payer: Self-pay | Admitting: *Deleted

## 2019-05-05 DIAGNOSIS — I421 Obstructive hypertrophic cardiomyopathy: Secondary | ICD-10-CM

## 2019-05-05 NOTE — Telephone Encounter (Signed)
Orders placed and message sent to scheduling to arrange GXT and monitor

## 2019-05-05 NOTE — Telephone Encounter (Signed)
-----   Message from Cristopher Estimable, RN sent at 05/05/2019  7:57 AM EST -----  ----- Message ----- From: Minus Breeding, MD Sent: 05/02/2019   8:47 AM EST To: Cristopher Estimable, RN  Discussed with the patient and with Dr. Broadus John.  He needs a POET (Plain Old Exercise Treadmill) and 3 day Holter.  We need to screen with these tests for high risk findings with his HCM.  He is to have genetic testing as well.  I would like to see him back in six months.  Call Mr. Frigo with the results and send results to McLean-Scocuzza, Nino Glow, MD

## 2019-05-06 ENCOUNTER — Encounter: Payer: Self-pay | Admitting: Cardiology

## 2019-05-06 NOTE — Telephone Encounter (Signed)
Patient scheduled for GXT 12/3 and Covid screening 11/30

## 2019-05-12 ENCOUNTER — Telehealth: Payer: Self-pay

## 2019-05-12 ENCOUNTER — Ambulatory Visit (INDEPENDENT_AMBULATORY_CARE_PROVIDER_SITE_OTHER): Payer: 59 | Admitting: Gastroenterology

## 2019-05-12 ENCOUNTER — Encounter: Payer: Self-pay | Admitting: Gastroenterology

## 2019-05-12 ENCOUNTER — Other Ambulatory Visit: Payer: Self-pay

## 2019-05-12 VITALS — BP 110/70 | HR 76 | Temp 97.4°F | Ht 75.0 in | Wt 232.8 lb

## 2019-05-12 DIAGNOSIS — Z1211 Encounter for screening for malignant neoplasm of colon: Secondary | ICD-10-CM

## 2019-05-12 DIAGNOSIS — Z1159 Encounter for screening for other viral diseases: Secondary | ICD-10-CM

## 2019-05-12 DIAGNOSIS — R0989 Other specified symptoms and signs involving the circulatory and respiratory systems: Secondary | ICD-10-CM

## 2019-05-12 MED ORDER — SUPREP BOWEL PREP KIT 17.5-3.13-1.6 GM/177ML PO SOLN: ORAL | 0 refills | Status: DC

## 2019-05-12 MED ORDER — PANTOPRAZOLE SODIUM 40 MG PO TBEC: 40.0000 mg | DELAYED_RELEASE_TABLET | Freq: Every day | ORAL | 3 refills | Status: DC

## 2019-05-12 MED FILL — PANTOPRAZOLE SOD DR 40 MG T: 40 | 30 days supply | Qty: 30 | Fill #0

## 2019-05-12 MED FILL — SUPREP BOWEL PREP KIT: 17.5-3.13-1 | 1 days supply | Qty: 354 | Fill #0

## 2019-05-12 NOTE — Progress Notes (Signed)
Referring Provider: McLean-Scocuzza, Olivia Mackie * Primary Care Physician:  McLean-Scocuzza, Nino Glow, MD  Reason for Consultation: Referred for colonoscopy  IMPRESSION:  Need for colon cancer screening Reflux Globus H pylori Antibody negative 11/28/18 Abnormal liver enzymes - Elevated ALT Echogenic liver on ultrasound   Reflux with globus: Globus without associated pain, lateralization of the symptoms, dysphagia, odynophagia, weight loss, a change in voice, presence of a neck or tonsillar mass, or unexplained cervical adenopathy. EGD recommended to evaluate for reflux esophagitis, gastric inlet pouch in the proximal esophagus, and eosinophilic esophagitis as the cause of symptoms.  If upper endoscopy is negative would consider esophageal manometry, esophageal impedance, and pH testing to exclude esophageal motility disorders. Trial of daily PPI therapy. Consider amitriptyline 25 mg daily if no response to PPI.  Need for colon cancer screening: Options discussed. Will proceed with colonoscopy.   Echogenic liver with elevated ALT: Suspected non-alcoholic fatty liver disease. Must exclude other chronic causes of hepatocellular inflammation that can mimic fatty liver on ultrasound. Will confirm prior evaluate with Dr. Aundra Dubin. If not previously performed will recommend: hepatitis C antibody, hepatitis B surface antigen, hepatitis B cord antibody, iron, ferritin, total iron binding capacity, IgG, ANA, Antismooth muscle antibody. If those tests are negative, additional evaluation with celiac and alpha-one-antitrypsin level could be pursued.     PLAN: Pantoprazole 40 mg daily EGD Colonoscopy  The nature of the procedure, as well as the risks, benefits, and alternatives were carefully and thoroughly reviewed with the patient. Ample time for discussion and questions allowed. The patient understood, was satisfied, and agreed to proceed.  Please see the "Patient Instructions" section for addition  details about the plan.  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 Referred by Dr. Aundra Dubin for reflux and globus. The history is obtained through the patient and review of his electronic health record.  He has hypothyroidism controlled on levothyroxine, glaucoma,  reflux, vitamin D deficiency.  He has some swallowing difficulty that he attributes to his thyroid.  Constant sense of globus at the right neck x years.  No dysphagia or odynophagia. Tolerates solids, liquids, and pills.  No abdominal pain or regurgitation.  Heartburn with spicy foods. Chest pain that he attributes to PVC. Followed by cardiology.  No change in bowel habits. No blood in the stool.  Good appetite. Weight stable.  No sore throat or neck pain.   No prior colon cancer screening. No prior endoscopic evaluation.   Labs 11/28/18: TB 1.5, AST 39, ALT 66, alk phos 54, hemoglobin 13.5, MCV 63.9, RDW 16.8, platelets 156 Labs 03/03/19: TB 1.0, AST 36, ALT 95, alk phos 54  An abdominal ultrasound 12/23/2018 was obtained to evaluate abnormal liver enzymes.  It showed an echogenic liver but was otherwise normal.  No known family history of colon cancer or polyps. No family history of uterine/endometrial cancer, pancreatic cancer or gastric/stomach cancer.   Past Medical History:  Diagnosis Date  . FH: heart disease   . GERD (gastroesophageal reflux disease)   . Hyperthyroidism    s/p RAI 2009   . Hypothyroidism   . Verruca vulgaris     Past Surgical History:  Procedure Laterality Date  . NO PAST SURGERIES      Current Outpatient Medications  Medication Sig Dispense Refill  . levothyroxine (SYNTHROID) 125 MCG tablet Take 1 tablet (125 mcg total) by mouth daily before breakfast. 90 tablet 1   No current facility-administered medications for this visit.  Allergies as of 05/12/2019  . (No Known Allergies)    Family History  Problem Relation Age of Onset  . Heart disease Mother   .  Hypertension Mother   . Diabetes Mother     Social History   Socioeconomic History  . Marital status: Married    Spouse name: Not on file  . Number of children: Not on file  . Years of education: Not on file  . Highest education level: Not on file  Occupational History  . Not on file  Social Needs  . Financial resource strain: Not on file  . Food insecurity    Worry: Not on file    Inability: Not on file  . Transportation needs    Medical: Not on file    Non-medical: Not on file  Tobacco Use  . Smoking status: Never Smoker  . Smokeless tobacco: Never Used  Substance and Sexual Activity  . Alcohol use: Yes  . Drug use: Not on file  . Sexual activity: Not on file  Lifestyle  . Physical activity    Days per week: Not on file    Minutes per session: Not on file  . Stress: Not on file  Relationships  . Social Herbalist on phone: Not on file    Gets together: Not on file    Attends religious service: Not on file    Active member of club or organization: Not on file    Attends meetings of clubs or organizations: Not on file    Relationship status: Not on file  . Intimate partner violence    Fear of current or ex partner: Not on file    Emotionally abused: Not on file    Physically abused: Not on file    Forced sexual activity: Not on file  Other Topics Concern  . 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 etoh    8 siblings    Review of Systems: 12 system ROS is negative except as noted above except for back pain that he manages with a brace.   Physical Exam: General:   Alert,  well-nourished, pleasant and cooperative in NAD Head:  Normocephalic and atraumatic. Eyes:  Sclera clear, no icterus.   Conjunctiva pink. Ears:  Normal auditory acuity. Nose:  No deformity, discharge,  or lesions. Mouth:  No deformity or  lesions.   Neck:  Supple; no masses or thyromegaly. Lungs:  Clear throughout to auscultation.   No wheezes. Heart:  Regular rate and rhythm; no murmurs. Abdomen:  Soft, nontender, nondistended, normal bowel sounds, no rebound or guarding. No hepatosplenomegaly.   Rectal:  Deferred  Msk:  Symmetrical. No boney deformities LAD: No inguinal or umbilical LAD Extremities:  No clubbing or edema. Neurologic:  Alert and  oriented x4;  grossly nonfocal Skin:  Intact without significant lesions or rashes. Psych:  Alert and cooperative. Normal mood and affect.    Douglas Harper L. Tarri Glenn, MD, MPH 05/12/2019, 3:21 PM

## 2019-05-12 NOTE — Telephone Encounter (Signed)
3 day ZIO ordered and mailed to pt.

## 2019-05-12 NOTE — Patient Instructions (Addendum)
Thank you for coming to see me today.  I have recommended a colonoscopy for colon cancer screening.   I have also recommended an upper endoscopy to evaluate the sensation in your throat.  In the meantime, please try pantoprazole 40 mg daily. I recommend that you take this 30-60 minutes before breakfast.  Please call with any questions or concerns before your procedure.    If you are age 49 or older, your body mass index should be between 23-30. Your Body mass index is 29.1 kg/m. If this is out of the aforementioned range listed, please consider follow up with your Primary Care Provider.  If you are age 30 or younger, your body mass index should be between 19-25. Your Body mass index is 29.1 kg/m. If this is out of the aformentioned range listed, please consider follow up with your Primary Care Provider.   You have been scheduled for an endoscopy and colonoscopy. Please follow the written instructions given to you at your visit today. Please pick up your prep supplies at the pharmacy within the next 1-3 days. If you use inhalers (even only as needed), please bring them with you on the day of your procedure. Your physician has requested that you go to www.startemmi.com and enter the access code given to you at your visit today. This web site gives a general overview about your procedure. However, you should still follow specific instructions given to you by our office regarding your preparation for the procedure.  It was a pleasure to see you today!  Dr. Thornton Park

## 2019-05-19 ENCOUNTER — Other Ambulatory Visit (HOSPITAL_COMMUNITY)
Admission: RE | Admit: 2019-05-19 | Discharge: 2019-05-19 | Disposition: A | Discharge status: Home / Self Care | Payer: 59 | Source: Ambulatory Visit | Attending: Cardiology | Admitting: Cardiology

## 2019-05-19 DIAGNOSIS — Z01812 Encounter for preprocedural laboratory examination: Secondary | ICD-10-CM | POA: Diagnosis not present

## 2019-05-19 DIAGNOSIS — Z20828 Contact with and (suspected) exposure to other viral communicable diseases: Secondary | ICD-10-CM | POA: Insufficient documentation

## 2019-05-20 ENCOUNTER — Telehealth (HOSPITAL_COMMUNITY): Payer: Self-pay

## 2019-05-20 LAB — NOVEL CORONAVIRUS, NAA (HOSP ORDER, SEND-OUT TO REF LAB; TAT 18-24 HRS): SARS-CoV-2, NAA: NOT DETECTED

## 2019-05-20 NOTE — Telephone Encounter (Signed)
Encounter complete. 

## 2019-05-21 ENCOUNTER — Telehealth: Payer: Self-pay | Admitting: Cardiology

## 2019-05-21 NOTE — Telephone Encounter (Signed)
New message:     Patient just received his device and he has some questions. Please call patient back.

## 2019-05-21 NOTE — Telephone Encounter (Signed)
Patient scheduled for a GXT tomorrow and wanted to know if he should apply his ZIO patch before or after his stress test.  I recommended to patient to have stress test, shower and then apply his ZIO patch monitor to assure best adhesion.

## 2019-05-22 ENCOUNTER — Other Ambulatory Visit: Payer: Self-pay

## 2019-05-22 ENCOUNTER — Ambulatory Visit (INDEPENDENT_AMBULATORY_CARE_PROVIDER_SITE_OTHER): Payer: 59

## 2019-05-22 ENCOUNTER — Encounter: Payer: Self-pay | Admitting: Internal Medicine

## 2019-05-22 ENCOUNTER — Ambulatory Visit (HOSPITAL_COMMUNITY)
Admission: RE | Admit: 2019-05-22 | Discharge: 2019-05-22 | Disposition: A | Discharge status: Home / Self Care | Payer: 59 | Source: Ambulatory Visit | Attending: Cardiology | Admitting: Cardiology

## 2019-05-22 DIAGNOSIS — I421 Obstructive hypertrophic cardiomyopathy: Secondary | ICD-10-CM | POA: Insufficient documentation

## 2019-05-22 LAB — EXERCISE TOLERANCE TEST
Estimated workload: 10.1 METS
Exercise duration (min): 8 min
Exercise duration (sec): 45 s
MPHR: 171 {beats}/min
Peak HR: 166 {beats}/min
Percent HR: 97 %
RPE: 17
Rest HR: 73 {beats}/min

## 2019-05-27 ENCOUNTER — Ambulatory Visit (INDEPENDENT_AMBULATORY_CARE_PROVIDER_SITE_OTHER): Payer: 59

## 2019-05-27 ENCOUNTER — Other Ambulatory Visit: Payer: Self-pay | Admitting: Gastroenterology

## 2019-05-27 ENCOUNTER — Encounter: Payer: Self-pay | Admitting: Gastroenterology

## 2019-05-27 DIAGNOSIS — Z1159 Encounter for screening for other viral diseases: Secondary | ICD-10-CM | POA: Diagnosis not present

## 2019-05-28 LAB — SARS CORONAVIRUS 2 (TAT 6-24 HRS): SARS Coronavirus 2: NEGATIVE

## 2019-05-29 ENCOUNTER — Other Ambulatory Visit: Payer: Self-pay | Admitting: Gastroenterology

## 2019-05-29 ENCOUNTER — Ambulatory Visit (AMBULATORY_SURGERY_CENTER): Payer: 59 | Admitting: Gastroenterology

## 2019-05-29 ENCOUNTER — Encounter: Payer: Self-pay | Admitting: Gastroenterology

## 2019-05-29 ENCOUNTER — Other Ambulatory Visit: Payer: Self-pay

## 2019-05-29 VITALS — BP 132/84 | HR 65 | Temp 98.1°F | Resp 13 | Ht 75.0 in | Wt 232.0 lb

## 2019-05-29 DIAGNOSIS — R0989 Other specified symptoms and signs involving the circulatory and respiratory systems: Secondary | ICD-10-CM

## 2019-05-29 DIAGNOSIS — Z1211 Encounter for screening for malignant neoplasm of colon: Secondary | ICD-10-CM | POA: Diagnosis not present

## 2019-05-29 DIAGNOSIS — K2980 Duodenitis without bleeding: Secondary | ICD-10-CM | POA: Diagnosis not present

## 2019-05-29 DIAGNOSIS — K635 Polyp of colon: Secondary | ICD-10-CM

## 2019-05-29 DIAGNOSIS — K208 Other esophagitis without bleeding: Secondary | ICD-10-CM | POA: Diagnosis not present

## 2019-05-29 DIAGNOSIS — K3189 Other diseases of stomach and duodenum: Secondary | ICD-10-CM | POA: Diagnosis not present

## 2019-05-29 DIAGNOSIS — K2990 Gastroduodenitis, unspecified, without bleeding: Secondary | ICD-10-CM

## 2019-05-29 DIAGNOSIS — K2950 Unspecified chronic gastritis without bleeding: Secondary | ICD-10-CM

## 2019-05-29 DIAGNOSIS — K228 Other specified diseases of esophagus: Secondary | ICD-10-CM | POA: Diagnosis not present

## 2019-05-29 DIAGNOSIS — K219 Gastro-esophageal reflux disease without esophagitis: Secondary | ICD-10-CM | POA: Diagnosis not present

## 2019-05-29 DIAGNOSIS — F458 Other somatoform disorders: Secondary | ICD-10-CM | POA: Diagnosis not present

## 2019-05-29 DIAGNOSIS — K2970 Gastritis, unspecified, without bleeding: Secondary | ICD-10-CM

## 2019-05-29 DIAGNOSIS — D125 Benign neoplasm of sigmoid colon: Secondary | ICD-10-CM

## 2019-05-29 MED ORDER — SODIUM CHLORIDE 0.9 % IV SOLN: 500.0000 mL | Freq: Once | INTRAVENOUS | Status: DC

## 2019-05-29 NOTE — Op Note (Signed)
[facility] Patient Name: Douglas Harper Procedure Date: 05/29/2019 2:01 PM MRN: [id number] Endoscopist: Thornton Park MD, MD Age: 48 Referring MD:  Date of Birth: 05/12/1971 Gender: Male Account #: [id number] Procedure:                Colonoscopy Indications:              Screening for colorectal malignant neoplasm, This                            is the patient's first colonoscopy                           No known family history of colon cancer or polyps Medicines:                Monitored Anesthesia Care Procedure:                Pre-Anesthesia Assessment:                           - Prior to the procedure, a History and Physical                            was performed, and patient medications and                            allergies were reviewed. The patient's tolerance of                            previous anesthesia was also reviewed. The risks                            and benefits of the procedure and the sedation                            options and risks were discussed with the patient.                            All questions were answered, and informed consent                            was obtained. Prior Anticoagulants: The patient has                            taken no previous anticoagulant or antiplatelet                            agents. ASA Grade Assessment: II - A patient with                            mild systemic disease. After reviewing the risks                            and benefits, the patient was deemed in  satisfactory condition to undergo the procedure.                           After obtaining informed consent, the colonoscope                            was passed under direct vision. Throughout the                            procedure, the patient's blood pressure, pulse, and                            oxygen saturations were monitored continuously. The                            Colonoscope was introduced  through the anus and                            advanced to the the terminal ileum, with                            identification of the appendiceal orifice and IC                            valve. A second forward view of the right colon was                            performed. The colonoscopy was performed without                            difficulty. The patient tolerated the procedure                            well. The quality of the bowel preparation was                            good. The terminal ileum, ileocecal valve,                            appendiceal orifice, and rectum were photographed. Scope In: 2:16:36 PM Scope Out: 2:30:27 PM Scope Withdrawal Time: 0 hours 12 minutes 6 seconds  Total Procedure Duration: 0 hours 13 minutes 51 seconds  Findings:                 The perianal and digital rectal examinations were                            normal.                           Non-bleeding internal hemorrhoids were found. The                            hemorrhoids were small.  A 2 mm polyp was found in the distal sigmoid colon.                            The polyp was flat. The polyp was removed with a                            cold snare. Resection and retrieval were complete.                            Estimated blood loss was minimal.                           The terminal ileum appeared normal.                           The exam was otherwise without abnormality on                            direct and retroflexion views. Complications:            No immediate complications. Estimated blood loss:                            Minimal. Estimated Blood Loss:     Estimated blood loss was minimal. Impression:               - Non-bleeding internal hemorrhoids.                           - One 2 mm polyp in the distal sigmoid colon,                            removed with a cold snare. Resected and retrieved.                           - The  examination was otherwise normal on direct                            and retroflexion views. Recommendation:           - Patient has a contact number available for                            emergencies. The signs and symptoms of potential                            delayed complications were discussed with the                            patient. Return to normal activities tomorrow.                            Written discharge instructions were provided to the  patient.                           - Resume previous diet.                           - Continue present medications.                           - Await pathology results.                           - Repeat colonoscopy date to be determined after                            pending pathology results are reviewed for                            surveillance. Thornton Park MD, MD 05/29/2019 2:40:34 PM This report has been signed electronically.

## 2019-05-29 NOTE — Progress Notes (Signed)
To pacu, VSS. Report to [email or web address] 

## 2019-05-29 NOTE — Patient Instructions (Signed)
Handouts given ;  Polyps,Gastritis Resume previous  Diet Continue present medications Await patholgy    YOU HAD AN ENDOSCOPIC PROCEDURE TODAY AT [facility]:   Refer to the procedure report that was given to you for any specific questions about what was found during the examination.  If the procedure report does not answer your questions, please call your gastroenterologist to clarify.  If you requested that your care partner not be given the details of your procedure findings, then the procedure report has been included in a sealed envelope for you to review at your convenience later.  YOU SHOULD EXPECT: Some feelings of bloating in the abdomen. Passage of more gas than usual.  Walking can help get rid of the air that was put into your GI tract during the procedure and reduce the bloating. If you had a lower endoscopy (such as a colonoscopy or flexible sigmoidoscopy) you may notice spotting of blood in your stool or on the toilet paper. If you underwent a bowel prep for your procedure, you may not have a normal bowel movement for a few days.  Please Note:  You might notice some irritation and congestion in your nose or some drainage.  This is from the oxygen used during your procedure.  There is no need for concern and it should clear up in a day or so.  SYMPTOMS TO REPORT IMMEDIATELY:   Following lower endoscopy (colonoscopy or flexible sigmoidoscopy):  Excessive amounts of blood in the stool  Significant tenderness or worsening of abdominal pains  Swelling of the abdomen that is new, acute  Fever of 100F or higher   Following upper endoscopy (EGD)  Vomiting of blood or coffee ground material  New chest pain or pain under the shoulder blades  Painful or persistently difficult swallowing  New shortness of breath  Fever of 100F or higher  Black, tarry-looking stools  For urgent or emergent issues, a gastroenterologist can be reached at any hour by calling [phone]  [phone].   DIET:  We do recommend a small meal at first, but then you may proceed to your regular diet.  Drink plenty of fluids but you should avoid alcoholic beverages for 24 hours.  ACTIVITY:  You should plan to take it easy for the rest of today and you should NOT DRIVE or use heavy machinery until tomorrow (because of the sedation medicines used during the test).    FOLLOW UP: Our staff will call the number listed on your records 48-72 hours following your procedure to check on you and address any questions or concerns that you may have regarding the information given to you following your procedure. If we do not reach you, we will leave a message.  We will attempt to reach you two times.  During this call, we will ask if you have developed any symptoms of COVID 19. If you develop any symptoms (ie: fever, flu-like symptoms, shortness of breath, cough etc.) before then, please call [phone].  If you test positive for Covid 19 in the 2 weeks post procedure, please call and report this information to [place].    If any biopsies were taken you will be contacted by phone or by letter within the next 1-3 weeks.  Please call us at [phone] if you have not heard about the biopsies in 3 weeks.    SIGNATURES/CONFIDENTIALITY: You and/or your care partner have signed paperwork which will be entered into your electronic medical record.  These signatures attest to  the fact that that the information above on your After Visit Summary has been reviewed and is understood.  Full responsibility of the confidentiality of this discharge information lies with you and/or your care-partner.

## 2019-05-29 NOTE — Progress Notes (Signed)
Called to room to assist during endoscopic procedure.  Patient ID and intended procedure confirmed with present staff. Received instructions for my participation in the procedure from the performing physician.  

## 2019-05-29 NOTE — Op Note (Signed)
[facility] Patient Name: Douglas Harper Procedure Date: 05/29/2019 2:02 PM MRN: [id number] Endoscopist: Thornton Park MD, MD Age: 48 Referring MD:  Date of Birth: 09/17/1970 Gender: Male Account #: [id number] Procedure:                Upper GI endoscopy Indications:              Suspected esophageal reflux, Globus sensation Medicines:                Monitored Anesthesia Care Procedure:                Pre-Anesthesia Assessment:                           - Prior to the procedure, a History and Physical                            was performed, and patient medications and                            allergies were reviewed. The patient's tolerance of                            previous anesthesia was also reviewed. The risks                            and benefits of the procedure and the sedation                            options and risks were discussed with the patient.                            All questions were answered, and informed consent                            was obtained. Prior Anticoagulants: The patient has                            taken no previous anticoagulant or antiplatelet                            agents. ASA Grade Assessment: II - A patient with                            mild systemic disease. After reviewing the risks                            and benefits, the patient was deemed in                            satisfactory condition to undergo the procedure.                           After obtaining informed consent, the endoscope was  passed under direct vision. Throughout the                            procedure, the patient's blood pressure, pulse, and                            oxygen saturations were monitored continuously. The                            Endoscope was introduced through the mouth, and                            advanced to the third part of duodenum. The upper                            GI endoscopy  was accomplished without difficulty.                            The patient tolerated the procedure well. Scope In: Scope Out: Findings:                 The examined esophagus was normal. Biopsies were                            obtained from the proximal and distal esophagus                            with cold forceps for histology of suspected                            eosinophilic esophagitis.                           The entire examined stomach was normal. Biopsies                            were taken from the antrum, body, and fundus with a                            cold forceps for histology. Estimated blood loss                            was minimal.                           Patchy mildly erythematous mucosa without active                            bleeding and with no stigmata of bleeding was found                            in the duodenal bulb. Biopsies were taken with a                            cold  forceps for histology. Estimated blood loss                            was minimal.                           The cardia and gastric fundus were normal on                            retroflexion. Complications:            No immediate complications. Estimated blood loss:                            Minimal. Estimated Blood Loss:     Estimated blood loss was minimal. Impression:               - Normal esophagus. Biopsied.                           - Normal stomach. Biopsied.                           - Erythematous duodenopathy. Biopsied. Recommendation:           - Patient has a contact number available for                            emergencies. The signs and symptoms of potential                            delayed complications were discussed with the                            patient. Return to normal activities tomorrow.                            Written discharge instructions were provided to the                            patient.                           -  Resume previous diet.                           - Continue present medications. Continue                            pantoprazole 40 mg daily x 8 weeks.                           - Await pathology results.                           - Proceed with colonoscopy today as previously  planned. Thornton Park MD, MD 05/29/2019 2:37:43 PM This report has been signed electronically.

## 2019-06-02 ENCOUNTER — Telehealth: Payer: Self-pay

## 2019-06-02 NOTE — Telephone Encounter (Signed)
Covid-19 screening questions   Do you now or have you had a fever in the last 14 days? No.  Do you have any respiratory symptoms of shortness of breath or cough now or in the last 14 days? No.  Do you have any family members or close contacts with diagnosed or suspected Covid-19 in the past 14 days? No.  Have you been tested for Covid-19 and found to be positive? No.       Follow up Call-  Call back number 05/29/2019  Post procedure Call Back phone  # [phone]  Permission to leave phone message Yes  Some recent data might be hidden     Patient questions:  Do you have a fever, pain , or abdominal swelling? No. Pain Score  0 *  Have you tolerated food without any problems? Yes.  Have you been able to return to your normal activities? Yes.  Do you have any questions about your discharge instructions: Diet   No. Medications  No. Follow up visit  No.  Do you have questions or concerns about your Care? No.  Actions: * If pain score is 4 or above: No action needed, pain <4.

## 2019-06-03 DIAGNOSIS — I421 Obstructive hypertrophic cardiomyopathy: Secondary | ICD-10-CM | POA: Diagnosis not present

## 2019-06-11 ENCOUNTER — Encounter: Payer: Self-pay | Admitting: Gastroenterology

## 2019-06-24 MED FILL — LEVOTHYROXINE 125 MCG TABLE: 125 | 90 days supply | Qty: 90 | Fill #0

## 2019-07-25 ENCOUNTER — Telehealth: Payer: Self-pay | Admitting: *Deleted

## 2019-07-25 NOTE — Telephone Encounter (Signed)
OK 

## 2019-07-25 NOTE — Telephone Encounter (Signed)
   Called patient to follow up on genetic counseling/kit. Per patient not covered by insurance and it was about $500 and he opted not to pursue. Will forward to Dr Percival Spanish for review        Advised patient, patient has mailed kit. He will call once he hears from them.   Shannan, Garfinkel, MD12/10/2018 8:59 PM EST    The EKG changes were not diagnostic of ischemia. I was looking for a hypotensive response to exercise as a marker of increased risk with HCM. He had a hypertensive response. I am not considering further ischemia work up . I would like to see him after genetic counseling is completed. Call Mr. Mastro with the results and send results to McLean-Scocuzza, Nino Glow, MD

## 2019-09-22 MED FILL — LEVOTHYROXINE 125 MCG TABLE: 125 | 90 days supply | Qty: 90 | Fill #1

## 2019-09-24 ENCOUNTER — Encounter: Payer: Self-pay | Admitting: Internal Medicine

## 2019-09-24 ENCOUNTER — Other Ambulatory Visit: Payer: Self-pay

## 2019-09-24 ENCOUNTER — Other Ambulatory Visit (INDEPENDENT_AMBULATORY_CARE_PROVIDER_SITE_OTHER): Payer: 59

## 2019-09-24 DIAGNOSIS — Z1329 Encounter for screening for other suspected endocrine disorder: Secondary | ICD-10-CM | POA: Diagnosis not present

## 2019-09-24 DIAGNOSIS — E039 Hypothyroidism, unspecified: Secondary | ICD-10-CM

## 2019-09-24 DIAGNOSIS — Z13220 Encounter for screening for lipoid disorders: Secondary | ICD-10-CM

## 2019-09-24 DIAGNOSIS — Z0000 Encounter for general adult medical examination without abnormal findings: Secondary | ICD-10-CM | POA: Diagnosis not present

## 2019-09-24 DIAGNOSIS — E559 Vitamin D deficiency, unspecified: Secondary | ICD-10-CM | POA: Diagnosis not present

## 2019-09-24 DIAGNOSIS — R7303 Prediabetes: Secondary | ICD-10-CM | POA: Diagnosis not present

## 2019-09-24 LAB — CBC WITH DIFFERENTIAL/PLATELET
Basophils Absolute: 0 10*3/uL (ref 0.0–0.1)
Basophils Relative: 1.1 % (ref 0.0–3.0)
Eosinophils Absolute: 0.1 10*3/uL (ref 0.0–0.7)
Eosinophils Relative: 3.2 % (ref 0.0–5.0)
HCT: 40.7 % (ref 39.0–52.0)
Hemoglobin: 12.8 g/dL — ABNORMAL LOW (ref 13.0–17.0)
Lymphocytes Relative: 27.5 % (ref 12.0–46.0)
Lymphs Abs: 1.2 10*3/uL (ref 0.7–4.0)
MCHC: 31.5 g/dL (ref 30.0–36.0)
MCV: 63.6 fl — ABNORMAL LOW (ref 78.0–100.0)
Monocytes Absolute: 0.5 10*3/uL (ref 0.1–1.0)
Monocytes Relative: 12.4 % — ABNORMAL HIGH (ref 3.0–12.0)
Neutro Abs: 2.5 10*3/uL (ref 1.4–7.7)
Neutrophils Relative %: 55.8 % (ref 43.0–77.0)
Platelets: 146 10*3/uL — ABNORMAL LOW (ref 150.0–400.0)
RBC: 6.39 Mil/uL — ABNORMAL HIGH (ref 4.22–5.81)
RDW: 17.5 % — ABNORMAL HIGH (ref 11.5–15.5)
WBC: 4.4 10*3/uL (ref 4.0–10.5)

## 2019-09-24 LAB — COMPREHENSIVE METABOLIC PANEL
ALT: 37 U/L (ref 0–53)
AST: 24 U/L (ref 0–37)
Albumin: 4.3 g/dL (ref 3.5–5.2)
Alkaline Phosphatase: 52 U/L (ref 39–117)
BUN: 11 mg/dL (ref 6–23)
CO2: 29 mEq/L (ref 19–32)
Calcium: 9.3 mg/dL (ref 8.4–10.5)
Chloride: 104 mEq/L (ref 96–112)
Creatinine, Ser: 1.24 mg/dL (ref 0.40–1.50)
GFR: 74.79 mL/min (ref >60.00)
Glucose, Bld: 113 mg/dL — ABNORMAL HIGH (ref 70–99)
Potassium: 3.8 mEq/L (ref 3.5–5.1)
Sodium: 140 mEq/L (ref 135–145)
Total Bilirubin: 1.4 mg/dL — ABNORMAL HIGH (ref 0.2–1.2)
Total Protein: 7.1 g/dL (ref 6.0–8.3)

## 2019-09-24 LAB — LIPID PANEL
Cholesterol: 172 mg/dL (ref 0–200)
HDL: 51.2 mg/dL (ref >39.00)
LDL Cholesterol: 107 mg/dL — ABNORMAL HIGH (ref 0–99)
NonHDL: 120.79
Total CHOL/HDL Ratio: 3
Triglycerides: 68 mg/dL (ref 0.0–149.0)
VLDL: 13.6 mg/dL (ref 0.0–40.0)

## 2019-09-24 LAB — VITAMIN D 25 HYDROXY (VIT D DEFICIENCY, FRACTURES): VITD: 46.71 ng/mL (ref 30.00–100.00)

## 2019-09-24 LAB — HEMOGLOBIN A1C: Hgb A1c MFr Bld: 5.6 % (ref 4.6–6.5)

## 2019-09-24 LAB — TSH: TSH: 2.76 u[IU]/mL (ref 0.35–4.50)

## 2019-09-26 ENCOUNTER — Ambulatory Visit: Payer: 59 | Admitting: Internal Medicine

## 2019-10-07 ENCOUNTER — Encounter: Payer: Self-pay | Admitting: Internal Medicine

## 2019-10-07 DIAGNOSIS — E890 Postprocedural hypothyroidism: Secondary | ICD-10-CM

## 2019-10-08 ENCOUNTER — Telehealth: Payer: Self-pay | Admitting: Internal Medicine

## 2019-10-08 NOTE — Telephone Encounter (Signed)
Pt scheduled for 4/23 at 11:30 am in office.

## 2019-10-08 NOTE — Telephone Encounter (Signed)
October 08, 2019 Me to McLean-Scocuzza, Nino Glow, MD      8:48 AM Please advise   October 07, 2019 Lelon Perla to McLean-Scocuzza, Nino Glow, MD      7:38 PM Hello i have a small fatty tissue near my left clavicle want to come in and have a look at making sure its not a mass that need to be biopsied please i can come in on Wednesday or Friday if u can fit  me in please thank you

## 2019-10-08 NOTE — Telephone Encounter (Signed)
Left message to return call. Can place the patient in one of the same day slots open on Friday 4/23

## 2019-10-08 NOTE — Telephone Encounter (Signed)
Call and schedule in person appt   [place]

## 2019-10-10 ENCOUNTER — Ambulatory Visit: Payer: 59 | Admitting: Internal Medicine

## 2019-10-10 ENCOUNTER — Encounter: Payer: Self-pay | Admitting: Internal Medicine

## 2019-10-10 ENCOUNTER — Other Ambulatory Visit: Payer: Self-pay

## 2019-10-10 VITALS — BP 112/80 | HR 75 | Temp 98.0°F | Ht 75.0 in | Wt 220.8 lb

## 2019-10-10 DIAGNOSIS — E039 Hypothyroidism, unspecified: Secondary | ICD-10-CM | POA: Diagnosis not present

## 2019-10-10 DIAGNOSIS — R718 Other abnormality of red blood cells: Secondary | ICD-10-CM

## 2019-10-10 DIAGNOSIS — R590 Localized enlarged lymph nodes: Secondary | ICD-10-CM | POA: Insufficient documentation

## 2019-10-10 NOTE — Progress Notes (Signed)
Chief Complaint  Patient presents with  . Follow-up  . Mass    In the left clavicale area. Got 2nd dose Moderna injection Saturday 4/17, Noticed the mass 4/18. States it was squishy and slightly sore. States he was concerned because heard of the shot sometimes causing blood clots.    F/u 1. Left above clavicle pain and swelling since the vaccine 10/04/19 2nd covid vaccine when pressing is painful otherwise not painful  2. Microcytosis no h/o SCT/alpha or beta thal per pt  3. Prediabetes improved no prediabetes A1C 5.6 lost 22 lbs eating better and working out more  4. Hypothyroidism on levo 125 mcg tsh normal 09/2019    Review of Systems  Constitutional: Positive for weight loss.  HENT: Negative for hearing loss.   Eyes: Negative for blurred vision.  Respiratory: Negative for shortness of breath.   Cardiovascular: Negative for chest pain.  Gastrointestinal: Negative for abdominal pain.  Musculoskeletal: Negative for falls.  Skin: Negative for rash.  Endo/Heme/Allergies:       +lymphadenopathy   Psychiatric/Behavioral: Negative for depression.   Past Medical History:  Diagnosis Date  . FH: heart disease   . GERD (gastroesophageal reflux disease)   . Glaucoma   . Hyperthyroidism    s/p RAI 2009   . Hypothyroidism   . Verruca vulgaris    Past Surgical History:  Procedure Laterality Date  . ANTERIOR CRUCIATE LIGAMENT REPAIR Right 2000  . HAND SURGERY Right 2001   with hardware implanted  . NO PAST SURGERIES     Family History  Problem Relation Age of Onset  . Heart disease Mother   . Hypertension Mother   . Diabetes Mother   . Colon cancer Neg Hx   . Esophageal cancer Neg Hx   . Pancreatic cancer Neg Hx   . Liver disease Neg Hx   . Rectal cancer Neg Hx   . Stomach cancer Neg Hx    Social History   Socioeconomic History  . Marital status: Married    Spouse name: Not on file  . Number of children: Not on file  . Years of education: Not on file  . Highest  education level: Not on file  Occupational History  . Not on file  Tobacco Use  . Smoking status: Never Smoker  . Smokeless tobacco: Never Used  Substance and Sexual Activity  . Alcohol use: Yes    Comment: socially  . Drug use: Never  . Sexual activity: Not on file  Other Topics Concern  . Not on file  Social History Narrative   Married wife Deanglo Hissong DPR    From [place] been here since 49 y.o    Works Designer, multimedia [employer]    No tobacco, drugs, occas. etoh    8 siblings   Social Determinants of Health   Financial Resource Strain:   . Difficulty of Paying Living Expenses:   Food Insecurity:   . Worried About Charity fundraiser in the Last Year:   . Arboriculturist in the Last Year:   Transportation Needs:   . Film/video editor (Medical):   Marland Kitchen Lack of Transportation (Non-Medical):   Physical Activity:   . Days of Exercise per Week:   . Minutes of Exercise per Session:   Stress:   . Feeling of Stress :   Social Connections:   . Frequency of Communication with Friends and Family:   . Frequency of Social Gatherings with Friends and Family:   .  Attends Religious Services:   . Active Member of Clubs or Organizations:   . Attends Archivist Meetings:   Marland Kitchen Marital Status:   Intimate Partner Violence:   . Fear of Current or Ex-Partner:   . Emotionally Abused:   Marland Kitchen Physically Abused:   . Sexually Abused:    Current Meds  Medication Sig  . Cetirizine HCl (ZYRTEC PO) Take by mouth.  . levothyroxine (SYNTHROID) 125 MCG tablet Take 1 tablet (125 mcg total) by mouth daily before breakfast.   No Known Allergies Recent Results (from the past 2160 hour(s))  Vitamin D (25 hydroxy)     Status: None   Collection Time: 09/24/19  9:22 AM  Result Value Ref Range   VITD 46.71 30.00 - 100.00 ng/mL  TSH     Status: None   Collection Time: 09/24/19  9:22 AM  Result Value Ref Range   TSH 2.76 0.35 - 4.50 uIU/mL  Hemoglobin A1c     Status: None   Collection  Time: 09/24/19  9:22 AM  Result Value Ref Range   Hgb A1c MFr Bld 5.6 4.6 - 6.5 %    Comment: Glycemic Control Guidelines for People with Diabetes:Non Diabetic:  <6%Goal of Therapy: <7%Additional Action Suggested:  >8%   Lipid panel     Status: Abnormal   Collection Time: 09/24/19  9:22 AM  Result Value Ref Range   Cholesterol 172 0 - 200 mg/dL    Comment: ATP III Classification       Desirable:  < 200 mg/dL               Borderline High:  200 - 239 mg/dL          High:  > = 240 mg/dL   Triglycerides 68.0 0.0 - 149.0 mg/dL    Comment: Normal:  <150 mg/dLBorderline High:  150 - 199 mg/dL   HDL 51.20 >39.00 mg/dL   VLDL 13.6 0.0 - 40.0 mg/dL   LDL Cholesterol 107 (H) 0 - 99 mg/dL   Total CHOL/HDL Ratio 3     Comment:                Men          Women1/2 Average Risk     3.4          3.3Average Risk          5.0          4.42X Average Risk          9.6          7.13X Average Risk          15.0          11.0                       NonHDL 120.79     Comment: NOTE:  Non-HDL goal should be 30 mg/dL higher than patient's LDL goal (i.e. LDL goal of < 70 mg/dL, would have non-HDL goal of < 100 mg/dL)  CBC with Differential/Platelet     Status: Abnormal   Collection Time: 09/24/19  9:22 AM  Result Value Ref Range   WBC 4.4 4.0 - 10.5 K/uL   RBC 6.39 (H) 4.22 - 5.81 Mil/uL   Hemoglobin 12.8 (L) 13.0 - 17.0 g/dL   HCT 40.7 39.0 - 52.0 %   MCV 63.6 Repeated and verified X2. (L) 78.0 - 100.0 fl   MCHC 31.5 30.0 - 36.0  g/dL   RDW 17.5 (H) 11.5 - 15.5 %   Platelets 146.0 (L) 150.0 - 400.0 K/uL   Neutrophils Relative % 55.8 43.0 - 77.0 %   Lymphocytes Relative 27.5 12.0 - 46.0 %   Monocytes Relative 12.4 (H) 3.0 - 12.0 %   Eosinophils Relative 3.2 0.0 - 5.0 %   Basophils Relative 1.1 0.0 - 3.0 %   Neutro Abs 2.5 1.4 - 7.7 K/uL   Lymphs Abs 1.2 0.7 - 4.0 K/uL   Monocytes Absolute 0.5 0.1 - 1.0 K/uL   Eosinophils Absolute 0.1 0.0 - 0.7 K/uL   Basophils Absolute 0.0 0.0 - 0.1 K/uL  Comprehensive  metabolic panel     Status: Abnormal   Collection Time: 09/24/19  9:22 AM  Result Value Ref Range   Sodium 140 135 - 145 mEq/L   Potassium 3.8 3.5 - 5.1 mEq/L   Chloride 104 96 - 112 mEq/L   CO2 29 19 - 32 mEq/L   Glucose, Bld 113 (H) 70 - 99 mg/dL   BUN 11 6 - 23 mg/dL   Creatinine, Ser 1.24 0.40 - 1.50 mg/dL   Total Bilirubin 1.4 (H) 0.2 - 1.2 mg/dL   Alkaline Phosphatase 52 39 - 117 U/L   AST 24 0 - 37 U/L   ALT 37 0 - 53 U/L   Total Protein 7.1 6.0 - 8.3 g/dL   Albumin 4.3 3.5 - 5.2 g/dL   GFR 74.79 >60.00 mL/min   Calcium 9.3 8.4 - 10.5 mg/dL   Objective  Body mass index is 27.6 kg/m. Wt Readings from Last 3 Encounters:  10/10/19 220 lb 12.8 oz (100.2 kg)  05/29/19 232 lb (105.2 kg)  05/12/19 232 lb 12.8 oz (105.6 kg)   Temp Readings from Last 3 Encounters:  10/10/19 98 F (36.7 C) (Oral)  05/29/19 98.1 F (36.7 C) (Oral)  05/12/19 (!) 97.4 F (36.3 C)   BP Readings from Last 3 Encounters:  10/10/19 112/80  05/29/19 132/84  05/12/19 110/70   Pulse Readings from Last 3 Encounters:  10/10/19 75  05/29/19 65  05/12/19 76    Physical Exam Vitals and nursing note reviewed.  Constitutional:      Appearance: Normal appearance. He is well-developed and well-groomed.  HENT:     Head: Normocephalic and atraumatic.  Eyes:     Conjunctiva/sclera: Conjunctivae normal.     Pupils: Pupils are equal, round, and reactive to light.  Cardiovascular:     Rate and Rhythm: Normal rate and regular rhythm.     Heart sounds: Normal heart sounds. No murmur.  Pulmonary:     Effort: Pulmonary effort is normal.     Breath sounds: Normal breath sounds.  Lymphadenopathy:     Upper Body:     Left upper body: Supraclavicular adenopathy present.  Skin:    General: Skin is warm and dry.  Neurological:     General: No focal deficit present.     Mental Status: He is alert and oriented to person, place, and time. Mental status is at baseline.     Gait: Gait normal.  Psychiatric:         Attention and Perception: Attention and perception normal.        Mood and Affect: Mood and affect normal.        Speech: Speech normal.        Behavior: Behavior normal. Behavior is cooperative.        Thought Content: Thought content normal.  Cognition and Memory: Cognition and memory normal.        Judgment: Judgment normal.     Assessment  Plan  Lymphadenopathy, supraclavicular Likely 2/2 covid # 2 vaccine Monitor will likely resolve in 2 weeks  Microcytosis Consider w/u SCT/alpha thal/beta thal Will ask h/o consult  Hypothyroidism, unspecified type  Cont meds   HM utd flu shot and Tdap had 02/27/19  rec twinrix with fatty liver and MMR covid 19 2/2   11/28/18 PSA 0.64 declines DRE for now 03/27/19 Colonoscopy referred [facility]  05/29/2019 hyperplastic polyp  rec healthy diet and exercise Provider: Dr. Olivia Mackie McLean-Scocuzza-Internal Medicine

## 2019-10-10 NOTE — Patient Instructions (Addendum)
Baked foods better or grilled  [employer] work no prediabetes any longer  Monitor above left clavicle x 2 weeks it should go away I think its a gland from the vaccine if needed Tylenol or heating pad    Lymphadenopathy  Lymphadenopathy means that your lymph glands are swollen or larger than normal (enlarged). Lymph glands, also called lymph nodes, are collections of tissue that filter bacteria, viruses, and waste from your bloodstream. They are part of your body's disease-fighting system (immune system), which protects your body from germs. There may be different causes of lymphadenopathy, depending on where it is in your body. Some types go away on their own. Lymphadenopathy can occur anywhere that you have lymph glands, including these areas:  Neck (cervical lymphadenopathy).  Chest (mediastinal lymphadenopathy).  Lungs (hilar lymphadenopathy).  Underarms (axillary lymphadenopathy).  Groin (inguinal lymphadenopathy). When your immune system responds to germs, infection-fighting cells and fluid build up in your lymph glands. This causes some swelling and enlargement. If the lymph glands do not go back to normal after you have an infection or disease, your health care provider may do tests. These tests help to monitor your condition and find the reason why the glands are still swollen and enlarged. Follow these instructions at home:  Get plenty of rest.  Take over-the-counter and prescription medicines only as told by your health care provider. Your health care provider may recommend over-the-counter medicines for pain.  If directed, apply heat to swollen lymph glands as often as told by your health care provider. Use the heat source that your health care provider recommends, such as a moist heat pack or a heating pad. ? Place a towel between your skin and the heat source. ? Leave the heat on for 20-30 minutes. ? Remove the heat if your skin turns  bright red. This is especially important if you are unable to feel pain, heat, or cold. You may have a greater risk of getting burned.  Check your affected lymph glands every day for changes. Check other lymph gland areas as told by your health care provider. Check for changes such as: ? More swelling. ? Sudden increase in size. ? Redness or pain. ? Hardness.  Keep all follow-up visits as told by your health care provider. This is important. Contact a health care provider if you have:  Swelling that gets worse or spreads to other areas.  Problems with breathing.  Lymph glands that: ? Are still swollen after 2 weeks. ? Have suddenly gotten bigger. ? Are red, painful, or hard.  A fever or chills.  Fatigue.  A sore throat.  Pain in your abdomen.  Weight loss.  Night sweats. Get help right away if you have:  Fluid leaking from an enlarged lymph gland.  Severe pain.  Chest pain.  Shortness of breath. Summary  Lymphadenopathy means that your lymph glands are swollen or larger than normal (enlarged).  Lymph glands (also called lymph nodes) are collections of tissue that filter bacteria, viruses, and waste from the bloodstream. They are part of your body's disease-fighting system (immune system).  Lymphadenopathy can occur anywhere that you have lymph glands.  If your enlarged and swollen lymph glands do not go back to normal after you have an infection or disease, your health care provider may do tests to monitor your condition and find the reason why the glands are still swollen and enlarged.  Check your affected lymph glands every  day for changes. Check other lymph gland areas as told by your health care provider. This information is not intended to replace advice given to you by your health care provider. Make sure you discuss any questions you have with your health care provider. Document Revised: 05/18/2017 Document Reviewed: 04/20/2017 Elsevier Patient Education   2020 [place].  Prediabetes Eating Plan Prediabetes is a condition that causes blood sugar (glucose) levels to be higher than normal. This increases the risk for developing diabetes. In order to prevent diabetes from developing, your health care provider may recommend a diet and other lifestyle changes to help you:  Control your blood glucose levels.  Improve your cholesterol levels.  Manage your blood pressure. Your health care provider may recommend working with a diet and nutrition specialist (dietitian) to make a meal plan that is best for you. What are tips for following this plan? Lifestyle  Set weight loss goals with the help of your health care team. It is recommended that most people with prediabetes lose 7% of their current body weight.  Exercise for at least 30 minutes at least 5 days a week.  Attend a support group or seek ongoing support from a mental health counselor.  Take over-the-counter and prescription medicines only as told by your health care provider. Reading food labels  Read food labels to check the amount of fat, salt (sodium), and sugar in prepackaged foods. Avoid foods that have: ? Saturated fats. ? Trans fats. ? Added sugars.  Avoid foods that have more than 300 milligrams (mg) of sodium per serving. Limit your daily sodium intake to less than 2,300 mg each day. Shopping  Avoid buying pre-made and processed foods. Cooking  Cook with olive oil. Do not use butter, lard, or ghee.  Bake, broil, grill, or boil foods. Avoid frying. Meal planning   Work with your dietitian to develop an eating plan that is right for you. This may include: ? Tracking how many calories you take in. Use a food diary, notebook, or mobile application to track what you eat at each meal. ? Using the glycemic index (GI) to plan your meals. The index tells you how quickly a food will raise your blood glucose. Choose low-GI foods. These foods take a longer time to raise blood  glucose.  Consider following a Mediterranean diet. This diet includes: ? Several servings each day of fresh fruits and vegetables. ? Eating fish at least twice a week. ? Several servings each day of whole grains, beans, nuts, and seeds. ? Using olive oil instead of other fats. ? Moderate alcohol consumption. ? Eating small amounts of red meat and whole-fat dairy.  If you have high blood pressure, you may need to limit your sodium intake or follow a diet such as the DASH eating plan. DASH is an eating plan that aims to lower high blood pressure. What foods are recommended? The items listed below may not be a complete list. Talk with your dietitian about what dietary choices are best for you. Grains Whole grains, such as whole-wheat or whole-grain breads, crackers, cereals, and pasta. Unsweetened oatmeal. Bulgur. Barley. Quinoa. Brown rice. Corn or whole-wheat flour tortillas or taco shells. Vegetables Lettuce. Spinach. Peas. Beets. Cauliflower. Cabbage. Broccoli. Carrots. Tomatoes. Squash. Eggplant. Herbs. Peppers. Onions. Cucumbers. Brussels sprouts. Fruits Berries. Bananas. Apples. Oranges. Grapes. Papaya. Mango. Pomegranate. Kiwi. Grapefruit. Cherries. Meats and other protein foods Seafood. Poultry without skin. Lean cuts of pork and beef. Tofu. Eggs. Nuts. Beans. Dairy Low-fat or fat-free dairy  products, such as yogurt, cottage cheese, and cheese. Beverages Water. Tea. Coffee. Sugar-free or diet soda. Seltzer water. Lowfat or no-fat milk. Milk alternatives, such as soy or almond milk. Fats and oils Olive oil. Canola oil. Sunflower oil. Grapeseed oil. Avocado. Walnuts. Sweets and desserts Sugar-free or low-fat pudding. Sugar-free or low-fat ice cream and other frozen treats. Seasoning and other foods Herbs. Sodium-free spices. Mustard. Relish. Low-fat, low-sugar ketchup. Low-fat, low-sugar barbecue sauce. Low-fat or fat-free mayonnaise. What foods are not recommended? The items  listed below may not be a complete list. Talk with your dietitian about what dietary choices are best for you. Grains Refined white flour and flour products, such as bread, pasta, snack foods, and cereals. Vegetables Canned vegetables. Frozen vegetables with butter or cream sauce. Fruits Fruits canned with syrup. Meats and other protein foods Fatty cuts of meat. Poultry with skin. Breaded or fried meat. Processed meats. Dairy Full-fat yogurt, cheese, or milk. Beverages Sweetened drinks, such as sweet iced tea and soda. Fats and oils Butter. Lard. Ghee. Sweets and desserts Baked goods, such as cake, cupcakes, pastries, cookies, and cheesecake. Seasoning and other foods Spice mixes with added salt. Ketchup. Barbecue sauce. Mayonnaise. Summary  To prevent diabetes from developing, you may need to make diet and other lifestyle changes to help control blood sugar, improve cholesterol levels, and manage your blood pressure.  Set weight loss goals with the help of your health care team. It is recommended that most people with prediabetes lose 7 percent of their current body weight.  Consider following a Mediterranean diet that includes plenty of fresh fruits and vegetables, whole grains, beans, nuts, seeds, fish, lean meat, low-fat dairy, and healthy oils. This information is not intended to replace advice given to you by your health care provider. Make sure you discuss any questions you have with your health care provider. Document Revised: 09/27/2018 Document Reviewed: 08/09/2016 Elsevier Patient Education  [place].  Thrombocytopenia Thrombocytopenia is a condition in which you have a low number of platelets in your blood. Platelets are also called thrombocytes. Platelets are tiny cells in the blood. When you bleed, they clump together at the cut or injury to stop the bleeding. This is called blood clotting. Not having enough platelets can cause bleeding problems. Some cases of  thrombocytopenia are mild while others are more severe. What are the causes? This condition may be caused by:  Decreased production of platelets. This may be caused by: ? Aplastic anemia. This is when your bone marrow stops making blood cells. ? Cancer in the bone marrow. ? Certain medicines, including chemotherapy. ? Infection in the bone marrow. ? Drinking a lot of alcohol.  Increased destruction of platelets. This may be caused by: ? Certain immune diseases. ? Certain medicines. ? Certain blood clotting disorders. ? Certain inherited disorders. ? Certain bleeding disorders. ? Pregnancy. ? Having an enlarged spleen (hypersplenism). In hypersplenism, the spleen gathers up platelets from circulation. This means that the platelets are not available to help with blood clotting. The spleen can be enlarged because of cirrhosis or other conditions. What are the signs or symptoms? Symptoms of this condition are the result of poor blood clotting. They will vary depending on how low the platelet counts are. Symptoms may include:  Abnormal bleeding.  Nosebleeds.  Heavy menstrual periods.  Blood in the urine or stool (feces).  A purplish discoloration in the skin (purpura).  Bruising.  A rash that looks like pinpoint, purplish-red spots (petechiae) on the skin and  mucous membranes. How is this diagnosed?  This condition may be diagnosed with blood tests and a physical exam. Sometimes, a sample of bone marrow may be removed to look for the original cells (megakaryocytes) that make platelets. Other tests may be needed depending on the cause. How is this treated? Treatment for this condition depends on the cause. Treatment options may include:  Treatment of another condition that is causing the low platelet count.  Medicines to help protect your platelets from being destroyed.  A replacement (transfusion) of platelets to stop or prevent bleeding.  Surgery to remove the  spleen. Follow these instructions at home: Activity  Avoid activities that could cause injury or bruising, and follow instructions about how to prevent falls.  Take extra care not to cut yourself when you shave or when you use scissors, needles, knives, and other tools.  Take extra care to protect yourself from burns when ironing or cooking. General instructions   Check your skin and the inside of your mouth for bruising or bleeding as told by your health care provider.  Check your spit (sputum), urine, and stool for blood as told by your health care provider.  Do not drink alcohol.  Take over-the-counter and prescription medicines only as told by your health care provider.  Do not take any medicines that have aspirin or NSAIDs in them. These medicines can thin your blood and cause you to bleed more easily.  Tell all your health care providers, including dentists and eye doctors, about your condition. Contact a health care provider if you have:  Unexplained bruising. Get help right away if you have:  Active bleeding from anywhere on your body.  Blood in your sputum, urine, or stool. Summary  Thrombocytopenia is a condition in which you have a low number of platelets in your blood.  Platelets are needed for blood clotting.  Symptoms of this condition are the result of poor blood clotting and may include abnormal bleeding, nosebleeds, and bruising.  This condition may be diagnosed with blood tests and a physical exam.  Treatment for this condition depends on the cause. This information is not intended to replace advice given to you by your health care provider. Make sure you discuss any questions you have with your health care provider. Document Revised: 03/07/2018 Document Reviewed: 03/07/2018 Elsevier Patient Education  [place].

## 2019-10-17 ENCOUNTER — Other Ambulatory Visit: Payer: Self-pay | Admitting: Family Medicine

## 2019-10-17 DIAGNOSIS — H66002 Acute suppurative otitis media without spontaneous rupture of ear drum, left ear: Secondary | ICD-10-CM

## 2019-10-17 MED ORDER — AMOXICILLIN 500 MG PO TABS: 500.0000 mg | ORAL_TABLET | Freq: Two times a day (BID) | ORAL | 0 refills | Status: AC

## 2019-10-17 NOTE — Progress Notes (Signed)
S: Pt with L ear pain x several days.  Denies fever, chills, neck pain, sore throat, headache.  Endorses lymphadenopathy s/p second [employer] Covid vaccine on 4/17. O: Right TM normal.  Left TM full with purulent fluid present.  No erythema in canal. A/P: Acute otitis media, suppurative, not recurrent -Amoxicillin 500 mg twice daily x7 days -Tylenol as needed for pain or discomfort -Given precautions  Grier Mitts, MD

## 2019-11-09 DIAGNOSIS — Z03818 Encounter for observation for suspected exposure to other biological agents ruled out: Secondary | ICD-10-CM | POA: Diagnosis not present

## 2019-11-09 DIAGNOSIS — Z20828 Contact with and (suspected) exposure to other viral communicable diseases: Secondary | ICD-10-CM | POA: Diagnosis not present

## 2019-12-04 DIAGNOSIS — Z20822 Contact with and (suspected) exposure to covid-19: Secondary | ICD-10-CM | POA: Diagnosis not present

## 2019-12-04 DIAGNOSIS — Z03818 Encounter for observation for suspected exposure to other biological agents ruled out: Secondary | ICD-10-CM | POA: Diagnosis not present

## 2019-12-29 MED ORDER — LEVOTHYROXINE SODIUM 125 MCG PO TABS: 125.0000 ug | ORAL_TABLET | Freq: Every day | ORAL | 1 refills | Status: DC

## 2019-12-29 MED FILL — LEVOTHYROXINE 125 MCG TABLE: 125 | 90 days supply | Qty: 90 | Fill #0

## 2020-03-15 ENCOUNTER — Other Ambulatory Visit: Payer: 59

## 2020-03-15 DIAGNOSIS — Z20822 Contact with and (suspected) exposure to covid-19: Secondary | ICD-10-CM

## 2020-03-17 ENCOUNTER — Encounter: Payer: Self-pay | Admitting: Internal Medicine

## 2020-03-17 DIAGNOSIS — U071 COVID-19: Secondary | ICD-10-CM | POA: Insufficient documentation

## 2020-03-17 LAB — SARS-COV-2, NAA 2 DAY TAT

## 2020-03-17 LAB — NOVEL CORONAVIRUS, NAA: SARS-CoV-2, NAA: DETECTED — AB

## 2020-03-17 NOTE — Telephone Encounter (Signed)
Pt wants to know what he needs to do besides quarantine. He isn't having any symptoms besides of loss of taste and smell.

## 2020-03-18 ENCOUNTER — Other Ambulatory Visit: Payer: Self-pay | Admitting: Internal Medicine

## 2020-03-18 DIAGNOSIS — U071 COVID-19: Secondary | ICD-10-CM

## 2020-03-18 NOTE — Progress Notes (Signed)
I connected by phone with Douglas Harper on 03/18/2020 at 7:32 PM to discuss the potential use of a new treatment for mild to moderate COVID-19 viral infection in non-hospitalized patients.  This patient is a 49 y.o. male that meets the FDA criteria for Emergency Use Authorization of COVID monoclonal antibody casirivimab/imdevimab or bamlanivimab/eteseviamb.  Has a (+) direct SARS-CoV-2 viral test result  Has mild or moderate COVID-19   Is NOT hospitalized due to COVID-19  Is within 10 days of symptom onset  Has at least one of the high risk factor(s) for progression to severe COVID-19 and/or hospitalization as defined in EUA.  Specific high risk criteria : BMI > 25   I have spoken and communicated the following to the patient or parent/caregiver regarding COVID monoclonal antibody treatment:  1. FDA has authorized the emergency use for the treatment of mild to moderate COVID-19 in adults and pediatric patients with positive results of direct SARS-CoV-2 viral testing who are 66 years of age and older weighing at least 40 kg, and who are at high risk for progressing to severe COVID-19 and/or hospitalization.  2. The significant known and potential risks and benefits of COVID monoclonal antibody, and the extent to which such potential risks and benefits are unknown.  3. Information on available alternative treatments and the risks and benefits of those alternatives, including clinical trials.  4. Patients treated with COVID monoclonal antibody should continue to self-isolate and use infection control measures (e.g., wear mask, isolate, social distance, avoid sharing personal items, clean and disinfect "high touch" surfaces, and frequent handwashing) according to CDC guidelines.   5. The patient or parent/caregiver has the option to accept or refuse COVID monoclonal antibody treatment.  After reviewing this information with the patient, the patient has agreed to receive one of the available  covid 19 monoclonal antibodies and will be provided an appropriate fact sheet prior to infusion.  [name], [place]

## 2020-03-19 ENCOUNTER — Ambulatory Visit (HOSPITAL_COMMUNITY)
Admission: RE | Admit: 2020-03-19 | Discharge: 2020-03-19 | Disposition: A | Discharge status: Home / Self Care | Payer: 59 | Source: Ambulatory Visit | Attending: Pulmonary Disease | Admitting: Pulmonary Disease

## 2020-03-19 DIAGNOSIS — U071 COVID-19: Secondary | ICD-10-CM | POA: Insufficient documentation

## 2020-03-19 MED ORDER — DIPHENHYDRAMINE HCL 50 MG/ML IJ SOLN: 50.0000 mg | Freq: Once | INTRAMUSCULAR | Status: DC | PRN

## 2020-03-19 MED ORDER — EPINEPHRINE 0.3 MG/0.3ML IJ SOAJ: 0.3000 mg | Freq: Once | INTRAMUSCULAR | Status: DC | PRN

## 2020-03-19 MED ORDER — SODIUM CHLORIDE 0.9 % IV SOLN: INTRAVENOUS | Status: DC | PRN

## 2020-03-19 MED ORDER — METHYLPREDNISOLONE SODIUM SUCC 125 MG IJ SOLR: 125.0000 mg | Freq: Once | INTRAMUSCULAR | Status: DC | PRN

## 2020-03-19 MED ORDER — ALBUTEROL SULFATE HFA 108 (90 BASE) MCG/ACT IN AERS: 2.0000 | INHALATION_SPRAY | Freq: Once | RESPIRATORY_TRACT | Status: DC | PRN

## 2020-03-19 MED ORDER — FAMOTIDINE IN NACL 20-0.9 MG/50ML-% IV SOLN: 20.0000 mg | Freq: Once | INTRAVENOUS | Status: DC | PRN

## 2020-03-19 MED ORDER — SODIUM CHLORIDE 0.9 % IV SOLN
1200.0000 mg | Freq: Once | INTRAVENOUS | Status: AC
  Administered 2020-03-19: 1200 mg via INTRAVENOUS

## 2020-03-19 NOTE — Discharge Instructions (Signed)

## 2020-03-19 NOTE — Progress Notes (Signed)
°  Diagnosis: COVID-19 ° °Physician:Dr Wright  ° °Procedure: [facility] Infusion [facility] Med: casirivimab\imdevimab infusion - Provided patient with casirivimab\imdevimab fact sheet for patients, parents and caregivers prior to infusion. ° °Complications: No immediate complications noted. ° °Discharge: Discharged home  ° °Douglas Harper °03/19/2020 ° °

## 2020-03-29 ENCOUNTER — Other Ambulatory Visit: Payer: Self-pay | Admitting: Internal Medicine

## 2020-03-29 DIAGNOSIS — E890 Postprocedural hypothyroidism: Secondary | ICD-10-CM

## 2020-03-29 MED ORDER — LEVOTHYROXINE SODIUM 125 MCG PO TABS: 125.0000 ug | ORAL_TABLET | Freq: Every day | ORAL | 3 refills | Status: DC

## 2020-03-29 MED FILL — LEVOTHYROXINE 125 MCG TABLE: 125 | 90 days supply | Qty: 90 | Fill #0

## 2020-03-30 ENCOUNTER — Other Ambulatory Visit: Payer: Self-pay

## 2020-03-30 ENCOUNTER — Ambulatory Visit (INDEPENDENT_AMBULATORY_CARE_PROVIDER_SITE_OTHER): Payer: 59 | Admitting: *Deleted

## 2020-03-30 DIAGNOSIS — Z23 Encounter for immunization: Secondary | ICD-10-CM | POA: Diagnosis not present

## 2020-04-23 ENCOUNTER — Encounter: Payer: 59 | Admitting: Internal Medicine

## 2020-05-28 ENCOUNTER — Encounter: Payer: 59 | Admitting: Internal Medicine

## 2020-06-01 ENCOUNTER — Encounter: Payer: Self-pay | Admitting: Internal Medicine

## 2020-06-28 MED FILL — LEVOTHYROXINE 125 MCG TAB: 125 | 90 days supply | Qty: 90 | Fill #1

## 2020-07-16 IMAGING — MR MR CARD MORPHOLOGY WO/W CM
32 of 34 series · 36 of 40 positions shown · IV contrast (Gadavist)
Comparison: none

CLINICAL DATA: Evaluate for HCM

EXAM:
CARDIAC MRI
TECHNIQUE: The patient was scanned on a 1.5 Tesla Siemens magnet. A dedicated
cardiac coil was used. Functional imaging was done using Fiesta
sequences. [DATE], and 4 chamber views were done to assess for RWMA's.
Modified [place] rule using a short axis stack was used to
calculate an ejection fraction on a dedicated work station using
Circle software. The patient received 12 cc of Gadavist. After 10
minutes inversion recovery sequences were used to assess for
infiltration and scar tissue.
CONTRAST:  12 cc  of Gadavist

[Series 6: bSSFP · oblique · 8.0mm · 1.61mm/px · 2 of 25 slices shown (1 of 20)]
[im 1/25]
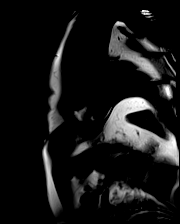
[im 25/25]
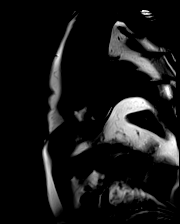

[Series 6: bSSFP · oblique · 8.0mm · 1.61mm/px · 1 of 25 slices shown (2 of 20)]
[im 1/25]
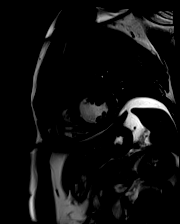

[Series 6: bSSFP · oblique · 8.0mm · 1.61mm/px · 1 of 25 slices shown (3 of 20)]
[im 1/25]
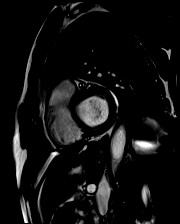

[Series 6: bSSFP · oblique · 8.0mm · 1.61mm/px · 1 of 25 slices shown (4 of 20)]
[im 1/25]
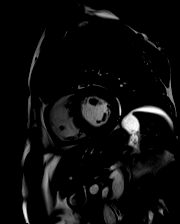

[Series 6: bSSFP · oblique · 8.0mm · 1.61mm/px · 1 of 25 slices shown (5 of 20)]
[im 1/25]
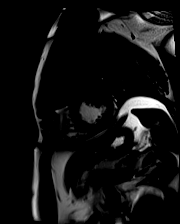

[Series 6: bSSFP · oblique · 8.0mm · 1.61mm/px · 1 of 25 slices shown (6 of 20)]
[im 1/25]
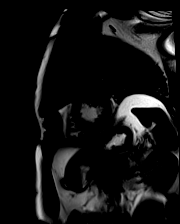

[Series 6: bSSFP · oblique · 8.0mm · 1.61mm/px · 1 of 25 slices shown (7 of 20)]
[im 1/25]
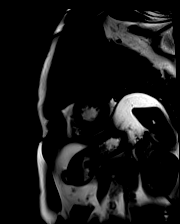

[Series 6: bSSFP · oblique · 8.0mm · 1.61mm/px · 1 of 25 slices shown (8 of 20)]
[im 1/25]
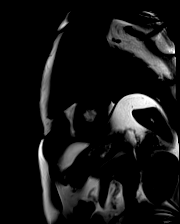

[Series 6: bSSFP · oblique · 8.0mm · 1.61mm/px · 1 of 25 slices shown (9 of 20)]
[im 1/25]
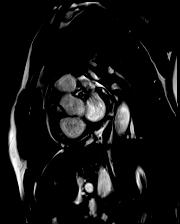

[Series 6: bSSFP · oblique · 8.0mm · 1.61mm/px · 1 of 25 slices shown (10 of 20)]
[im 1/25]
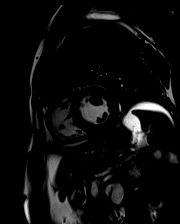

[Series 6: bSSFP · oblique · 8.0mm · 1.61mm/px · 1 of 25 slices shown (11 of 20)]
[im 1/25]
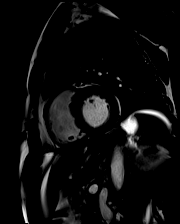

[Series 6: bSSFP · oblique · 8.0mm · 1.61mm/px · 2 of 25 slices shown (12 of 20)]
[im 1/25]
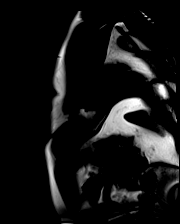
[im 25/25]
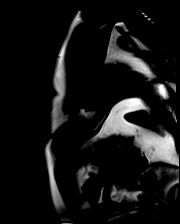

[Series 6: bSSFP · oblique · 8.0mm · 1.61mm/px · 1 of 25 slices shown (13 of 20)]
[im 1/25]
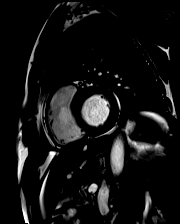

[Series 6: bSSFP · oblique · 8.0mm · 1.61mm/px · 1 of 25 slices shown (14 of 20)]
[im 1/25]
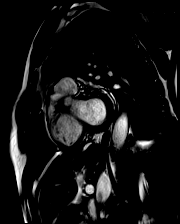

[Series 6: bSSFP · oblique · 8.0mm · 1.61mm/px · 2 of 25 slices shown (15 of 20)]
[im 1/25]
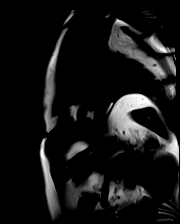
[im 25/25]
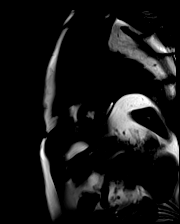

[Series 6: bSSFP · oblique · 8.0mm · 1.61mm/px · 1 of 25 slices shown (16 of 20)]
[im 1/25]
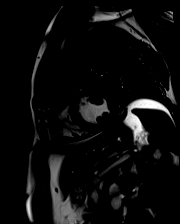

[Series 6: bSSFP · oblique · 8.0mm · 1.61mm/px · 1 of 25 slices shown (17 of 20)]
[im 1/25]
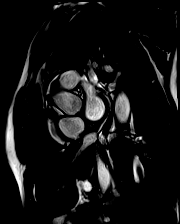

[Series 7: t2_stir_db_sax · oblique · 8.0mm · 1.73mm/px · 1 of 17 slices shown]
[im 1/17]
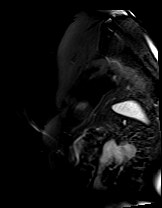

[Series 8: t2_stir_db_radial ((date)ch) · axial · 6.0mm · 1.73mm/px · 1 of 2 slices shown]
[im 1/2]
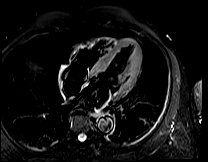

[Series 11: (id)_long_t1_moco · oblique · 8.0mm · 1.41mm/px · 1 of 24 slices shown]
[im 1/24]
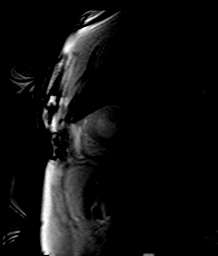

[Series 12: (id)_long_t1_moco_t1 · oblique · 8.0mm · 1.41mm/px · 1 of 6 slices shown]
[im 1/6]
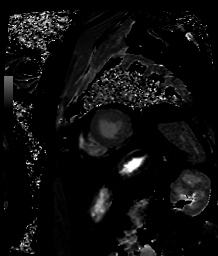

[Series 14: bSSFP · oblique · 7.0mm · 1.41mm/px · 1 of 25 slices shown (18 of 20)]
[im 1/25]
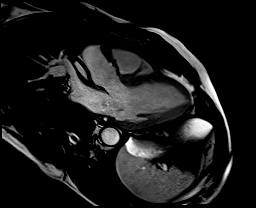

[Series 15: bSSFP · axial · 7.0mm · 1.41mm/px · 1 of 25 slices shown (19 of 20)]
[im 1/25]
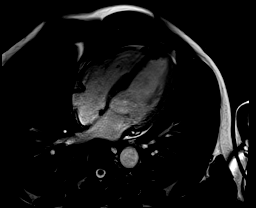

[Series 16: bSSFP · coronal · 7.0mm · 1.41mm/px · 1 of 25 slices shown (20 of 20)]
[im 1/25]
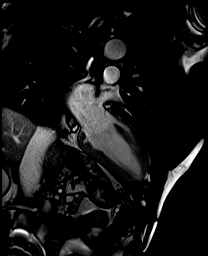

[Series 18: lge_single shot sa · oblique · 8.0mm · 1.98mm/px · 1 of 17 slices shown (1 of 2)]
[im 1/17]
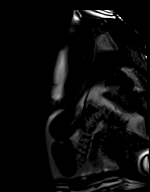

[Series 19: lge_single shot sa · oblique · 8.0mm · 1.98mm/px · 1 of 17 slices shown (2 of 2)]
[im 1/17]
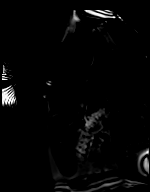

[Series 27: (id)_short_t1_moco · oblique · 8.0mm · 1.41mm/px · 2 of 27 slices shown]
[im 1/27]
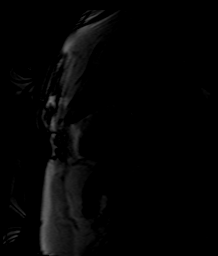
[im 27/27]
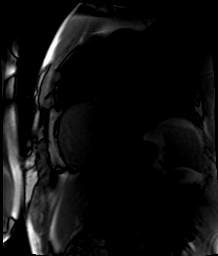

[Series 28: (id)_short_t1_moco_t1 · oblique · 8.0mm · 1.41mm/px · 1 of 4 slices shown]
[im 1/4]
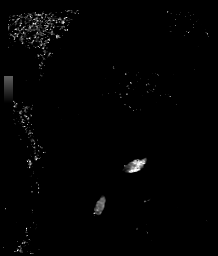

[Series 34: lge short axis_mag · oblique · 8.0mm · 1.61mm/px · 1 of 17 slices shown]
[im 1/17]
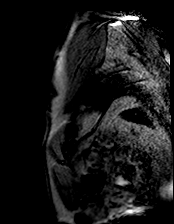

[Series 35: lge short axis_psir · oblique · 8.0mm · 1.61mm/px · 1 of 17 slices shown]
[im 1/17]
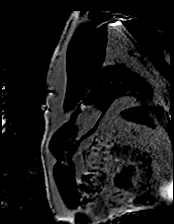

[Series 38: lge radial ((date)ch)_mag · axial · 6.0mm · 1.61mm/px · 1 of 1 slices shown]
[im 1/1]
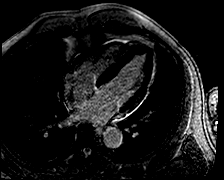

[Series 39: lge radial ((date)ch)_psir · axial · 6.0mm · 1.61mm/px · 1 of 1 slices shown]
[im 1/1]
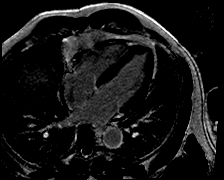

[36 of 40 positions shown; findings below may reference images not displayed]

FINDINGS: Left ventricle:

- Basal septal hypertrophy measuring up to 15mm (lateral wall 11mm)

- Normal size

- Hyperdynamic systolic function

- RV insertion site LGE

LV EF: 71% (Normal 56-78%)

Absolute volumes:

LV EDV: 160mL (Normal 77-195 mL)

LV ESV: 47mL (Normal 19-72 mL)

LV SV: 113mL (Normal 51-133 mL)

CO: 6.8L/min (Normal 2.8-8.8 L/min)

Indexed volumes:

LV EDV: 68mL/sq-m (Normal 47-92 mL/sq-m)

LV ESV: 20mL/sq-m (Normal 13-30 mL/sq-m)

LV SV: 48mL/sq-m (Normal 32-62 mL/sq-m)

CI: 2.9L/min/sq-m (Normal 1.7-4.2 L/min/sq-m)

Right ventricle: Normal size and systolic function

RV EF:  57% (Normal 47-74%)

Absolute volumes:

RV EDV: 185mL (Normal 88-227 mL)

RV ESV: 79mL (Normal 23-103 mL)

RV SV: 105mL (Normal 52-138 mL)

CO: 6.4L/min (Normal 2.8-8.8 L/min)

Indexed volumes:

RV EDV: 79mL/sq-m (Normal 55-105 mL/sq-m)

RV ESV: 34mL/sq-m (Normal 15-43 mL/sq-m)

RV SV: 45mL/sq-m (Normal 32-64 mL/sq-m)

CI: 2.7L/min/sq-m (Normal 1.7-4.2 L/min/sq-m)

Left atrium: Mild enlargement

Right atrium: Mild enlargement

Mitral valve: Trivial regurgitation

Aortic valve: No regurgitation

Tricuspid valve: No regurgitation

Pericardium: Trivial pericardial effusion
IMPRESSION: 1. Asymmetric hypertrophy of the basal septum measuring up to 15mm
(lateral wall 11mm), consistent with hypertrophic cardiomyopathy

2. RV insertion site late gadolinium enhancement, which is commonly
seen in hypertrophic cardiomyopathy

3.  Normal LV size with hyperdynamic systolic function (EF 71%)

4.  Normal RV size and systolic function (EF 57%)

## 2020-11-19 ENCOUNTER — Other Ambulatory Visit (HOSPITAL_COMMUNITY): Payer: Self-pay

## 2020-11-19 MED FILL — Levothyroxine Sodium Tab 125 MCG: ORAL | 90 days supply | Qty: 90 | Fill #0 | Status: AC

## 2021-02-22 ENCOUNTER — Other Ambulatory Visit (HOSPITAL_COMMUNITY): Payer: Self-pay

## 2021-02-22 MED FILL — Levothyroxine Sodium Tab 125 MCG: ORAL | 90 days supply | Qty: 90 | Fill #1 | Status: AC

## 2021-05-17 NOTE — Progress Notes (Signed)
Acute Office Visit  Subjective:    Patient ID: Douglas Harper, male    DOB: Apr 24, 1971, 50 y.o.   MRN: [id number]  Chief Complaint  Patient presents with   Back Pain    Lower back    Back Pain This is a new problem. The current episode started 1 to 4 weeks ago (2 weeks now, started working at [employer] lifting and climbing stairs.). The problem has been gradually improving since onset. The pain is present in the lumbar spine. The quality of the pain is described as shooting and aching. The pain is at a severity of 2/10 (currently 5/10, 2/10 at least pain and greatest pain has had  10/10 pain). The pain is moderate. The symptoms are aggravated by bending, position and standing. Stiffness is present All day. Pertinent negatives include no abdominal pain, bladder incontinence, bowel incontinence, chest pain, dysuria, fever, headaches, leg pain, numbness, paresis, paresthesias, pelvic pain, perianal numbness, tingling, weakness or weight loss. He has tried heat, ice and NSAIDs for the symptoms. The treatment provided mild relief.   Denies any other associated symptoms. Denies any rectal pain/ pressure or any symptoms urinary.  Denies any testicular pain or trauma.  Reports he is urinating normally.  Still has been working.  Denies any abdominal pain, fever.  Reports he has been doing well until he started this new job last 2 weeks at [employer] lifting close including stairs.  He denies any known injury. He has tried ibuprofen and Tylenol with some relief. Patient  denies any fever, chills, rash, chest pain, shortness of breath, nausea, vomiting, or diarrhea.  Denies any syncopal episodes.  Denies any presyncopal episodes.  Past Medical History:  Diagnosis Date   COVID-19    03/17/20   FH: heart disease    GERD (gastroesophageal reflux disease)    Glaucoma    Hyperthyroidism    s/p RAI 2009    Hypothyroidism    Verruca vulgaris     Past Surgical History:  Procedure Laterality Date   ANTERIOR  CRUCIATE LIGAMENT REPAIR Right 2000   HAND SURGERY Right 2001   with hardware implanted   NO PAST SURGERIES      Family History  Problem Relation Age of Onset   Heart disease Mother    Hypertension Mother    Diabetes Mother    Colon cancer Neg Hx    Esophageal cancer Neg Hx    Pancreatic cancer Neg Hx    Liver disease Neg Hx    Rectal cancer Neg Hx    Stomach cancer Neg Hx     Social History   Socioeconomic History   Marital status: Single    Spouse name: Not on file   Number of children: Not on file   Years of education: Not on file   Highest education level: Not on file  Occupational History   Not on file  Tobacco Use   Smoking status: Never   Smokeless tobacco: Never  Substance and Sexual Activity   Alcohol use: Yes    Comment: socially   Drug use: Never   Sexual activity: Not on file  Other Topics Concern   Not on file  Social History Narrative   Married wife Edison Nicholson DPR    From [place] been here since 50 y.o    Works Designer, [employer]    No tobacco, drugs, occas. etoh    8 siblings   Social Determinants of Health   Financial Resource Strain: Not  on file  Food Insecurity: Not on file  Transportation Needs: Not on file  Physical Activity: Not on file  Stress: Not on file  Social Connections: Not on file  Intimate Partner Violence: Not on file    Outpatient Medications Prior to Visit  Medication Sig Dispense Refill   levothyroxine (SYNTHROID) 125 MCG tablet TAKE 1 TABLET BY MOUTH 30 MINUTES BEFORE BREAKFAST (STOP 112MCG DOSE) 90 tablet 3   Cetirizine HCl (ZYRTEC PO) Take by mouth. (Patient not taking: Reported on 05/18/2021)     No facility-administered medications prior to visit.    No Known Allergies  Review of Systems  Constitutional: Negative.  Negative for fever and weight loss.  HENT: Negative.    Respiratory: Negative.    Cardiovascular: Negative.  Negative for chest pain.  Gastrointestinal: Negative.  Negative for  abdominal pain and bowel incontinence.  Genitourinary: Negative.  Negative for bladder incontinence, dysuria and pelvic pain.  Musculoskeletal:  Positive for back pain. Negative for arthralgias, gait problem, joint swelling, myalgias, neck pain and neck stiffness.  Skin: Negative.   Neurological: Negative.  Negative for tingling, weakness, numbness, headaches and paresthesias.  Psychiatric/Behavioral: Negative.        Objective:    Physical Exam Constitutional:      General: He is not in acute distress.    Appearance: Normal appearance. He is well-developed. He is not ill-appearing, toxic-appearing or diaphoretic.     Comments: Patient appers well, not sickly. Speaking in complete sentences. Patient moves on and off of exam table and in room without difficulty. Gait is normal in hall and in room. Patient is oriented to person place time and situation. Patient answers questions appropriately and engages eye contact and verbal dialect with provider.    HENT:     Head: Normocephalic and atraumatic.     Right Ear: Hearing, tympanic membrane, ear canal and external ear normal.     Left Ear: Hearing, tympanic membrane, ear canal and external ear normal.     Nose: Nose normal.     Mouth/Throat:     Mouth: Mucous membranes are moist.     Pharynx: Uvula midline. No oropharyngeal exudate or posterior oropharyngeal erythema.  Eyes:     General: Lids are normal. No scleral icterus.       Right eye: No discharge.        Left eye: No discharge.     Extraocular Movements: Extraocular movements intact.     Conjunctiva/sclera: Conjunctivae normal.     Pupils: Pupils are equal, round, and reactive to light.  Neck:     Thyroid: No thyromegaly.     Vascular: Normal carotid pulses. No carotid bruit, hepatojugular reflux or JVD.     Trachea: Trachea and phonation normal. No tracheal tenderness or tracheal deviation.     Meningeal: Brudzinski's sign absent.  Cardiovascular:     Rate and Rhythm: Normal  rate and regular rhythm.     Pulses: Normal pulses.     Heart sounds: Normal heart sounds, S1 normal and S2 normal. Heart sounds not distant. No murmur heard.   No friction rub. No gallop.  Pulmonary:     Effort: Pulmonary effort is normal. No accessory muscle usage or respiratory distress.     Breath sounds: Normal breath sounds. No stridor. No wheezing, rhonchi or rales.  Chest:     Chest wall: No tenderness.  Abdominal:     General: Abdomen is flat. Bowel sounds are normal. There is no distension or abdominal bruit.  There are no signs of injury.     Palpations: Abdomen is soft. There is no shifting dullness, fluid wave, hepatomegaly, splenomegaly, mass or pulsatile mass.     Tenderness: There is no abdominal tenderness. There is no right CVA tenderness, left CVA tenderness, guarding or rebound. Negative signs include Murphy's sign, Rovsing's sign, McBurney's sign and psoas sign.     Hernia: No hernia is present.     Comments: Abdominal exam within normal limits.   Musculoskeletal:        General: No deformity. Normal range of motion.     Cervical back: Normal, full passive range of motion without pain, normal range of motion and neck supple.     Thoracic back: Normal.     Lumbar back: Spasms and tenderness present. Negative right straight leg raise test and negative left straight leg raise test.       Back:     Comments: Lumbar sacral tenderness and spasm noted worse on right noted on left.   Lymphadenopathy:     Head:     Right side of head: No submental, submandibular, tonsillar, preauricular, posterior auricular or occipital adenopathy.     Left side of head: No submental, submandibular, tonsillar, preauricular, posterior auricular or occipital adenopathy.     Cervical: No cervical adenopathy.  Skin:    General: Skin is warm and dry.     Coloration: Skin is not pale.     Findings: No erythema or rash.     Nails: There is no clubbing.  Neurological:     Mental Status: He is  alert and oriented to person, place, and time.     GCS: GCS eye subscore is 4. GCS verbal subscore is 5. GCS motor subscore is 6.     Cranial Nerves: No cranial nerve deficit.     Sensory: No sensory deficit.     Motor: No abnormal muscle tone.     Coordination: Coordination normal.     Deep Tendon Reflexes: Reflexes are normal and symmetric. Reflexes normal.  Psychiatric:        Speech: Speech normal.        Behavior: Behavior normal.        Thought Content: Thought content normal.        Judgment: Judgment normal.    BP (!) 142/90   Pulse 74   Temp (!) 96.1 F (35.6 C) (Skin)   Ht 6\' 3"  (1.905 m)   Wt 227 lb 6.4 oz (103.1 kg)   SpO2 97%   BMI 28.42 kg/m  Wt Readings from Last 3 Encounters:  05/18/21 227 lb 6.4 oz (103.1 kg)  10/10/19 220 lb 12.8 oz (100.2 kg)  05/29/19 232 lb (105.2 kg)    Health Maintenance Due  Topic Date Due   HIV Screening  Never done   Hepatitis C Screening  Never done   COVID-19 Vaccine (3 - Booster for Moderna series) 11/29/2019   Zoster Vaccines- Shingrix (1 of 2) Never done    There are no preventive care reminders to display for this patient.   Lab Results  Component Value Date   TSH 2.76 09/24/2019   Lab Results  Component Value Date   WBC 4.4 09/24/2019   HGB 12.8 (L) 09/24/2019   HCT 40.7 09/24/2019   MCV 63.6 Repeated and verified X2. (L) 09/24/2019   PLT 146.0 (L) 09/24/2019   Lab Results  Component Value Date   NA 140 09/24/2019   K 3.8 09/24/2019   CO2  29 09/24/2019   GLUCOSE 113 (H) 09/24/2019   BUN 11 09/24/2019   CREATININE 1.24 09/24/2019   BILITOT 1.4 (H) 09/24/2019   ALKPHOS 52 09/24/2019   AST 24 09/24/2019   ALT 37 09/24/2019   PROT 7.1 09/24/2019   ALBUMIN 4.3 09/24/2019   CALCIUM 9.3 09/24/2019   GFR 74.79 09/24/2019   Lab Results  Component Value Date   CHOL 172 09/24/2019   Lab Results  Component Value Date   HDL 51.20 09/24/2019   Lab Results  Component Value Date   LDLCALC 107 (H)  09/24/2019   Lab Results  Component Value Date   TRIG 68.0 09/24/2019   Lab Results  Component Value Date   CHOLHDL 3 09/24/2019   Lab Results  Component Value Date   HGBA1C 5.6 09/24/2019       Assessment & Plan:   Problem List Items Addressed This Visit       Other   Acute right-sided low back pain without sciatica - Primary   Relevant Medications   predniSONE (STERAPRED UNI-PAK 21 TAB) 10 MG (21) TBPK tablet   tiZANidine (ZANAFLEX) 4 MG capsule   ketorolac (TORADOL) injection 60 mg   Other Relevant Orders   CBC with Differential/Platelet   Comprehensive metabolic panel   DG Lumbar Spine Complete   DG Abd 1 View   Spasm of lumbar paraspinous muscle     Meds ordered this encounter  Medications   predniSONE (STERAPRED UNI-PAK 21 TAB) 10 MG (21) TBPK tablet    Sig: PO: Take 6 tablets on day 1:Take 5 tablets day 2:Take 4 tablets day 3: Take 3 tablets day 4:Take 2 tablets day five: 5 Take 1 tablet day 6    Dispense:  21 tablet    Refill:  0   tiZANidine (ZANAFLEX) 4 MG capsule    Sig: Take 1 capsule (4 mg total) by mouth 3 (three) times daily as needed for muscle spasms.    Dispense:  21 capsule    Refill:  0   ketorolac (TORADOL) injection 60 mg  Lumbar x-ray now Avoid NSAIDs while taking the prednisone Dosepak.  Suspect muscular origin given patient's symptoms and exam.  Abdominal exam is negative.  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  We will check a KUB as well. He has no urinary or testicular concerns.  Tylenol as needed for pain Will give Toradol injection in the office today, patient will be given muscle relaxer to try as needed recommend him try it at bedtime first as may cause drowsiness. Strict red flag precautions given and when to go to the emergency room immediately call 911 as discussed. Red Flags discussed. The patient was given clear instructions to go to  ER or return to medical center if any red flags develop, symptoms do not improve, worsen or new problems develop. They verbalized understanding.  Return in about 5 days (around 05/23/2021), or if symptoms worsen or fail to improve, for at any time for any worsening symptoms, Go to Emergency room/ urgent care if worse.   Marcille Buffy, FNP

## 2021-05-18 ENCOUNTER — Encounter: Payer: Self-pay | Admitting: Adult Health

## 2021-05-18 ENCOUNTER — Ambulatory Visit (INDEPENDENT_AMBULATORY_CARE_PROVIDER_SITE_OTHER): Payer: Self-pay

## 2021-05-18 ENCOUNTER — Other Ambulatory Visit: Payer: Self-pay

## 2021-05-18 ENCOUNTER — Ambulatory Visit (INDEPENDENT_AMBULATORY_CARE_PROVIDER_SITE_OTHER): Payer: Self-pay | Admitting: Adult Health

## 2021-05-18 VITALS — BP 142/90 | HR 74 | Temp 96.1°F | Ht 75.0 in | Wt 227.4 lb

## 2021-05-18 DIAGNOSIS — R109 Unspecified abdominal pain: Secondary | ICD-10-CM | POA: Diagnosis not present

## 2021-05-18 DIAGNOSIS — M62830 Muscle spasm of back: Secondary | ICD-10-CM | POA: Insufficient documentation

## 2021-05-18 DIAGNOSIS — M545 Low back pain, unspecified: Secondary | ICD-10-CM

## 2021-05-18 MED ORDER — TIZANIDINE HCL 4 MG PO CAPS: 4.0000 mg | ORAL_CAPSULE | Freq: Three times a day (TID) | ORAL | 0 refills | Status: DC | PRN

## 2021-05-18 MED ORDER — PREDNISONE 10 MG (21) PO TBPK: ORAL_TABLET | ORAL | 0 refills | Status: DC

## 2021-05-18 MED ORDER — KETOROLAC TROMETHAMINE 60 MG/2ML IM SOLN: 60.0000 mg | Freq: Once | INTRAMUSCULAR | Status: AC

## 2021-05-18 NOTE — Patient Instructions (Signed)
Tizanidine Capsules or Tablets What is this medication? TIZANIDINE (tye ZAN i deen) treats muscle spasms. It works by relaxing your muscles, which reduces muscle stiffness. It belongs to a group of medications called muscle relaxants. This medicine may be used for other purposes; ask your health care provider or pharmacist if you have questions. COMMON BRAND NAME(S): Zanaflex What should I tell my care team before I take this medication? They need to know if you have any of these conditions: Kidney disease Liver disease Low blood pressure Mental health disease An unusual or allergic reaction to tizanidine, other medications, foods, dyes, or preservatives Pregnant or trying to get pregnant Breast-feeding How should I use this medication? Take this medication by mouth with water. Take it as directed on the prescription label. You can take it with or without food. You should always take it the same way. Keep taking this medication unless your care team tells you to stop. Stopping it too quickly can cause serious side effects. Talk to your care team about the use of this medication in children. Special care may be needed. Patients over 46 years of age may have a stronger reaction and need a smaller dose. Overdosage: If you think you have taken too much of this medicine contact a poison control center or emergency room at once. NOTE: This medicine is only for you. Do not share this medicine with others. What if I miss a dose? If you miss a dose, take it as soon as you can. If it is almost time for your next dose, take only that dose. Do not take double or extra doses. What may interact with this medication? Do not take this medication with any of the following: Ciprofloxacin Fluvoxamine Narcotic medications for cough Thiabendazole Viloxazine This medication may also interact with the following: Acyclovir Alcohol Antihistamines for allergy, cough, and cold Baclofen Birth control  pills Certain medications for anxiety or sleep Certain medications for blood pressure, heart disease, irregular heartbeat like amiodarone, mexiletine, propafenone, verapamil Certain medications for depression like amitriptyline, fluoxetine, sertraline Certain medications for seizures like phenobarbital, primidone Cimetidine Clonidine Famotidine General anesthetics like halothane, isoflurane, methoxyflurane, propofol Guanfacine Medications for sleep Medications that relax muscles for surgery Methyldopa Narcotic medications for pain Phenothiazines like chlorpromazine, prochlorperazine, thioridazine Ticlopidine Zileuton This list may not describe all possible interactions. Give your health care provider a list of all the medicines, herbs, non-prescription drugs, or dietary supplements you use. Also tell them if you smoke, drink alcohol, or use illegal drugs. Some items may interact with your medicine. What should I watch for while using this medication? Visit your care team for regular checks on your progress. Tell your care team if your symptoms do not start to get better or if they get worse. You may get drowsy or dizzy. Do not drive, use machinery, or do anything that needs mental alertness until you know how this medication affects you. Do not stand up or sit up quickly, especially if you are an older patient. This reduces the risk of dizzy or fainting spells. Alcohol may interfere with the effects of this medication. Avoid alcoholic drinks. Your mouth may get dry. Chewing sugarless gum or sucking hard candy and drinking plenty of water may help. Contact your care team if the problem does not go away or is severe. What side effects may I notice from receiving this medication? Side effects that you should report to your care team as soon as possible: Allergic reactions--skin rash, itching, hives, swelling of the  face, lips, tongue, or throat CNS depression--slow or shallow breathing,  shortness of breath, feeling faint, dizziness, confusion, trouble staying awake Hallucinations Liver injury--right upper belly pain, loss of appetite, nausea, light-colored stool, dark yellow or brown urine, yellowing skin or eyes, unusual weakness or fatigue Low blood pressure--dizziness, feeling faint or lightheaded, blurry vision Side effects that usually do not require medical attention (report to your care team if they continue or are bothersome): Constipation Dizziness Drowsiness Dry mouth Fatigue This list may not describe all possible side effects. Call your doctor for medical advice about side effects. You may report side effects to FDA at 1-800-FDA-1088. Where should I keep my medication? Keep out of the reach of children and pets. Store at room temperature between 15 and 30 degrees C (59 and 86 degrees F). Get rid of any unused medication after the expiration date. To get rid of medications that are no longer needed or have expired: Take the medication to a medication take-back program. Check with your pharmacy or law enforcement to find a location. If you cannot return the medication, check the label or package insert to see if the medication should be thrown out in the garbage or flushed down the toilet. If you are not sure, ask your care team. If it is safe to put it in the trash, take the medication out of the container. Mix the medication with cat litter, dirt, coffee grounds, or other unwanted substance. Seal the mixture in a bag or container. Put it in the trash. NOTE: This sheet is a summary. It may not cover all possible information. If you have questions about this medicine, talk to your doctor, pharmacist, or health care provider.  2022 Elsevier/Gold Standard (2020-10-22 00:00:00) Prednisolone Tablets What is this medication? PREDNISOLONE (pred NISS oh lone) treats many conditions such as asthma, allergic reactions, arthritis, inflammatory bowel diseases, adrenal, and  blood or bone marrow disorders. It works by decreasing inflammation, slowing down an overactive immune system, or replacing cortisol normally made in the body. Cortisol is a hormone that plays an important role in how the body responds to stress, illness, and injury. It belongs to a group of medications called steroids. This medicine may be used for other purposes; ask your health care provider or pharmacist if you have questions. COMMON BRAND NAME(S): Millipred, Millipred DP, Millipred DP 12-Day, Millipred DP 6 Day, Prednoral What should I tell my care team before I take this medication? They need to know if you have any of these conditions: Cushing's syndrome Diabetes Glaucoma Heart problems or disease High blood pressure Infection such as herpes, measles, tuberculosis, or chickenpox Kidney disease Liver disease Mental problems Myasthenia gravis Osteoporosis Seizures Stomach ulcer or intestine disease including colitis and diverticulitis Thyroid problem An unusual or allergic reaction to lactose, prednisolone, other medications, foods, dyes, or preservatives Pregnant or trying to get pregnant Breast-feeding How should I use this medication? Take this medication by mouth with a glass of water. Follow the directions on the prescription label. Take it with food or milk to avoid stomach upset. If you are taking this medication once a day, take it in the morning. Do not take more medication than you are told to take. Do not suddenly stop taking your medication because you may develop a severe reaction. Your care team will tell you how much medication to take. If your care team wants you to stop the medication, the dose may be slowly lowered over time to avoid any side effects. Talk to your  care team about the use of this medication in children. Special care may be needed. Overdosage: If you think you have taken too much of this medicine contact a poison control center or emergency room at  once. NOTE: This medicine is only for you. Do not share this medicine with others. What if I miss a dose? If you miss a dose, take it as soon as you can. If it is almost time for your next dose, take only that dose. Do not take double or extra doses. What may interact with this medication? Do not take this medication with any of the following: Metyrapone Mifepristone This medication may also interact with the following: Aminoglutethimide Amphotericin B Aspirin and aspirin-like medications Barbiturates Certain medications for diabetes, like glipizide or glyburide Cholestyramine Cholinesterase inhibitors Cyclosporine Digoxin Diuretics Ephedrine Male hormones, like estrogens and birth control pills Isoniazid Ketoconazole NSAIDS, medications for pain and inflammation, like ibuprofen or naproxen Phenytoin Rifampin Toxoids Vaccines Warfarin This list may not describe all possible interactions. Give your health care provider a list of all the medicines, herbs, non-prescription drugs, or dietary supplements you use. Also tell them if you smoke, drink alcohol, or use illegal drugs. Some items may interact with your medicine. What should I watch for while using this medication? Visit your care team for regular checks on your progress. If you are taking this medication over a prolonged period, carry an identification card with your name and address, the type and dose of your medication, and your care team's name and address. This medication may increase your risk of getting an infection. Tell your care team if you are around anyone with measles or chickenpox, or if you develop sores or blisters that do not heal properly. If you are going to have surgery, tell your care team that you have taken this medication within the last twelve months. Ask your care team about your diet. You may need to lower the amount of salt you eat. This medication may increase blood sugar. Ask your care team if  changes in diet or medications are needed if you have diabetes. What side effects may I notice from receiving this medication? Side effects that you should report to your care team as soon as possible: Allergic reactions--skin rash, itching, hives, swelling of the face, lips, tongue, or throat Cushing syndrome--increased fat around the midsection, upper back, neck, or face, pink or purple stretch marks on the skin, thinning, fragile skin that easily bruises, unexpected hair growth High blood sugar (hyperglycemia)--increased thirst or amount of urine, unusual weakness or fatigue, blurry vision Increase in blood pressure Infection--fever, chills, cough, sore throat, wounds that don't heal, pain or trouble when passing urine, general feeling of discomfort or being unwell Low adrenal gland function--nausea, vomiting, loss of appetite, unusual weakness or fatigue, dizziness Mood and behavior changes--anxiety, nervousness, confusion, hallucinations, irritability, hostility, thoughts of suicide or self-harm, worsening mood, feelings of depression Stomach bleeding--bloody or black, tar-like stools, vomiting blood or brown material that looks like coffee grounds Swelling of the ankles, hands, or feet Side effects that usually do not require medical attention (report to your care team if they continue or are bothersome): Acne General discomfort and fatigue Headache Increase in appetite Nausea Trouble sleeping Weight gain This list may not describe all possible side effects. Call your doctor for medical advice about side effects. You may report side effects to FDA at 1-800-FDA-1088. Where should I keep my medication? Keep out of the reach of children. Store at room temperature between  15 and 30 degrees C (59 and 86 degrees F). Keep container tightly closed. Throw away any unused medication after the expiration date. NOTE: This sheet is a summary. It may not cover all possible information. If you have  questions about this medicine, talk to your doctor, pharmacist, or health care provider.  2022 Elsevier/Gold Standard (2020-09-03 00:00:00) Acute Back Pain, Adult Acute back pain is sudden and usually short-lived. It is often caused by an injury to the muscles and tissues in the back. The injury may result from: A muscle, tendon, or ligament getting overstretched or torn. Ligaments are tissues that connect bones to each other. Lifting something improperly can cause a back strain. Wear and tear (degeneration) of the spinal disks. Spinal disks are circular tissue that provide cushioning between the bones of the spine (vertebrae). Twisting motions, such as while playing sports or doing yard work. A hit to the back. Arthritis. You may have a physical exam, lab tests, and imaging tests to find the cause of your pain. Acute back pain usually goes away with rest and home care. Follow these instructions at home: Managing pain, stiffness, and swelling Take over-the-counter and prescription medicines only as told by your health care provider. Treatment may include medicines for pain and inflammation that are taken by mouth or applied to the skin, or muscle relaxants. Your health care provider may recommend applying ice during the first 24-48 hours after your pain starts. To do this: Put ice in a plastic bag. Place a towel between your skin and the bag. Leave the ice on for 20 minutes, 2-3 times a day. Remove the ice if your skin turns bright red. This is very important. If you cannot feel pain, heat, or cold, you have a greater risk of damage to the area. If directed, apply heat to the affected area as often as told by your health care provider. Use the heat source that your health care provider recommends, such as a moist heat pack or a heating pad. Place a towel between your skin and the heat source. Leave the heat on for 20-30 minutes. Remove the heat if your skin turns bright red. This is especially  important if you are unable to feel pain, heat, or cold. You have a greater risk of getting burned. Activity  Do not stay in bed. Staying in bed for more than 1-2 days can delay your recovery. Sit up and stand up straight. Avoid leaning forward when you sit or hunching over when you stand. If you work at a desk, sit close to it so you do not need to lean over. Keep your chin tucked in. Keep your neck drawn back, and keep your elbows bent at a 90-degree angle (right angle). Sit high and close to the steering wheel when you drive. Add lower back (lumbar) support to your car seat, if needed. Take short walks on even surfaces as soon as you are able. Try to increase the length of time you walk each day. Do not sit, drive, or stand in one place for more than 30 minutes at a time. Sitting or standing for long periods of time can put stress on your back. Do not drive or use heavy machinery while taking prescription pain medicine. Use proper lifting techniques. When you bend and lift, use positions that put less stress on your back: Foster your knees. Keep the load close to your body. Avoid twisting. Exercise regularly as told by your health care provider. Exercising helps your back  heal faster and helps prevent back injuries by keeping muscles strong and flexible. Work with a physical therapist to make a safe exercise program, as recommended by your health care provider. Do any exercises as told by your physical therapist. Lifestyle Maintain a healthy weight. Extra weight puts stress on your back and makes it difficult to have good posture. Avoid activities or situations that make you feel anxious or stressed. Stress and anxiety increase muscle tension and can make back pain worse. Learn ways to manage anxiety and stress, such as through exercise. General instructions Sleep on a firm mattress in a comfortable position. Try lying on your side with your knees slightly bent. If you lie on your back, put a  pillow under your knees. Keep your head and neck in a straight line with your spine (neutral position) when using electronic equipment like smartphones or pads. To do this: Raise your smartphone or pad to look at it instead of bending your head or neck to look down. Put the smartphone or pad at the level of your face while looking at the screen. Follow your treatment plan as told by your health care provider. This may include: Cognitive or behavioral therapy. Acupuncture or massage therapy. Meditation or yoga. Contact a health care provider if: You have pain that is not relieved with rest or medicine. You have increasing pain going down into your legs or buttocks. Your pain does not improve after 2 weeks. You have pain at night. You lose weight without trying. You have a fever or chills. You develop nausea or vomiting. You develop abdominal pain. Get help right away if: You develop new bowel or bladder control problems. You have unusual weakness or numbness in your arms or legs. You feel faint. These symptoms may represent a serious problem that is an emergency. Do not wait to see if the symptoms will go away. Get medical help right away. Call your local emergency services (911 in the [place] Do not drive yourself to the hospital. Summary Acute back pain is sudden and usually short-lived. Use proper lifting techniques. When you bend and lift, use positions that put less stress on your back. Take over-the-counter and prescription medicines only as told by your health care provider, and apply heat or ice as told. This information is not intended to replace advice given to you by your health care provider. Make sure you discuss any questions you have with your health care provider. Document Revised: 08/27/2020 Document Reviewed: 08/27/2020 Elsevier Patient Education  [place].

## 2021-05-19 LAB — CBC WITH DIFFERENTIAL/PLATELET
Basophils Absolute: 0.1 10*3/uL (ref 0.0–0.1)
Basophils Relative: 1.2 % (ref 0.0–3.0)
Eosinophils Absolute: 0.2 10*3/uL (ref 0.0–0.7)
Eosinophils Relative: 3.2 % (ref 0.0–5.0)
HCT: 39.9 % (ref 39.0–52.0)
Hemoglobin: 12.3 g/dL — ABNORMAL LOW (ref 13.0–17.0)
Lymphocytes Relative: 25.4 % (ref 12.0–46.0)
Lymphs Abs: 1.3 10*3/uL (ref 0.7–4.0)
MCHC: 30.8 g/dL (ref 30.0–36.0)
MCV: 64.2 fl — ABNORMAL LOW (ref 78.0–100.0)
Monocytes Absolute: 0.4 10*3/uL (ref 0.1–1.0)
Monocytes Relative: 8.4 % (ref 3.0–12.0)
Neutro Abs: 3.2 10*3/uL (ref 1.4–7.7)
Neutrophils Relative %: 61.8 % (ref 43.0–77.0)
Platelets: 161 10*3/uL (ref 150.0–400.0)
RBC: 6.21 Mil/uL — ABNORMAL HIGH (ref 4.22–5.81)
RDW: 16.8 % — ABNORMAL HIGH (ref 11.5–15.5)
WBC: 5.2 10*3/uL (ref 4.0–10.5)

## 2021-05-19 LAB — COMPREHENSIVE METABOLIC PANEL
ALT: 49 U/L (ref 0–53)
AST: 36 U/L (ref 0–37)
Albumin: 4.7 g/dL (ref 3.5–5.2)
Alkaline Phosphatase: 44 U/L (ref 39–117)
BUN: 18 mg/dL (ref 6–23)
CO2: 30 mEq/L (ref 19–32)
Calcium: 9.9 mg/dL (ref 8.4–10.5)
Chloride: 104 mEq/L (ref 96–112)
Creatinine, Ser: 1.18 mg/dL (ref 0.40–1.50)
GFR: 71.58 mL/min (ref >60.00)
Glucose, Bld: 84 mg/dL (ref 70–99)
Potassium: 3.9 mEq/L (ref 3.5–5.1)
Sodium: 140 mEq/L (ref 135–145)
Total Bilirubin: 1.2 mg/dL (ref 0.2–1.2)
Total Protein: 7.9 g/dL (ref 6.0–8.3)

## 2021-05-19 NOTE — Progress Notes (Signed)
CBC is stable from last :  You are slightly anemic or low blood counts and your red blood counts size is small MCV  -at times this could be a genetic condition like thalassemia trait/sickle cell trait  -do you want referral to blood specialist for further work up?  CMP is within normal limits.  How is he feeling today with back pain ?  X ray is still pending.  Recommend he keep regular follow up with PCP for routine health care.

## 2021-05-23 ENCOUNTER — Encounter: Payer: Self-pay | Admitting: Adult Health

## 2021-05-23 ENCOUNTER — Other Ambulatory Visit: Payer: Self-pay

## 2021-05-23 ENCOUNTER — Ambulatory Visit (INDEPENDENT_AMBULATORY_CARE_PROVIDER_SITE_OTHER): Payer: Self-pay | Admitting: Adult Health

## 2021-05-23 VITALS — BP 152/70 | HR 73 | Temp 98.0°F | Resp 16 | Ht 76.0 in | Wt 228.4 lb

## 2021-05-23 DIAGNOSIS — M25552 Pain in left hip: Secondary | ICD-10-CM

## 2021-05-23 DIAGNOSIS — K5900 Constipation, unspecified: Secondary | ICD-10-CM

## 2021-05-23 DIAGNOSIS — M51369 Other intervertebral disc degeneration, lumbar region without mention of lumbar back pain or lower extremity pain: Secondary | ICD-10-CM

## 2021-05-23 DIAGNOSIS — R1084 Generalized abdominal pain: Secondary | ICD-10-CM

## 2021-05-23 DIAGNOSIS — R1032 Left lower quadrant pain: Secondary | ICD-10-CM

## 2021-05-23 DIAGNOSIS — M5136 Other intervertebral disc degeneration, lumbar region: Secondary | ICD-10-CM | POA: Insufficient documentation

## 2021-05-23 NOTE — Progress Notes (Signed)
Mild degenerative change on lumbar spine.  He has been advised of results at office visit.

## 2021-05-23 NOTE — Patient Instructions (Signed)
Spasticity Spasticity is a condition in which your muscles contract suddenly and unpredictably (spasm). Spasticity usually affects your arms, legs, or back. It can also affect the way you walk. Spasticity can range from mild muscle stiffness and tightness to severe, uncontrollable muscle spasms. Severe spasticity can be painful and can freeze your muscles in an uncomfortable position. Follow these instructions at home: Managing muscle stiffness and spasms   Wear a brace as told by your health care provider to prevent muscle contractions. Have the affected muscles massaged. If directed, apply heat to the affected muscle area. Use the heat source that your health care provider recommends, such as a moist heat pack or heating pad. Place a towel between your skin and the heat source. Leave the heat on for 20-30 minutes. Remove the heat if your skin turns bright red. This is especially important if you are unable to feel pain, heat, or cold. You may have a greater risk of getting burned. If directed, apply ice to the affected muscle area: Put ice in a plastic bag. Place a towel between your skin and the bag or between your brace and the bag. Leave the ice on for 20 minutes, 2?3 times a day. Activity Stay active as directed by your health care provider. Find a safe exercise program that fits your needs and ability. Maintain good posture when walking and sitting. Work with a physical therapist to learn exercises that will stretch and strengthen your muscles. Do stretching and range-of-motion exercises at home as told by a physical therapist. Work with an occupational therapist. This type of health care provider can help you function better at home and at work. If you have severe spasticity, use mobility aids, such as a walker or cane, as told by your health care provider. General instructions Watch your condition for any changes. Wear loose, comfortable clothing that does not restrict your  movement. Wear closed-toe shoes that fit well and support your feet. Wear shoes that have rubber soles or low heels. Take over-the-counter and prescription medicine only as told by your health care provider. Keep all follow-up visits as told by your health care provider. This is important. Contact a health care provider if you: Have worsening muscle spasms. Develop other symptoms along with spasticity. Have a fever or chills. Experience a burning feeling when you pass urine. Become constipated. Need more support at home. Get help right away if you: Have trouble breathing. Have a muscle spasm that freezes you into a painful position. Cannot walk. Cannot care for yourself at home. Have trouble passing urine or have urinary incontinence. Summary Spasticity is a condition in which your muscles contract suddenly and unpredictably (spasm). Spasticity usually affects your arms, legs, or back. Spasticity can range from mild muscle stiffness and tightness to severe, uncontrollable muscle spasms. Do stretching and range-of-motion exercises at home as told by a physical therapist. Take over-the-counter and prescription medicine only as told by your health care provider. This information is not intended to replace advice given to you by your health care provider. Make sure you discuss any questions you have with your health care provider. Document Revised: 12/24/2020 Document Reviewed: 12/24/2020 Elsevier Patient Education  [place]. Abdominal Pain, Adult Pain in the abdomen (abdominal pain) can be caused by many things. Often, abdominal pain is not serious and it gets better with no treatment or by being treated at home. However, sometimes abdominal pain is serious. Your health care provider will ask questions about your medical history and  do a physical exam to try to determine the cause of your abdominal pain. Follow these instructions at home: Medicines Take over-the-counter and  prescription medicines only as told by your health care provider. Do not take a laxative unless told by your health care provider. General instructions  Watch your condition for any changes. Drink enough fluid to keep your urine pale yellow. Keep all follow-up visits as told by your health care provider. This is important. Contact a health care provider if: Your abdominal pain changes or gets worse. You are not hungry or you lose weight without trying. You are constipated or have diarrhea for more than 2-3 days. You have pain when you urinate or have a bowel movement. Your abdominal pain wakes you up at night. Your pain gets worse with meals, after eating, or with certain foods. You are vomiting and cannot keep anything down. You have a fever. You have blood in your urine. Get help right away if: Your pain does not go away as soon as your health care provider told you to expect. You cannot stop vomiting. Your pain is only in areas of the abdomen, such as the right side or the left lower portion of the abdomen. Pain on the right side could be caused by appendicitis. You have bloody or black stools, or stools that look like tar. You have severe pain, cramping, or bloating in your abdomen. You have signs of dehydration, such as: Dark urine, very little urine, or no urine. Cracked lips. Dry mouth. Sunken eyes. Sleepiness. Weakness. You have trouble breathing or chest pain. Summary Often, abdominal pain is not serious and it gets better with no treatment or by being treated at home. However, sometimes abdominal pain is serious. Watch your condition for any changes. Take over-the-counter and prescription medicines only as told by your health care provider. Contact a health care provider if your abdominal pain changes or gets worse. Get help right away if you have severe pain, cramping, or bloating in your abdomen. This information is not intended to replace advice given to you by your  health care provider. Make sure you discuss any questions you have with your health care provider. Document Revised: 07/25/2019 Document Reviewed: 10/14/2018 Elsevier Patient Education  [place].

## 2021-05-23 NOTE — Progress Notes (Signed)
Acute Office Visit  Subjective:    Patient ID: Douglas Harper, male    DOB: 10/08/1970, 50 y.o.   MRN: [id number]  Chief Complaint  Patient presents with   Follow-up   Knee Pain   Back Pain    Left of lower back towards spine radiates to knee    HPI Patient is in today for follow up, he had been seen on 05/18/21 on the right lower back. Today he has pain in left lower back. He reports this right lower back pain resolved with muscle relaxer and also with prednisone dose dose pack. KUB showed mild fecal retention.  Lumbar mild degeneration.   Denies any urinary symptoms.   Denies any constipations. Denies any blood or mucous in stool. He is constipated at times and straining.  No pain in right side now. Left side now lower back pain is present now 6/10.   No pain radiating, he does have old hamstring injury right side.   Patient  denies any fever, body aches,chills, rash, chest pain, shortness of breath, nausea, vomiting, or diarrhea.    Past Medical History:  Diagnosis Date   COVID-19    03/17/20   FH: heart disease    GERD (gastroesophageal reflux disease)    Glaucoma    Hyperthyroidism    s/p RAI 2009    Hypothyroidism    Verruca vulgaris     Past Surgical History:  Procedure Laterality Date   ANTERIOR CRUCIATE LIGAMENT REPAIR Right 2000   HAND SURGERY Right 2001   with hardware implanted   NO PAST SURGERIES      Family History  Problem Relation Age of Onset   Heart disease Mother    Hypertension Mother    Diabetes Mother    Colon cancer Neg Hx    Esophageal cancer Neg Hx    Pancreatic cancer Neg Hx    Liver disease Neg Hx    Rectal cancer Neg Hx    Stomach cancer Neg Hx     Social History   Socioeconomic History   Marital status: Single    Spouse name: Not on file   Number of children: Not on file   Years of education: Not on file   Highest education level: Not on file  Occupational History   Not on file  Tobacco Use   Smoking status: Never    Smokeless tobacco: Never  Substance and Sexual Activity   Alcohol use: Yes    Comment: socially   Drug use: Never   Sexual activity: Not on file  Other Topics Concern   Not on file  Social History Narrative   Married wife Haedyn Ancrum DPR    From [place] been here since 50 y.o    Works Designer, multimedia [employer]    No tobacco, drugs, occas. etoh    8 siblings   Social Determinants of Radio broadcast assistant Strain: Not on file  Food Insecurity: Not on file  Transportation Needs: Not on file  Physical Activity: Not on file  Stress: Not on file  Social Connections: Not on file  Intimate Partner Violence: Not on file    Outpatient Medications Prior to Visit  Medication Sig Dispense Refill   Cetirizine HCl (ZYRTEC PO) Take by mouth.     levothyroxine (SYNTHROID) 125 MCG tablet TAKE 1 TABLET BY MOUTH 30 MINUTES BEFORE BREAKFAST (STOP 112MCG DOSE) 90 tablet 3   predniSONE (STERAPRED UNI-PAK 21 TAB) 10 MG (21) TBPK tablet PO:  Take 6 tablets on day 1:Take 5 tablets day 2:Take 4 tablets day 3: Take 3 tablets day 4:Take 2 tablets day five: 5 Take 1 tablet day 6 21 tablet 0   tiZANidine (ZANAFLEX) 4 MG capsule Take 1 capsule (4 mg total) by mouth 3 (three) times daily as needed for muscle spasms. 21 capsule 0   No facility-administered medications prior to visit.    No Known Allergies  Review of Systems  Respiratory: Negative.    Cardiovascular: Negative.   Gastrointestinal:  Positive for abdominal pain and constipation.  Genitourinary: Negative.   Musculoskeletal:  Positive for arthralgias.  Neurological: Negative.   Hematological: Negative.   Psychiatric/Behavioral: Negative.        Objective:    Physical Exam Vitals reviewed.  Constitutional:      General: He is not in acute distress.    Appearance: Normal appearance. He is well-developed. He is not ill-appearing, toxic-appearing or diaphoretic.  HENT:     Head: Normocephalic and atraumatic.     Right Ear:  Hearing, tympanic membrane, ear canal and external ear normal.     Left Ear: Hearing, tympanic membrane, ear canal and external ear normal.     Nose: Nose normal.     Mouth/Throat:     Pharynx: Uvula midline. No oropharyngeal exudate.  Eyes:     General: Lids are normal. No scleral icterus.       Right eye: No discharge.        Left eye: No discharge.     Conjunctiva/sclera: Conjunctivae normal.     Pupils: Pupils are equal, round, and reactive to light.  Neck:     Thyroid: No thyromegaly.     Vascular: Normal carotid pulses. No carotid bruit, hepatojugular reflux or JVD.     Trachea: Trachea and phonation normal. No tracheal tenderness or tracheal deviation.     Meningeal: Brudzinski's sign absent.  Cardiovascular:     Rate and Rhythm: Normal rate and regular rhythm.     Pulses: Normal pulses.     Heart sounds: Normal heart sounds, S1 normal and S2 normal. Heart sounds not distant. No murmur heard.   No friction rub. No gallop.  Pulmonary:     Effort: Pulmonary effort is normal. No accessory muscle usage or respiratory distress.     Breath sounds: Normal breath sounds. No stridor. No wheezing, rhonchi or rales.  Chest:     Chest wall: No tenderness.  Abdominal:     General: Bowel sounds are normal. There is no distension.     Palpations: Abdomen is soft. There is no mass.     Tenderness: There is abdominal tenderness in the suprapubic area and left lower quadrant. There is no right CVA tenderness, left CVA tenderness, guarding or rebound.     Hernia: No hernia is present.     Comments: With deep palpation   Musculoskeletal:        General: No deformity. Normal range of motion.     Cervical back: Normal, full passive range of motion without pain, normal range of motion and neck supple.     Thoracic back: Normal.     Lumbar back: Spasms and tenderness present. Negative right straight leg raise test and negative left straight leg raise test.       Back:  Lymphadenopathy:      Head:     Right side of head: No submental, submandibular, tonsillar, preauricular, posterior auricular or occipital adenopathy.     Left side of head:  No submental, submandibular, tonsillar, preauricular, posterior auricular or occipital adenopathy.     Cervical: No cervical adenopathy.  Skin:    General: Skin is warm and dry.     Coloration: Skin is not pale.     Findings: No erythema or rash.     Nails: There is no clubbing.  Neurological:     Mental Status: He is alert and oriented to person, place, and time.     GCS: GCS eye subscore is 4. GCS verbal subscore is 5. GCS motor subscore is 6.     Cranial Nerves: No cranial nerve deficit.     Sensory: No sensory deficit.     Motor: No abnormal muscle tone.     Coordination: Coordination normal.     Deep Tendon Reflexes: Reflexes are normal and symmetric. Reflexes normal.  Psychiatric:        Speech: Speech normal.        Behavior: Behavior normal.        Thought Content: Thought content normal.        Judgment: Judgment normal.    BP (!) 152/70   Pulse 73   Temp 98 F (36.7 C) (Oral)   Resp 16   Ht 6\' 4"  (1.93 m)   Wt 228 lb 6 oz (103.6 kg)   SpO2 99%   BMI 27.80 kg/m  Wt Readings from Last 3 Encounters:  05/23/21 228 lb 6 oz (103.6 kg)  05/18/21 227 lb 6.4 oz (103.1 kg)  10/10/19 220 lb 12.8 oz (100.2 kg)    Health Maintenance Due  Topic Date Due   HIV Screening  Never done   Hepatitis C Screening  Never done   COVID-19 Vaccine (3 - Booster for Moderna series) 11/29/2019   Zoster Vaccines- Shingrix (1 of 2) Never done    There are no preventive care reminders to display for this patient.   Lab Results  Component Value Date   TSH 2.76 09/24/2019   Lab Results  Component Value Date   WBC 5.2 05/18/2021   HGB 12.3 (L) 05/18/2021   HCT 39.9 05/18/2021   MCV 64.2 Repeated and verified X2. (L) 05/18/2021   PLT 161.0 05/18/2021   Lab Results  Component Value Date   NA 140 05/18/2021   K 3.9 05/18/2021    CO2 30 05/18/2021   GLUCOSE 84 05/18/2021   BUN 18 05/18/2021   CREATININE 1.18 05/18/2021   BILITOT 1.2 05/18/2021   ALKPHOS 44 05/18/2021   AST 36 05/18/2021   ALT 49 05/18/2021   PROT 7.9 05/18/2021   ALBUMIN 4.7 05/18/2021   CALCIUM 9.9 05/18/2021   GFR 71.58 05/18/2021   Lab Results  Component Value Date   CHOL 172 09/24/2019   Lab Results  Component Value Date   HDL 51.20 09/24/2019   Lab Results  Component Value Date   LDLCALC 107 (H) 09/24/2019   Lab Results  Component Value Date   TRIG 68.0 09/24/2019   Lab Results  Component Value Date   CHOLHDL 3 09/24/2019   Lab Results  Component Value Date   HGBA1C 5.6 09/24/2019       Assessment & Plan:   Problem List Items Addressed This Visit       Musculoskeletal and Integument   Degenerative disc disease, lumbar     Other   Left hip pain   Relevant Orders   DG HIPS BILAT WITH PELVIS 3-4 VIEWS   LLQ pain   Relevant Orders   CT  Abdomen Pelvis W Contrast   Constipation   Relevant Orders   CT Abdomen Pelvis W Contrast   Generalized abdominal pain - Primary   Relevant Orders   CT Abdomen Pelvis W Contrast   Urine Microscopic Only   CULTURE, URINE COMPREHENSIVE    Has declined hematology referral for blood dyscrasias in CBC. Has left hip pain/ lower back pain now, muscle relaxer and steroid relieved right sided back pain.  Constipation, increase fiber and bowel regimen given.  CT rule out diverticulitis.  Orthopedics for mild degenerative disc / back pain he declined.   Advised patient call the office or your primary care doctor for an appointment if no improvement within 72 hours or if any symptoms change or worsen at any time  Advised ER or urgent Care if after hours or on weekend. Call 911 for emergency symptoms at any time.Patinet verbalized understanding of all instructions given/reviewed and treatment plan and has no further questions or concerns at this time.    Return if symptoms worsen or  fail to improve, for at any time for any worsening symptoms, Go to Emergency room/ urgent care if worse.    No orders of the defined types were placed in this encounter.    Marcille Buffy, FNP

## 2021-05-23 NOTE — Progress Notes (Signed)
Retained fecal material noted, constipation. Discussed in office and bowel regimen for clean out as well as extra fiber and liquids advised. Can start fiber supplement.

## 2021-05-24 ENCOUNTER — Telehealth: Payer: Self-pay | Admitting: Internal Medicine

## 2021-05-24 ENCOUNTER — Other Ambulatory Visit: Payer: Self-pay | Admitting: Internal Medicine

## 2021-05-24 DIAGNOSIS — Z1389 Encounter for screening for other disorder: Secondary | ICD-10-CM

## 2021-05-24 DIAGNOSIS — Z13220 Encounter for screening for lipoid disorders: Secondary | ICD-10-CM

## 2021-05-24 DIAGNOSIS — E559 Vitamin D deficiency, unspecified: Secondary | ICD-10-CM

## 2021-05-24 DIAGNOSIS — Z125 Encounter for screening for malignant neoplasm of prostate: Secondary | ICD-10-CM

## 2021-05-24 DIAGNOSIS — E039 Hypothyroidism, unspecified: Secondary | ICD-10-CM

## 2021-05-24 NOTE — Telephone Encounter (Signed)
Lft pt vm to call ofc . thanks 

## 2021-05-24 NOTE — Telephone Encounter (Signed)
Lft pt vm to call ofc regarding price of the scan. thanks

## 2021-05-24 NOTE — Addendum Note (Signed)
Addended by: Leeanne Rio on: 05/24/2021 04:43 PM   Modules accepted: Orders

## 2021-05-24 NOTE — Telephone Encounter (Signed)
Patient has a appt 05/25/2021, there are no orders in.

## 2021-05-24 NOTE — Telephone Encounter (Signed)
Pt wants to know if the price is for the imaging scheduled for tomorrow. Pt states he is at work so if he doesn't answer the phone please leave the info on his VM

## 2021-05-25 ENCOUNTER — Other Ambulatory Visit: Payer: Self-pay

## 2021-05-25 ENCOUNTER — Ambulatory Visit (INDEPENDENT_AMBULATORY_CARE_PROVIDER_SITE_OTHER): Payer: Self-pay

## 2021-05-25 DIAGNOSIS — Z1389 Encounter for screening for other disorder: Secondary | ICD-10-CM

## 2021-05-25 DIAGNOSIS — M25552 Pain in left hip: Secondary | ICD-10-CM

## 2021-05-25 DIAGNOSIS — M549 Dorsalgia, unspecified: Secondary | ICD-10-CM | POA: Diagnosis not present

## 2021-05-26 ENCOUNTER — Telehealth: Payer: Self-pay | Admitting: Internal Medicine

## 2021-05-26 ENCOUNTER — Other Ambulatory Visit: Payer: Self-pay | Admitting: Adult Health

## 2021-05-26 LAB — URINALYSIS, ROUTINE W REFLEX MICROSCOPIC
Bacteria, UA: NONE SEEN /HPF
Bilirubin Urine: NEGATIVE
Glucose, UA: NEGATIVE
Hgb urine dipstick: NEGATIVE
Hyaline Cast: NONE SEEN /LPF
Ketones, ur: NEGATIVE
Leukocytes,Ua: NEGATIVE
Nitrite: NEGATIVE
RBC / HPF: NONE SEEN /HPF (ref 0–2)
Specific Gravity, Urine: 1.022 (ref 1.001–1.035)
Squamous Epithelial / HPF: NONE SEEN /HPF (ref <=5)
WBC, UA: NONE SEEN /HPF (ref 0–5)
pH: 6 (ref 5.0–8.0)

## 2021-05-26 LAB — MICROSCOPIC MESSAGE

## 2021-05-26 MED ORDER — TIZANIDINE HCL 4 MG PO CAPS
4.0000 mg | ORAL_CAPSULE | Freq: Three times a day (TID) | ORAL | 0 refills | Status: DC | PRN
Start: 2021-05-26 — End: 2022-07-19

## 2021-05-26 NOTE — Progress Notes (Signed)
Negative hip x ray bilateral.

## 2021-05-26 NOTE — Telephone Encounter (Signed)
I lft a vm for pt to call ofc with the amt for pricing at [place] 740.00. thanks

## 2021-05-30 ENCOUNTER — Telehealth: Payer: Self-pay | Admitting: Internal Medicine

## 2021-05-30 NOTE — Telephone Encounter (Signed)
I lft a vm for pt to call ofc with the amt for pricing at [place] 740.00        Thank you

## 2021-06-22 ENCOUNTER — Encounter: Payer: Self-pay | Admitting: Internal Medicine

## 2021-06-23 NOTE — Telephone Encounter (Signed)
Please advise 

## 2021-07-01 ENCOUNTER — Other Ambulatory Visit: Payer: Self-pay

## 2021-07-01 ENCOUNTER — Encounter: Payer: Self-pay | Admitting: Internal Medicine

## 2021-07-01 ENCOUNTER — Ambulatory Visit (INDEPENDENT_AMBULATORY_CARE_PROVIDER_SITE_OTHER): Payer: 59 | Admitting: Internal Medicine

## 2021-07-01 VITALS — BP 130/86 | HR 73 | Temp 97.0°F | Ht 76.0 in | Wt 229.4 lb

## 2021-07-01 DIAGNOSIS — E039 Hypothyroidism, unspecified: Secondary | ICD-10-CM

## 2021-07-01 DIAGNOSIS — M5432 Sciatica, left side: Secondary | ICD-10-CM

## 2021-07-01 DIAGNOSIS — Z0000 Encounter for general adult medical examination without abnormal findings: Secondary | ICD-10-CM | POA: Diagnosis not present

## 2021-07-01 DIAGNOSIS — L731 Pseudofolliculitis barbae: Secondary | ICD-10-CM

## 2021-07-01 DIAGNOSIS — Z13220 Encounter for screening for lipoid disorders: Secondary | ICD-10-CM

## 2021-07-01 DIAGNOSIS — L819 Disorder of pigmentation, unspecified: Secondary | ICD-10-CM

## 2021-07-01 DIAGNOSIS — Z13818 Encounter for screening for other digestive system disorders: Secondary | ICD-10-CM

## 2021-07-01 DIAGNOSIS — E559 Vitamin D deficiency, unspecified: Secondary | ICD-10-CM

## 2021-07-01 DIAGNOSIS — E890 Postprocedural hypothyroidism: Secondary | ICD-10-CM

## 2021-07-01 DIAGNOSIS — Z125 Encounter for screening for malignant neoplasm of prostate: Secondary | ICD-10-CM

## 2021-07-01 DIAGNOSIS — S76309A Unspecified injury of muscle, fascia and tendon of the posterior muscle group at thigh level, unspecified thigh, initial encounter: Secondary | ICD-10-CM

## 2021-07-01 DIAGNOSIS — L729 Follicular cyst of the skin and subcutaneous tissue, unspecified: Secondary | ICD-10-CM

## 2021-07-01 DIAGNOSIS — M5431 Sciatica, right side: Secondary | ICD-10-CM

## 2021-07-01 DIAGNOSIS — M25562 Pain in left knee: Secondary | ICD-10-CM

## 2021-07-01 DIAGNOSIS — Z113 Encounter for screening for infections with a predominantly sexual mode of transmission: Secondary | ICD-10-CM

## 2021-07-01 LAB — LIPID PANEL
Cholesterol: 250 mg/dL — ABNORMAL HIGH (ref 0–200)
HDL: 57.4 mg/dL (ref >39.00)
LDL Cholesterol: 172 mg/dL — ABNORMAL HIGH (ref 0–99)
NonHDL: 192.58
Total CHOL/HDL Ratio: 4
Triglycerides: 102 mg/dL (ref 0.0–149.0)
VLDL: 20.4 mg/dL (ref 0.0–40.0)

## 2021-07-01 LAB — VITAMIN D 25 HYDROXY (VIT D DEFICIENCY, FRACTURES): VITD: 27.25 ng/mL — ABNORMAL LOW (ref 30.00–100.00)

## 2021-07-01 LAB — TSH: TSH: 70.12 u[IU]/mL — ABNORMAL HIGH (ref 0.35–5.50)

## 2021-07-01 LAB — PSA: PSA: 0.5 ng/mL (ref 0.10–4.00)

## 2021-07-01 MED ORDER — LEVOTHYROXINE SODIUM 125 MCG PO TABS: ORAL_TABLET | ORAL | 3 refills | Status: DC

## 2021-07-01 NOTE — Patient Instructions (Addendum)
Tend skin for hair bumps over the counter [place], [place] 5.0 2 Google reviews Dermatologist in [place], [place] Get online care: Learn more Address: [address], [place], [place] [address] Hours:  Open ? Closes 3?PM Products and Services: [email or web address] Phone: [phone] Check insurance info   Let me know if you want referral [facility] for injections   Dr. Rhina Brackett [facility] in [place], [place] COVID-19 info: [email or web address] Get online care: [email or web address] Address: [address] [place],  [address] Hours:  Open ? Closes 5?PM Phone: [phone]   Hamstring Strain A hamstring strain happens when the muscles in the back of the thighs (hamstring muscles) are overstretched or torn. The hamstring muscles are used in straightening the hips, bending the knees, and pulling back the legs. This injury is often called a pulled hamstring muscle. The tissue that connects the muscle to a bone (tendon) may also be affected. The severity of a hamstring strain may be rated in degrees or grades. First-degree (or grade 1) strains have the least amount of muscle tearing and pain. Second-degree and third-degree (grade 2 and 3) strains have increasingly more tearing and pain. What are the causes? This condition is caused by a sudden, violent force being placed on the hamstring muscles, stretching them too far. This often happens during activities that involve sprinting, jumping, kicking, or weight lifting. What increases the risk? Hamstring strains are especially common in athletes. The following factors may also make you more likely to develop this condition: Having low strength, endurance, or flexibility of the hamstring muscles. Doing high-impact physical activity or sports. Having poor physical fitness. Having a previous leg injury. Having tired (fatigued)  muscles. Having a previous hamstring strain. What are the signs or symptoms? Symptoms of this condition include: Pain in the back of the thigh or buttocks. Swelling. Bruising. Muscle spasms. Trouble moving the affected muscle because of pain. For severe strains, you may feel popping or snapping in the back of your thigh when the injury occurs. How is this diagnosed? This condition is diagnosed based on your symptoms, your medical history, and a physical exam. You may also have imaging tests, such as an MRI or X-rays. Your strain may be rated based on how severe it is. The ratings are: Grade 1 strain (mild). Muscles are overstretched. There may be very small muscle tears. Grade 2 strain (moderate). Muscles are partially torn. Grade 3 strain (severe). Muscles are completely torn. How is this treated? Treatment for this condition usually involves: Protecting, resting, icing, applying compression, and elevating the injured area (PRICE therapy). Medicines. Your health care provider may recommend medicines to help reduce pain or inflammation. Doing exercises to regain strength and flexibility in the muscles. Your health care provider will tell you when it is okay to begin exercising. Hamstring strains may take a long time to heal. This type of strain may happen again in athletes. Follow your health care provider's advice about when to return to sports-related activities. Follow these instructions at home: PRICE therapy Use PRICE therapy to promote muscle healing during the first 2-3 days after your injury, or as told by your health care provider. Protect the muscle from being injured again. Rest your injury. This usually involves limiting your normal activities and not using the injured hamstring muscle. Talk with your health care provider about how you should limit your activities. Apply ice to the injured area: Put ice in  a plastic bag. Place a towel between your skin and the bag. Leave  the ice on for 20 minutes, 2-3 times a day. After the third day, switch to applying heat as told. Put pressure (compression) on your injured hamstring by wrapping it with an elastic bandage. Be careful not to wrap it too tightly. That may interfere with blood circulation or may increase swelling. Raise (elevate) your injured hamstring above the level of your heart as often as possible. When you are lying down, you can do this by putting a pillow under your thigh.  Activity Begin exercising or stretching only as told by your health care provider. Do not return to full activity level until your health care provider approves. To help prevent muscle strains in the future, always warm up before exercising and stretch afterward. General instructions Take over-the-counter and prescription medicines only as told by your health care provider. If directed, apply heat to the affected area as often as told by your health care provider. Use the heat source that your health care provider recommends, such as a moist heat pack or a heating pad. Place a towel between your skin and the heat source. Leave the heat on for 20-30 minutes. Remove the heat if your skin turns bright red. This is especially important if you are unable to feel pain, heat, or cold. You may have a greater risk of getting burned. Keep all follow-up visits. This is important. Contact a health care provider if: You have increasing pain or swelling in the injured area. You have numbness, tingling, or a significant loss of strength in the injured area. Get help right away if: Your foot or your toes become cold or turn blue. Summary A hamstring strain happens when the muscles in the back of the thighs (hamstring muscles) are overstretched or torn. This injury can be caused by a sudden, violent force being placed on the hamstring muscles, causing them to stretch too far. Symptoms include pain, swelling, and muscle spasms in the injured  area. Treatment includes PRICE therapy: protecting, resting, icing, applying compression, and elevating the injured area. This information is not intended to replace advice given to you by your health care provider. Make sure you discuss any questions you have with your health care provider. Document Revised: 11/04/2020 Document Reviewed: 11/04/2020 Elsevier Patient Education  [place] Strain [facility] Ask your health care provider which exercises are safe for you. Do exercises exactly as told by your health care provider and adjust them as directed. It is normal to feel mild stretching, pulling, tightness, or discomfort as you do these exercises. Stop right away if you feel sudden pain or your pain gets worse. Do not begin these exercises until told by your health care provider. Stretching and range-of-motion exercises These exercises warm up your muscles and joints and improve the movement and flexibility of your thighs. These exercises also help to relieve pain, numbness, and tingling. Talk to your health care provider about these restrictions. Knee extension, seated  Sit with your left / right heel propped on a chair, a coffee table, or a footstool. Do not have anything under your knee to support it. Allow your leg muscles to relax, letting gravity straighten out your knee (extension). You should feel a stretch behind your left / right knee. If told by your health care provider, deepen the stretch by placing a __________ weight on your thigh, just above your kneecap. Hold this position for __________ seconds. Repeat __________  times. Complete this exercise __________ times a day. Seated stretch This exercise is sometimes called hamstrings and adductors stretch. Sit on the floor with your legs stretched wide. Keep your knees straight during this exercise. Keeping your head and back in a straight line, bend at your waist to reach for your left foot. You should feel a stretch  in your right inner thigh (adductors). Hold this position for __________ seconds. Then slowly return to the upright position. Keeping your head and back in a straight line, bend at your waist to reach forward. You should feel a stretch behind both of your thighs or knees (hamstrings). Hold this position for __________ seconds. Then slowly return to the upright position. Keeping your head and back in a straight line, bend at your waist to reach for your right foot. You should feel a stretch in your left inner thigh (adductors). Hold this position for __________ seconds. Then slowly return to the upright position. Repeat __________ times. Complete this exercise __________ times a day. Hamstrings stretch, supine  Lie on your back (supine position). Loop a belt or towel over the ball of your left / right foot. The ball of your foot is on the walking surface, right under your toes. Straighten your left / right knee and slowly pull on the belt or towel to raise your leg. Do not let your left / right knee bend while you do this. Keep your other leg flat on the floor. Raise the left / right leg until you feel a gentle stretch behind your left / right knee or thigh (hamstrings). Hold this position for __________ seconds. Slowly return your leg to the starting position. Repeat __________ times. Complete this exercise __________ times a day. Strengthening exercises These exercises build strength and endurance in your thighs. Endurance is the ability to use your muscles for a long time, even after they get tired. Straight leg raises, prone This exercise strengthens the muscles that move the hips (hip extensors). Lie on your abdomen on a firm surface (prone position). Tense the muscles in your buttocks and lift your left / right leg about 4 inches (10 cm). Keep your knee straight as you lift your leg. If you cannot lift your leg that high without arching your back, place a pillow under your hips. Hold the  position for __________ seconds. Slowly lower your leg to the starting position. Allow your muscles to relax completely before you start the next repetition. Repeat __________ times. Complete this exercise __________ times a day. Bridge This exercise strengthens the muscles in your buttocks and the back of your thighs (hip extensors). Lie on your back on a firm surface with your knees bent and your feet flat on the floor. Tighten your buttocks muscles and lift your bottom off the floor until the trunk of your body is level with your thighs. You should feel the muscles working in your buttocks and the back of your thighs. Do not arch your back. Hold this position for __________ seconds. Slowly lower your hips to the starting position. Let your buttocks muscles relax completely between repetitions. If told by your health care provider, keep your bottom lifted off the floor while you slowly walk your feet away from you as far as you can control. Hold for __________ seconds, then slowly walk your feet back toward you. Repeat __________ times. Complete this exercise __________ times a day. Lateral walking with band This is an exercise in which you walk sideways (lateral), with tension provided by  an exercise band. The exercise strengthens the muscles in your hip (hip abductors). Stand in a long hallway. Wrap a loop of exercise band around your legs, just above your knees. Bend your knees gently and drop your hips down and back so your weight is over your heels. Step to the side to move down the length of the hallway, keeping your toes pointed ahead of you and keeping tension in the band. Repeat, leading with your other leg. Repeat __________ times. Complete this exercise __________ times a day. Single leg stand with reaching This exercise is also called eccentric hamstring stretch. Stand on your left / right foot. Keep your big toe down on the floor and try to keep your arch lifted. Slowly reach  down toward the floor as far as you can while keeping your balance. Lowering your thigh under tension is called eccentric stretching. Hold this position for __________ seconds. Repeat __________ times. Complete this exercise __________ times a day. Plank, prone This exercise strengthens muscles in your abdomen and core area. Lie on your abdomen on the floor (prone position),and prop yourself up on your elbows. Your hands should be straight out in front of you, and your elbows should be below your shoulders. Position your feet similar to a push-up position so your toes are on the ground. Tighten your abdominal muscles and lift your body off the floor. Do not arch your back. Do not hold your breath. Hold this position for __________ seconds. Repeat __________ times. Complete this exercise __________ times a day. This information is not intended to replace advice given to you by your health care provider. Make sure you discuss any questions you have with your health care provider. Document Revised: 11/29/2020 Document Reviewed: 11/29/2020 Elsevier Patient Education  2022 [employer].  Sciatica Sciatica is pain, numbness, weakness, or tingling along the path of the sciatic nerve. The sciatic nerve starts in the lower back and runs down the back of each leg. The nerve controls the muscles in the lower leg and in the back of the knee. It also provides feeling (sensation) to the back of the thigh, the lower leg, and the sole of the foot. Sciatica is a symptom of another medical condition that pinches or puts pressure on the sciatic nerve. Sciatica most often only affects one side of the body. Sciatica usually goes away on its own or with treatment. In some cases, sciatica may come back (recur). What are the causes? This condition is caused by pressure on the sciatic nerve or pinching of the nerve. This may be the result of: A disk in between the bones of the spine bulging out too far (herniated  disk). Age-related changes in the spinal disks. A pain disorder that affects a muscle in the buttock. Extra bone growth near the sciatic nerve. A break (fracture) of the pelvis. Pregnancy. Tumor. This is rare. What increases the risk? The following factors may make you more likely to develop this condition: Playing sports that place pressure or stress on the spine. Having poor strength and flexibility. A history of back injury or surgery. Sitting for long periods of time. Doing activities that involve repetitive bending or lifting. Obesity. What are the signs or symptoms? Symptoms can vary from mild to very severe, and they may include: Any of these problems in the lower back, leg, hip, or buttock: Mild tingling, numbness, or dull aches. Burning sensations. Sharp pains. Numbness in the back of the calf or the sole of the foot. Leg  weakness. Severe back pain that makes movement difficult. Symptoms may get worse when you cough, sneeze, or laugh, or when you sit or stand for long periods of time. How is this diagnosed? This condition may be diagnosed based on: Your symptoms and medical history. A physical exam. Blood tests. Imaging tests, such as: X-rays. MRI. CT scan. How is this treated? In many cases, this condition improves on its own without treatment. However, treatment may include: Reducing or modifying physical activity. Exercising and stretching. Icing and applying heat to the affected area. Medicines that help to: Relieve pain and swelling. Relax your muscles. Injections of medicines that help to relieve pain, irritation, and inflammation around the sciatic nerve (steroids). Surgery. Follow these instructions at home: Medicines Take over-the-counter and prescription medicines only as told by your health care provider. Ask your health care provider if the medicine prescribed to you: Requires you to avoid driving or using heavy machinery. Can cause constipation.  You may need to take these actions to prevent or treat constipation: Drink enough fluid to keep your urine pale yellow. Take over-the-counter or prescription medicines. Eat foods that are high in fiber, such as beans, whole grains, and fresh fruits and vegetables. Limit foods that are high in fat and processed sugars, such as fried or sweet foods. Managing pain   If directed, put ice on the affected area. Put ice in a plastic bag. Place a towel between your skin and the bag. Leave the ice on for 20 minutes, 2-3 times a day. If directed, apply heat to the affected area. Use the heat source that your health care provider recommends, such as a moist heat pack or a heating pad. Place a towel between your skin and the heat source. Leave the heat on for 20-30 minutes. Remove the heat if your skin turns bright red. This is especially important if you are unable to feel pain, heat, or cold. You may have a greater risk of getting burned. Activity  Return to your normal activities as told by your health care provider. Ask your health care provider what activities are safe for you. Avoid activities that make your symptoms worse. Take brief periods of rest throughout the day. When you rest for longer periods, mix in some mild activity or stretching between periods of rest. This will help to prevent stiffness and pain. Avoid sitting for long periods of time without moving. Get up and move around at least one time each hour. Exercise and stretch regularly, as told by your health care provider. Do not lift anything that is heavier than 10 lb (4.5 kg) while you have symptoms of sciatica. When you do not have symptoms, you should still avoid heavy lifting, especially repetitive heavy lifting. When you lift objects, always use proper lifting technique, which includes: Bending your knees. Keeping the load close to your body. Avoiding twisting. General instructions Maintain a healthy weight. Excess weight  puts extra stress on your back. Wear supportive, comfortable shoes. Avoid wearing high heels. Avoid sleeping on a mattress that is too soft or too hard. A mattress that is firm enough to support your back when you sleep may help to reduce your pain. Keep all follow-up visits as told by your health care provider. This is important. Contact a health care provider if: You have pain that: Wakes you up when you are sleeping. Gets worse when you lie down. Is worse than you have experienced in the past. Lasts longer than 4 weeks. You have an unexplained  weight loss. Get help right away if: You are not able to control when you urinate or have bowel movements (incontinence). You have: Weakness in your lower back, pelvis, buttocks, or legs that gets worse. Redness or swelling of your back. A burning sensation when you urinate. Summary Sciatica is pain, numbness, weakness, or tingling along the path of the sciatic nerve. This condition is caused by pressure on the sciatic nerve or pinching of the nerve. Sciatica can cause pain, numbness, or tingling in the lower back, legs, hips, and buttocks. Treatment often includes rest, exercise, medicines, and applying ice or heat. This information is not intended to replace advice given to you by your health care provider. Make sure you discuss any questions you have with your health care provider. Document Revised: 06/24/2018 Document Reviewed: 06/24/2018 Elsevier Patient Education  2022 [place].  Sciatica Rehab Ask your health care provider which exercises are safe for you. Do exercises exactly as told by your health care provider and adjust them as directed. It is normal to feel mild stretching, pulling, tightness, or discomfort as you do these exercises. Stop right away if you feel sudden pain or your pain gets worse. Do not begin these exercises until told by your health care provider. Stretching and range-of-motion exercises These exercises warm  up your muscles and joints and improve the movement and flexibility of your hips and back. These exercises also help to relieve pain, numbness, and tingling. Sciatic nerve glide Sit in a chair with your head facing down toward your chest. Place your hands behind your back. Let your shoulders slump forward. Slowly straighten one of your legs while you tilt your head back as if you are looking toward the ceiling. Only straighten your leg as far as you can without making your symptoms worse. Hold this position for __________ seconds. Slowly return to the starting position. Repeat with your other leg. Repeat __________ times. Complete this exercise __________ times a day. Knee to chest with hip adduction and internal rotation  Lie on your back on a firm surface with both legs straight. Bend one of your knees and move it up toward your chest until you feel a gentle stretch in your lower back and buttock. Then, move your knee toward the shoulder that is on the opposite side from your leg. This is hip adduction and internal rotation. Hold your leg in this position by holding on to the front of your knee. Hold this position for __________ seconds. Slowly return to the starting position. Repeat with your other leg. Repeat __________ times. Complete this exercise __________ times a day. Prone extension on elbows  Lie on your abdomen on a firm surface. A bed may be too soft for this exercise. Prop yourself up on your elbows. Use your arms to help lift your chest up until you feel a gentle stretch in your abdomen and your lower back. This will place some of your body weight on your elbows. If this is uncomfortable, try stacking pillows under your chest. Your hips should stay down, against the surface that you are lying on. Keep your hip and back muscles relaxed. Hold this position for __________ seconds. Slowly relax your upper body and return to the starting position. Repeat __________ times. Complete  this exercise __________ times a day. Strengthening exercises These exercises build strength and endurance in your back. Endurance is the ability to use your muscles for a long time, even after they get tired. Pelvic tilt This exercise strengthens the  muscles that lie deep in the abdomen. Lie on your back on a firm surface. Bend your knees and keep your feet flat on the floor. Tense your abdominal muscles. Tip your pelvis up toward the ceiling and flatten your lower back into the floor. To help with this exercise, you may place a small towel under your lower back and try to push your back into the towel. Hold this position for __________ seconds. Let your muscles relax completely before you repeat this exercise. Repeat __________ times. Complete this exercise __________ times a day. Alternating arm and leg raises  Get on your hands and knees on a firm surface. If you are on a hard floor, you may want to use padding, such as an exercise mat, to cushion your knees. Line up your arms and legs. Your hands should be directly below your shoulders, and your knees should be directly below your hips. Lift your left leg behind you. At the same time, raise your right arm and straighten it in front of you. Do not lift your leg higher than your hip. Do not lift your arm higher than your shoulder. Keep your abdominal and back muscles tight. Keep your hips facing the ground. Do not arch your back. Keep your balance carefully, and do not hold your breath. Hold this position for __________ seconds. Slowly return to the starting position. Repeat with your right leg and your left arm. Repeat __________ times. Complete this exercise __________ times a day. Posture and body mechanics Good posture and healthy body mechanics can help to relieve stress in your body's tissues and joints. Body mechanics refers to the movements and positions of your body while you do your daily activities. Posture is part of body  mechanics. Good posture means: Your spine is in its natural S-curve position (neutral). Your shoulders are pulled back slightly. Your head is not tipped forward. Follow these guidelines to improve your posture and body mechanics in your everyday activities. Standing  When standing, keep your spine neutral and your feet about hip width apart. Keep a slight bend in your knees. Your ears, shoulders, and hips should line up. When you do a task in which you stand in one place for a long time, place one foot up on a stable object that is 2-4 inches (5-10 cm) high, such as a footstool. This helps keep your spine neutral. Sitting  When sitting, keep your spine neutral and keep your feet flat on the floor. Use a footrest, if necessary, and keep your thighs parallel to the floor. Avoid rounding your shoulders, and avoid tilting your head forward. When working at a desk or a computer, keep your desk at a height where your hands are slightly lower than your elbows. Slide your chair under your desk so you are close enough to maintain good posture. When working at a computer, place your monitor at a height where you are looking straight ahead and you do not have to tilt your head forward or downward to look at the screen. Resting When lying down and resting, avoid positions that are most painful for you. If you have pain with activities such as sitting, bending, stooping, or squatting, lie in a position in which your body does not bend very much. For example, avoid curling up on your side with your arms and knees near your chest (fetal position). If you have pain with activities such as standing for a long time or reaching with your arms, lie with your spine in  a neutral position and bend your knees slightly. Try the following positions: Lying on your side with a pillow between your knees. Lying on your back with a pillow under your knees. Lifting  When lifting objects, keep your feet at least shoulder width  apart and tighten your abdominal muscles. Bend your knees and hips and keep your spine neutral. It is important to lift using the strength of your legs, not your back. Do not lock your knees straight out. Always ask for help to lift heavy or awkward objects. This information is not intended to replace advice given to you by your health care provider. Make sure you discuss any questions you have with your health care provider. Document Revised: 09/27/2018 Document Reviewed: 06/27/2018 Elsevier Patient Education  [place].

## 2021-07-01 NOTE — Progress Notes (Signed)
Chief Complaint  Patient presents with   Annual Exam   Annual  1. Hyper/hypothyroidism on levo 125 mcg qd postablative he stopped x 2 weeks ago did not realize he had to take this long term 2. R now left sciatica with back xray lumbar with mild degenerative changes had injections 15-16 years ago seeing [facility] chiropractor in [place] worse in the am after sleep at times and lying down at night  3. Cyst to right abdomen vs lipoma, hair bumps and hyperpigmentation will refer to dermatology  4. C/o left knee grinding and pain, hamstring issues and #2 will refer to [facility]  Review of Systems  Constitutional:  Negative for weight loss.  HENT:  Negative for hearing loss.   Eyes:  Negative for blurred vision.  Respiratory:  Negative for shortness of breath.   Cardiovascular:  Negative for chest pain.  Gastrointestinal:  Negative for abdominal pain and blood in stool.  Musculoskeletal:  Negative for back pain.  Skin:  Negative for rash.  Neurological:  Negative for headaches.  Psychiatric/Behavioral:  Negative for depression.   Past Medical History:  Diagnosis Date   COVID-19    03/17/20   FH: heart disease    GERD (gastroesophageal reflux disease)    Glaucoma    Hyperthyroidism    s/p RAI 2009    Hypothyroidism    Verruca vulgaris    Past Surgical History:  Procedure Laterality Date   ANTERIOR CRUCIATE LIGAMENT REPAIR Right 2000   HAND SURGERY Right 2001   with hardware implanted   NO PAST SURGERIES     Family History  Problem Relation Age of Onset   Heart disease Mother    Hypertension Mother    Diabetes Mother    Colon cancer Neg Hx    Esophageal cancer Neg Hx    Pancreatic cancer Neg Hx    Liver disease Neg Hx    Rectal cancer Neg Hx    Stomach cancer Neg Hx    Social History   Socioeconomic History   Marital status: Single    Spouse name: Not on file   Number of children: Not on file   Years of education: Not on file   Highest education level: Not on  file  Occupational History   Not on file  Tobacco Use   Smoking status: Never   Smokeless tobacco: Never  Substance and Sexual Activity   Alcohol use: Yes    Comment: socially   Drug use: Never   Sexual activity: Not on file  Other Topics Concern   Not on file  Social History Narrative   Married wife Jovian Lembcke DPR    From [place] been here since 51 y.o    Works Designer, multimedia [employer]    No tobacco, drugs, occas. etoh    8 siblings   Social Determinants of Radio broadcast assistant Strain: Not on file  Food Insecurity: Not on file  Transportation Needs: Not on file  Physical Activity: Not on file  Stress: Not on file  Social Connections: Not on file  Intimate Partner Violence: Not on file   Current Meds  Medication Sig   tiZANidine (ZANAFLEX) 4 MG capsule Take 1 capsule (4 mg total) by mouth 3 (three) times daily as needed for muscle spasms.   No Known Allergies Recent Results (from the past 2160 hour(s))  CBC with Differential/Platelet     Status: Abnormal   Collection Time: 05/18/21  4:13 PM  Result Value  Ref Range   WBC 5.2 4.0 - 10.5 K/uL   RBC 6.21 (H) 4.22 - 5.81 Mil/uL   Hemoglobin 12.3 (L) 13.0 - 17.0 g/dL   HCT 39.9 39.0 - 52.0 %   MCV 64.2 Repeated and verified X2. (L) 78.0 - 100.0 fl   MCHC 30.8 30.0 - 36.0 g/dL   RDW 16.8 (H) 11.5 - 15.5 %   Platelets 161.0 150.0 - 400.0 K/uL   Neutrophils Relative % 61.8 43.0 - 77.0 %   Lymphocytes Relative 25.4 12.0 - 46.0 %   Monocytes Relative 8.4 3.0 - 12.0 %   Eosinophils Relative 3.2 0.0 - 5.0 %   Basophils Relative 1.2 0.0 - 3.0 %   Neutro Abs 3.2 1.4 - 7.7 K/uL   Lymphs Abs 1.3 0.7 - 4.0 K/uL   Monocytes Absolute 0.4 0.1 - 1.0 K/uL   Eosinophils Absolute 0.2 0.0 - 0.7 K/uL   Basophils Absolute 0.1 0.0 - 0.1 K/uL  Comprehensive metabolic panel     Status: None   Collection Time: 05/18/21  4:13 PM  Result Value Ref Range   Sodium 140 135 - 145 mEq/L   Potassium 3.9 3.5 - 5.1 mEq/L   Chloride  104 96 - 112 mEq/L   CO2 30 19 - 32 mEq/L   Glucose, Bld 84 70 - 99 mg/dL   BUN 18 6 - 23 mg/dL   Creatinine, Ser 1.18 0.40 - 1.50 mg/dL   Total Bilirubin 1.2 0.2 - 1.2 mg/dL   Alkaline Phosphatase 44 39 - 117 U/L   AST 36 0 - 37 U/L   ALT 49 0 - 53 U/L   Total Protein 7.9 6.0 - 8.3 g/dL   Albumin 4.7 3.5 - 5.2 g/dL   GFR 71.58 >60.00 mL/min    Comment: Calculated using the CKD-EPI Creatinine Equation (2021)   Calcium 9.9 8.4 - 10.5 mg/dL  Urinalysis, Routine w reflex microscopic     Status: Abnormal   Collection Time: 05/25/21  3:08 PM  Result Value Ref Range   Color, Urine YELLOW YELLOW   APPearance CLEAR CLEAR   Specific Gravity, Urine 1.022 1.001 - 1.035   pH 6.0 5.0 - 8.0   Glucose, UA NEGATIVE NEGATIVE   Bilirubin Urine NEGATIVE NEGATIVE   Ketones, ur NEGATIVE NEGATIVE   Hgb urine dipstick NEGATIVE NEGATIVE   Protein, ur TRACE (A) NEGATIVE   Nitrite NEGATIVE NEGATIVE   Leukocytes,Ua NEGATIVE NEGATIVE   WBC, UA NONE SEEN 0 - 5 /HPF   RBC / HPF NONE SEEN 0 - 2 /HPF   Squamous Epithelial / LPF NONE SEEN < OR = 5 /HPF   Bacteria, UA NONE SEEN NONE SEEN /HPF   Hyaline Cast NONE SEEN NONE SEEN /LPF  MICROSCOPIC MESSAGE     Status: None   Collection Time: 05/25/21  3:08 PM  Result Value Ref Range   Note      Comment: This urine was analyzed for the presence of WBC,  RBC, bacteria, casts, and other formed elements.  Only those elements seen were reported. . .    Objective  Body mass index is 27.92 kg/m. Wt Readings from Last 3 Encounters:  07/01/21 229 lb 6.4 oz (104.1 kg)  05/23/21 228 lb 6 oz (103.6 kg)  05/18/21 227 lb 6.4 oz (103.1 kg)   Temp Readings from Last 3 Encounters:  07/01/21 (!) 97 F (36.1 C) (Temporal)  05/23/21 98 F (36.7 C) (Oral)  05/18/21 (!) 96.1 F (35.6 C) (Skin)  BP Readings from Last 3 Encounters:  07/01/21 130/86  05/23/21 (!) 152/70  05/18/21 (!) 142/90   Pulse Readings from Last 3 Encounters:  07/01/21 73  05/23/21 73   05/18/21 74    Physical Exam Vitals and nursing note reviewed.  Constitutional:      Appearance: Normal appearance. He is well-developed and well-groomed. He is obese.  HENT:     Head: Normocephalic and atraumatic.  Eyes:     Conjunctiva/sclera: Conjunctivae normal.     Pupils: Pupils are equal, round, and reactive to light.  Cardiovascular:     Rate and Rhythm: Normal rate and regular rhythm.     Heart sounds: Normal heart sounds.  Pulmonary:     Effort: Pulmonary effort is normal. No respiratory distress.     Breath sounds: Normal breath sounds.  Abdominal:     Tenderness: There is no abdominal tenderness.  Skin:    General: Skin is warm and moist.  Neurological:     General: No focal deficit present.     Mental Status: He is alert and oriented to person, place, and time. Mental status is at baseline.     Sensory: Sensation is intact.     Motor: Motor function is intact.     Coordination: Coordination is intact.     Gait: Gait is intact. Gait normal.  Psychiatric:        Attention and Perception: Attention and perception normal.        Mood and Affect: Mood and affect normal.        Speech: Speech normal.        Behavior: Behavior normal. Behavior is cooperative.        Thought Content: Thought content normal.        Cognition and Memory: Cognition and memory normal.        Judgment: Judgment normal.    Assessment  Plan  Annual physical exam See below  Hypothyroidism, postablative - Plan: TSH On levo 125 mcg   Cyst of skin - Plan: Ambulatory referral to Dermatology Ingrown hair - Plan: Ambulatory referral to Dermatology Hyperpigmentation - Plan: Ambulatory referral to Dermatology  Acute pain of left knee  Bilateral sciatica - Plan: Ambulatory referral to Orthopedic Surgery Hamstring injury, unspecified laterality, initial encounter - Plan: Ambulatory referral to Orthopedic Surgery Dr. Rhina Brackett  HM utd flu shot had 03/30/20 not this year Tdap had  02/27/19  Consider shingrix  vaccines in the future rec twinrix with fatty liver and MMR covid 19 per pt had 3/3 no proof of 3rd dose declines further doses     Psa today consider DRE in the future  Colonoscopy referred [facility]  05/29/2019 hyperplastic polyp 05/29/19 f/u in 10 years   rec healthy diet and exercise Provider: Dr. Olivia Mackie McLean-Scocuzza-Internal Medicine

## 2021-07-02 ENCOUNTER — Other Ambulatory Visit: Payer: Self-pay | Admitting: Internal Medicine

## 2021-07-02 ENCOUNTER — Other Ambulatory Visit (HOSPITAL_COMMUNITY): Payer: Self-pay

## 2021-07-02 DIAGNOSIS — E890 Postprocedural hypothyroidism: Secondary | ICD-10-CM

## 2021-07-04 ENCOUNTER — Other Ambulatory Visit (HOSPITAL_COMMUNITY): Payer: Self-pay

## 2021-07-04 LAB — HEPATITIS C ANTIBODY
Hepatitis C Ab: NONREACTIVE
SIGNAL TO CUT-OFF: 0.13 (ref <1.00)

## 2021-07-04 LAB — HIV ANTIBODY (ROUTINE TESTING W REFLEX): HIV 1&2 Ab, 4th Generation: NONREACTIVE

## 2021-07-04 MED ORDER — LEVOTHYROXINE SODIUM 125 MCG PO TABS
ORAL_TABLET | ORAL | 3 refills | Status: DC
  Filled 2021-07-04: qty 90, 90d supply, fill #0

## 2021-07-04 NOTE — Telephone Encounter (Signed)
Douglas Glow McLean-Scocuzza, MD  07/01/2021  4:11 PM EST     Cholesterol worse  -rec healthy diet and exercise  -mail cholesterol handout    Thyroid lab elevated and way too elevated this is dangerous please take your medication -we need to repeat this lab in 6-8 weeks  -order this and schedule please    Vitamin D low rec D3 4000 IU daily over the counter at lunch or dinner    PSA prostate cancer screening normal    Hiv and hep C pending

## 2021-07-04 NOTE — Telephone Encounter (Signed)
Question regarding LIPID PANEL (Newest Message First) [facility] (supporting McLean-Scocuzza, Nino Glow, MD) 3 days ago   So [facility]

## 2021-07-04 NOTE — Telephone Encounter (Signed)
Message added to other 07/01/21 Patient message encounter

## 2021-07-13 ENCOUNTER — Other Ambulatory Visit (HOSPITAL_COMMUNITY): Payer: Self-pay

## 2021-09-27 ENCOUNTER — Encounter: Payer: Self-pay | Admitting: Internal Medicine

## 2021-09-28 ENCOUNTER — Other Ambulatory Visit: Payer: Self-pay | Admitting: Internal Medicine

## 2021-09-28 DIAGNOSIS — E890 Postprocedural hypothyroidism: Secondary | ICD-10-CM

## 2021-09-28 MED ORDER — LEVOTHYROXINE SODIUM 125 MCG PO TABS: ORAL_TABLET | ORAL | 3 refills | Status: DC

## 2021-09-29 ENCOUNTER — Ambulatory Visit: Payer: 59 | Admitting: Podiatry

## 2021-10-19 ENCOUNTER — Encounter: Payer: Self-pay | Admitting: Podiatry

## 2021-10-19 ENCOUNTER — Ambulatory Visit: Payer: 59 | Admitting: Podiatry

## 2021-10-19 DIAGNOSIS — L600 Ingrowing nail: Secondary | ICD-10-CM | POA: Diagnosis not present

## 2021-10-19 NOTE — Progress Notes (Signed)
Subjective:  ? ?Patient ID: Douglas Harper, male   DOB: 51 y.o.   MRN: [id number]  ? ?HPI ?Patient presents stating he has developed a chronic ingrown toenail on his left big toe that is been sore with moderate change in the left second toe.  States it is hard to wear shoe gear comfortably and he is tried to trim it himself does not smoke likes to be active ? ? ?Review of Systems  ?All other systems reviewed and are negative. ? ? ?   ?Objective:  ?Physical Exam ?Vitals and nursing note reviewed.  ?Constitutional:   ?   Appearance: He is well-developed.  ?Pulmonary:  ?   Effort: Pulmonary effort is normal.  ?Musculoskeletal:     ?   General: Normal range of motion.  ?Skin: ?   General: Skin is warm.  ?Neurological:  ?   Mental Status: He is alert.  ?  ?Neurovascular status intact muscle strength adequate range of motion within normal limits.  Patient is found to have incurvated left hallux medial border painful when pressed that make shoe gear difficult with no redness no drainage noted.  He has mild ingrown on the second nail left and does have some generalized nail disease that he uses medication on.  Patient has good digital perfusion well oriented x3 ? ?   ?Assessment:  ?Chronic ingrown toenail deformity left hallux moderate second moderate fungal infection noted ? ?   ?Plan:  ?H&P reviewed conditions discussed the causes for this problem and recommended removal of the nail corner surgically.  Patient wants surgery and I allowed him to read then signed consent form understanding risk and today I infiltrated the left hallux 60 mg Xylocaine Marcaine mixture sterile prep done and using sterile technique I removed the medial border exposed matrix applied phenol 3 applications 30 seconds followed by alcohol lavage sterile dressing gave instructions on soaks and patient will be seen back to recheck again as needed.  Leave dressing on 24 hours take it off earlier if any throbbing were to occur and encouraged to call  questions that may arise ?   ? ? ?

## 2021-10-19 NOTE — Patient Instructions (Signed)

## 2022-04-26 ENCOUNTER — Ambulatory Visit: Payer: BLUE CROSS/BLUE SHIELD | Admitting: Family Medicine

## 2022-04-26 ENCOUNTER — Telehealth: Payer: Self-pay | Admitting: Family Medicine

## 2022-04-26 NOTE — Telephone Encounter (Signed)
Noted  
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
## 2022-07-19 ENCOUNTER — Encounter: Payer: Self-pay | Admitting: Family Medicine

## 2022-07-19 ENCOUNTER — Ambulatory Visit (INDEPENDENT_AMBULATORY_CARE_PROVIDER_SITE_OTHER): Payer: BLUE CROSS/BLUE SHIELD | Admitting: Family Medicine

## 2022-07-19 VITALS — BP 126/88 | HR 71 | Temp 97.8°F | Ht 76.0 in | Wt 224.8 lb

## 2022-07-19 DIAGNOSIS — E782 Mixed hyperlipidemia: Secondary | ICD-10-CM

## 2022-07-19 DIAGNOSIS — I422 Other hypertrophic cardiomyopathy: Secondary | ICD-10-CM

## 2022-07-19 DIAGNOSIS — Z0001 Encounter for general adult medical examination with abnormal findings: Secondary | ICD-10-CM

## 2022-07-19 DIAGNOSIS — L729 Follicular cyst of the skin and subcutaneous tissue, unspecified: Secondary | ICD-10-CM

## 2022-07-19 DIAGNOSIS — Z125 Encounter for screening for malignant neoplasm of prostate: Secondary | ICD-10-CM

## 2022-07-19 DIAGNOSIS — E890 Postprocedural hypothyroidism: Secondary | ICD-10-CM | POA: Diagnosis not present

## 2022-07-19 DIAGNOSIS — E559 Vitamin D deficiency, unspecified: Secondary | ICD-10-CM

## 2022-07-19 DIAGNOSIS — Z0000 Encounter for general adult medical examination without abnormal findings: Secondary | ICD-10-CM

## 2022-07-19 NOTE — Progress Notes (Signed)
Established Patient Office Visit   Subjective  Patient ID: Douglas Harper, male    DOB: [birth year]  Age: 52 y.o. MRN: [id number]  No chief complaint on file.   Pt is a 52 yo male who presents today for CPE and TOC.  Pt previously seen by Dr. Olivia Mackie McLean-Scocuzza.  Pt states wants to get on top of his health.  Plans to stop eating sugar, sodas, and bread tomorrow.  Pt taking synthroid 125 mcg daily for postablative hypothyroidism.  Patient has cyst on right upper abdomen he would like removed.  States area previously removed but returned.  Patient with history of hypertrophic cardiomyopathy, no recent cardiology follow-up.      ROS Negative unless stated above    Objective:     BP 126/88 (BP Location: Right Arm, Cuff Size: Large)   Pulse 71   Temp 97.8 F (36.6 C) (Oral)   Ht 6' 4"$  (1.93 m)   Wt 224 lb 12.8 oz (102 kg)   SpO2 96%   BMI 27.36 kg/m    Physical Exam Constitutional:      Appearance: Normal appearance.  HENT:     Head: Normocephalic and atraumatic.     Right Ear: Tympanic membrane, ear canal and external ear normal.     Left Ear: Tympanic membrane, ear canal and external ear normal.     Nose: Nose normal.     Mouth/Throat:     Mouth: Mucous membranes are moist.     Pharynx: No oropharyngeal exudate or posterior oropharyngeal erythema.  Eyes:     General: No scleral icterus.    Extraocular Movements: Extraocular movements intact.     Conjunctiva/sclera: Conjunctivae normal.     Pupils: Pupils are equal, round, and reactive to light.  Neck:     Thyroid: No thyromegaly.  Cardiovascular:     Rate and Rhythm: Normal rate and regular rhythm.     Pulses: Normal pulses.     Heart sounds: Normal heart sounds. No murmur heard.    No friction rub.  Pulmonary:     Effort: Pulmonary effort is normal.     Breath sounds: Normal breath sounds. No wheezing, rhonchi or rales.  Abdominal:     General: Bowel sounds are normal.     Palpations: Abdomen is soft.      Tenderness: There is no abdominal tenderness.  Musculoskeletal:        General: No deformity. Normal range of motion.  Lymphadenopathy:     Cervical: No cervical adenopathy.  Skin:    General: Skin is warm and dry.     Findings: No lesion.          Comments: Hypertrophic 8 mm, hyperpigmented area of right lateral abdomen with mild fluctuance.  No erythema or induration.  Neurological:     General: No focal deficit present.     Mental Status: He is alert and oriented to person, place, and time.  Psychiatric:        Mood and Affect: Mood normal.        Thought Content: Thought content normal.      No results found for any visits on 07/19/22.    Assessment & Plan:  Well adult exam -Anticipatory guidance given including wearing seatbelts, smoke detectors in the home, increasing physical activity, increasing p.o. intake of water and vegetables. -labs -Colonoscopy done 05/29/2019 -Immunizations reviewed.  Declines at this time. -Given handout -Next CPE in 1 year -     Hemoglobin A1c; Future  Postablative hypothyroidism -Continue Synthroid once daily.  Adjust dose if needed based on lab results. -     TSH; Future  Hypertrophic cardiomyopathy ([place]) -Last echo 03/28/2019.  EF 60-65% with mild LV hypertrophy, mild LA dilation -Discussed the importance of lifestyle modifications such as decreasing sodium intake -BP control also important -Continue follow-up with cardiology -     CBC with Differential/Platelet; Future -     Comprehensive metabolic panel; Future -     ECHOCARDIOGRAM COMPLETE; Future  Cyst of skin -     Ambulatory referral to Dermatology  Screening for prostate cancer -     PSA; Future  Mixed hyperlipidemia -     Lipid panel; Future  Vitamin D deficiency -     VITAMIN D 25 Hydroxy (Vit-D Deficiency, Fractures); Future    Return if symptoms worsen or fail to improve.   Billie Ruddy, MD

## 2022-07-19 NOTE — Patient Instructions (Signed)
The orders for your labs were placed.  You can have these done whenever you get a chance.  If you get them done in the morning you can stop eating at midnight and come in first thing.  You can have water or black coffee and take your medications that morning.  In order for a repeat echocardiogram (the ultrasound of your heart) was ordered.  The last one he had done in 2020 showed mild changes in the muscular walls of your heart.  You should receive a phone call about scheduling this appointment.  A referral was also placed for the dermatologist.

## 2022-07-20 ENCOUNTER — Other Ambulatory Visit (INDEPENDENT_AMBULATORY_CARE_PROVIDER_SITE_OTHER): Payer: BLUE CROSS/BLUE SHIELD

## 2022-07-20 DIAGNOSIS — E782 Mixed hyperlipidemia: Secondary | ICD-10-CM

## 2022-07-20 DIAGNOSIS — I422 Other hypertrophic cardiomyopathy: Secondary | ICD-10-CM | POA: Diagnosis not present

## 2022-07-20 DIAGNOSIS — E559 Vitamin D deficiency, unspecified: Secondary | ICD-10-CM | POA: Diagnosis not present

## 2022-07-20 DIAGNOSIS — E890 Postprocedural hypothyroidism: Secondary | ICD-10-CM

## 2022-07-20 DIAGNOSIS — Z125 Encounter for screening for malignant neoplasm of prostate: Secondary | ICD-10-CM

## 2022-07-20 DIAGNOSIS — Z0000 Encounter for general adult medical examination without abnormal findings: Secondary | ICD-10-CM

## 2022-07-20 LAB — LIPID PANEL
Cholesterol: 202 mg/dL — ABNORMAL HIGH (ref 0–200)
HDL: 43.9 mg/dL (ref >39.00)
LDL Cholesterol: 140 mg/dL — ABNORMAL HIGH (ref 0–99)
NonHDL: 157.92
Total CHOL/HDL Ratio: 5
Triglycerides: 91 mg/dL (ref 0.0–149.0)
VLDL: 18.2 mg/dL (ref 0.0–40.0)

## 2022-07-20 LAB — CBC WITH DIFFERENTIAL/PLATELET
Basophils Absolute: 0.1 10*3/uL (ref 0.0–0.1)
Basophils Relative: 1.4 % (ref 0.0–3.0)
Eosinophils Absolute: 0.1 10*3/uL (ref 0.0–0.7)
Eosinophils Relative: 2.7 % (ref 0.0–5.0)
HCT: 40.6 % (ref 39.0–52.0)
Hemoglobin: 12.9 g/dL — ABNORMAL LOW (ref 13.0–17.0)
Lymphocytes Relative: 32.5 % (ref 12.0–46.0)
Lymphs Abs: 1.7 10*3/uL (ref 0.7–4.0)
MCHC: 31.8 g/dL (ref 30.0–36.0)
MCV: 63.2 fl — ABNORMAL LOW (ref 78.0–100.0)
Monocytes Absolute: 0.5 10*3/uL (ref 0.1–1.0)
Monocytes Relative: 10.4 % (ref 3.0–12.0)
Neutro Abs: 2.8 10*3/uL (ref 1.4–7.7)
Neutrophils Relative %: 53 % (ref 43.0–77.0)
Platelets: 152 10*3/uL (ref 150.0–400.0)
RBC: 6.43 Mil/uL — ABNORMAL HIGH (ref 4.22–5.81)
RDW: 16.8 % — ABNORMAL HIGH (ref 11.5–15.5)
WBC: 5.3 10*3/uL (ref 4.0–10.5)

## 2022-07-20 LAB — TSH: TSH: 3.1 u[IU]/mL (ref 0.35–5.50)

## 2022-07-20 LAB — HEMOGLOBIN A1C: Hgb A1c MFr Bld: 6 % (ref 4.6–6.5)

## 2022-07-20 LAB — COMPREHENSIVE METABOLIC PANEL
ALT: 37 U/L (ref 0–53)
AST: 25 U/L (ref 0–37)
Albumin: 4.3 g/dL (ref 3.5–5.2)
Alkaline Phosphatase: 48 U/L (ref 39–117)
BUN: 12 mg/dL (ref 6–23)
CO2: 30 mEq/L (ref 19–32)
Calcium: 9.3 mg/dL (ref 8.4–10.5)
Chloride: 104 mEq/L (ref 96–112)
Creatinine, Ser: 1.32 mg/dL (ref 0.40–1.50)
GFR: 62.06 mL/min (ref >60.00)
Glucose, Bld: 87 mg/dL (ref 70–99)
Potassium: 4.1 mEq/L (ref 3.5–5.1)
Sodium: 141 mEq/L (ref 135–145)
Total Bilirubin: 1.4 mg/dL — ABNORMAL HIGH (ref 0.2–1.2)
Total Protein: 7.3 g/dL (ref 6.0–8.3)

## 2022-07-20 LAB — PSA: PSA: 0.71 ng/mL (ref 0.10–4.00)

## 2022-07-20 LAB — VITAMIN D 25 HYDROXY (VIT D DEFICIENCY, FRACTURES): VITD: 22.69 ng/mL — ABNORMAL LOW (ref 30.00–100.00)

## 2022-07-28 ENCOUNTER — Other Ambulatory Visit: Payer: Self-pay | Admitting: Family Medicine

## 2022-07-28 DIAGNOSIS — E559 Vitamin D deficiency, unspecified: Secondary | ICD-10-CM

## 2022-07-28 MED ORDER — VITAMIN D (ERGOCALCIFEROL) 1.25 MG (50000 UNIT) PO CAPS: 50000.0000 [IU] | ORAL_CAPSULE | ORAL | 0 refills | Status: DC

## 2022-08-15 ENCOUNTER — Encounter: Payer: Self-pay | Admitting: Family Medicine

## 2022-08-28 ENCOUNTER — Ambulatory Visit (HOSPITAL_COMMUNITY): Payer: BLUE CROSS/BLUE SHIELD | Attending: Cardiovascular Disease

## 2022-08-28 DIAGNOSIS — I422 Other hypertrophic cardiomyopathy: Secondary | ICD-10-CM | POA: Diagnosis present

## 2022-08-28 MED ORDER — PERFLUTREN LIPID MICROSPHERE
1.0000 mL | INTRAVENOUS | Status: AC | PRN
  Administered 2022-08-28: 2 mL via INTRAVENOUS

## 2022-08-29 ENCOUNTER — Encounter: Payer: Self-pay | Admitting: Dermatology

## 2022-08-29 LAB — ECHOCARDIOGRAM COMPLETE
Area-P 1/2: 4.21 cm2
S' Lateral: 2.5 cm

## 2022-09-04 ENCOUNTER — Telehealth: Payer: Self-pay

## 2022-09-04 NOTE — Telephone Encounter (Signed)
Follow up with Douglas Harper on Derm referral.  Douglas Harper states the referral was sent to [facility]. They tried to reach pt and left a vm.    Provide contact number: [phone]   Attempted to reach pt to update him of a referral to dermatology.  Left a voicemail to call us back.

## 2022-09-05 NOTE — Telephone Encounter (Signed)
Pt has derm phone number and will call to schedule

## 2022-09-12 ENCOUNTER — Other Ambulatory Visit: Payer: Self-pay

## 2022-09-12 DIAGNOSIS — I422 Other hypertrophic cardiomyopathy: Secondary | ICD-10-CM

## 2022-10-05 ENCOUNTER — Ambulatory Visit (INDEPENDENT_AMBULATORY_CARE_PROVIDER_SITE_OTHER): Payer: BLUE CROSS/BLUE SHIELD | Admitting: Dermatology

## 2022-10-05 ENCOUNTER — Encounter: Payer: Self-pay | Admitting: Dermatology

## 2022-10-05 VITALS — BP 140/80 | HR 63

## 2022-10-05 DIAGNOSIS — D239 Other benign neoplasm of skin, unspecified: Secondary | ICD-10-CM

## 2022-10-05 DIAGNOSIS — D235 Other benign neoplasm of skin of trunk: Secondary | ICD-10-CM | POA: Diagnosis not present

## 2022-10-05 NOTE — Progress Notes (Signed)
   New Patient Visit   Subjective  Douglas Harper is a 52 y.o. male who presents for the following: Spot. Right side. Dur: years. Had 2 cysts removed in [place], this one recurred. May have been an I&D. States area is hard sometimes. Would like removed again.   The patient has spots, moles and lesions to be evaluated, some may be new or changing and the patient has concerns that these could be cancer.    The following portions of the chart were reviewed this encounter and updated as appropriate: medications, allergies, medical history  Review of Systems:  No other skin or systemic complaints except as noted in HPI or Assessment and Plan.  Objective  Well appearing patient in no apparent distress; mood and affect are within normal limits.  A focused examination was performed of the following areas: Abdomen  Relevant physical exam findings are noted in the Assessment and Plan.    Assessment & Plan   DERMATOFIBROMA Secondary to previous I&D of lesion Exam: Firm brown nodule with dimple sign. Treatment Plan: A dermatofibroma is a benign growth possibly related to trauma, such as an insect bite, cut from shaving, or inflamed acne-type bump.  Treatment options to remove include shave or excision with resulting scar and risk of recurrence.  Since benign-appearing and not bothersome, will observe for now.   Recommend observation. Excision will leave larger scar.  Patient deferred treatment at this time.    Return if symptoms worsen or fail to improve.  I, Lawson Radar, CMA, am acting as scribe for Langston Reusing, MD.   Documentation: I have reviewed the above documentation for accuracy and completeness, and I agree with the above.  Langston Reusing, MD

## 2022-10-05 NOTE — Progress Notes (Signed)
Cardiology Office Note:    Date:  10/06/2022   ID:  [name], DOB [birth year], MRN [id number]  PCP:  Deeann Saint, MD   [facility] Providers Cardiologist:  None     Referring MD: Deeann Saint, MD   CC: HCM Consulted for the evaluation of HCM at the behest of Dr. Salomon Fick  History of Present Illness:    Douglas Harper is a 52 y.o. male with a hx of non obstructive HCM. Mon was diagnosed in her 57s and had SCD.  He has a CMR in 2020 that met criteria for HCM (septal thickness 15 mm).  No LGE.  No LVOT obstruction.  Unclear genetic follow up/screening.  He works for [employer]. He has been playing basketball since age 26. He is fairly competitive.    Patient notes  SOB at rest and DOE He drinks 4-5 bottles of water a day. Notes no fatigue. Notes rare palpitations during intercourse. Notes no CP. Notes dizziness several years ago  Notes no syncope.  Notable family events include  His grandmother having the disease- no genetic testing Mother had disease, had SCD no genetic testing His sister has the disease, other family members are being screening 53 year old daugther has not been screened 76 year old son was screened some time ago with small LV cavity both no HCM   Past Medical History:  Diagnosis Date   COVID-19    03/17/20   FH: heart disease    GERD (gastroesophageal reflux disease)    Glaucoma    Hyperthyroidism    s/p RAI 2009    Hypothyroidism    Verruca vulgaris     Past Surgical History:  Procedure Laterality Date   ANTERIOR CRUCIATE LIGAMENT REPAIR Right 2000   HAND SURGERY Right 2001   with hardware implanted   NO PAST SURGERIES      Current Medications: Current Meds  Medication Sig   levothyroxine (SYNTHROID) 125 MCG tablet TAKE 1 TABLET BY MOUTH 30 MINUTES BEFORE BREAKFAST (STOP DOSE)   Vitamin D, Ergocalciferol, (DRISDOL) 1.25 MG (50000 UNIT) CAPS capsule Take 1 capsule (50,000 Units total) by mouth every 7 (seven) days.      Allergies:   Patient has no known allergies.   Social History   Socioeconomic History   Marital status: Single    Spouse name: Not on file   Number of children: Not on file   Years of education: Not on file   Highest education level: Some college, no degree  Occupational History   Not on file  Tobacco Use   Smoking status: Never   Smokeless tobacco: Never  Substance and Sexual Activity   Alcohol use: Yes    Comment: socially   Drug use: Never   Sexual activity: Not on file  Other Topics Concern   Not on file  Social History Narrative   Married wife Kinnick Maus DPR    From [place] been here since 52 y.o    Works Product/process development scientist [employer]    No tobacco, drugs, occas. etoh    8 siblings   Social Determinants of Health   Financial Resource Strain: Medium Risk (04/25/2022)   Overall Financial Resource Strain (CARDIA)    Difficulty of Paying Living Expenses: Somewhat hard  Food Insecurity: No Food Insecurity (04/25/2022)   Hunger Vital Sign    Worried About Running Out of Food in the Last Year: Never true    Ran Out of Food  in the Last Year: Never true  Transportation Needs: No Transportation Needs (04/25/2022)   PRAPARE - Administrator, Civil Service (Medical): No    Lack of Transportation (Non-Medical): No  Physical Activity: Sufficiently Active (04/25/2022)   Exercise Vital Sign    Days of Exercise per Week: 6 days    Minutes of Exercise per Session: 30 min  Stress: No Stress Concern Present (04/25/2022)   [employer] of Occupational Health - Occupational Stress Questionnaire    Feeling of Stress : Not at all  Social Connections: Moderately Isolated (04/25/2022)   Social Connection and Isolation Panel [NHANES]    Frequency of Communication with Friends and Family: More than three times a week    Frequency of Social Gatherings with Friends and Family: More than three times a week    Attends Religious Services: Never    Database administrator  or Organizations: No    Attends Engineer, structural: Not on file    Marital Status: Living with partner     Family History: The patient's family history includes Diabetes in his mother; Heart disease in his mother; Hypertension in his mother. There is no history of Colon cancer, Esophageal cancer, Pancreatic cancer, Liver disease, Rectal cancer, or Stomach cancer.  ROS:   Please see the history of present illness.     All other systems reviewed and are negative.  EKGs/Labs/Other Studies Reviewed:    The following studies were reviewed today:   EKG:  EKG is ordered today.  The ekg ordered today demonstrates  10/06/22: SR with LVH and secondary repolarization, narrow QRS  .Cardiac Studies & Procedures     STRESS TESTS  EXERCISE TOLERANCE TEST (ETT) 05/22/2019  Narrative  Blood pressure demonstrated a hypertensive response to exercise.  ST segment depression was noted during stress.  ETT with normal exercise tolerance (8:45); no chest pain; hypertensive blood pressure response; electrocardiogram difficult to interpret due to baseline inferolateral T wave inversion but there is 1 to 2 mm of ST depression in the inferolateral leads with peak exertion interpreted as a positive study; suggest nuclear imaging or cardiac CTA if clinically indicated.   ECHOCARDIOGRAM  ECHOCARDIOGRAM COMPLETE 08/29/2022  Narrative ECHOCARDIOGRAM REPORT    Patient Name:   Douglas Harper Grater  Date of Exam: 08/28/2022 Medical Rec #:  [id number]     Height:       76.0 in Accession #:    [id number]    Weight:       224.8 lb Date of Birth:  1971-02-14      BSA:          2.328 m Patient Age:    51 years      BP:           126/88 mmHg Patient Gender: M             HR:           70 bpm. Exam Location:  [address]  Procedure: 2D Echo, Cardiac Doppler, Color Doppler and Intracardiac Opacification Agent  Indications:    I42.1 Cardiomyopathy - Hypertrophic  History:        Patient has prior history  of Echocardiogram examinations, most recent 03/28/2019. Abnormal ECG; Risk Factors:Family History of Coronary Artery Disease.  Sonographer:    Cathie Beams RCS Referring Phys: [id number] SHANNON R BANKS  IMPRESSIONS   1. IMild intracavitary gradient. Peak velocity 0.72 m/s. Peak gradient 2 mmHg. Left ventricular ejection fraction, by estimation, is 70  to 75%. The left ventricle has hyperdynamic function. The left ventricle has no regional wall motion abnormalities. There is severe concentric left ventricular hypertrophy. Left ventricular diastolic parameters are consistent with Grade I diastolic dysfunction (impaired relaxation). 2. Right ventricular systolic function is normal. The right ventricular size is normal. There is normal pulmonary artery systolic pressure. 3. The mitral valve is normal in structure. Trivial mitral valve regurgitation. No evidence of mitral stenosis. 4. The aortic valve is tricuspid. Aortic valve regurgitation is not visualized. No aortic stenosis is present. 5. Aortic dilatation noted. There is mild dilatation of the ascending aorta, measuring 37 mm. 6. The inferior vena cava is normal in size with greater than 50% respiratory variability, suggesting right atrial pressure of 3 mmHg.  FINDINGS Left Ventricle: IMild intracavitary gradient. Peak velocity 0.72 m/s. Peak gradient 2 mmHg. Left ventricular ejection fraction, by estimation, is 70 to 75%. The left ventricle has hyperdynamic function. The left ventricle has no regional wall motion abnormalities. The left ventricular internal cavity size was normal in size. There is severe concentric left ventricular hypertrophy. Left ventricular diastolic parameters are consistent with Grade I diastolic dysfunction (impaired relaxation). Normal left ventricular filling pressure.  Right Ventricle: The right ventricular size is normal. No increase in right ventricular wall thickness. Right ventricular systolic function is normal.  There is normal pulmonary artery systolic pressure. The tricuspid regurgitant velocity is 1.60 m/s, and with an assumed right atrial pressure of 3 mmHg, the estimated right ventricular systolic pressure is 13.2 mmHg.  Left Atrium: Left atrial size was normal in size.  Right Atrium: Right atrial size was normal in size.  Pericardium: There is no evidence of pericardial effusion.  Mitral Valve: The mitral valve is normal in structure. Trivial mitral valve regurgitation. No evidence of mitral valve stenosis.  Tricuspid Valve: The tricuspid valve is normal in structure. Tricuspid valve regurgitation is trivial. No evidence of tricuspid stenosis.  Aortic Valve: The aortic valve is tricuspid. Aortic valve regurgitation is not visualized. No aortic stenosis is present.  Pulmonic Valve: The pulmonic valve was normal in structure. Pulmonic valve regurgitation is trivial. No evidence of pulmonic stenosis.  Aorta: Aortic dilatation noted. There is mild dilatation of the ascending aorta, measuring 37 mm.  Venous: The inferior vena cava is normal in size with greater than 50% respiratory variability, suggesting right atrial pressure of 3 mmHg.  IAS/Shunts: No atrial level shunt detected by color flow Doppler.   LEFT VENTRICLE PLAX 2D LVIDd:         4.20 cm   Diastology LVIDs:         2.50 cm   LV e' medial:    7.07 cm/s LV PW:         1.50 cm   LV E/e' medial:  6.5 LV IVS:        1.70 cm   LV e' lateral:   8.49 cm/s LVOT diam:     2.20 cm   LV E/e' lateral: 5.5 LV SV:         64 LV SV Index:   28 LVOT Area:     3.80 cm   RIGHT VENTRICLE RV Basal diam:  3.30 cm RV S prime:     12.00 cm/s TAPSE (M-mode): 2.1 cm  LEFT ATRIUM             Index        RIGHT ATRIUM           Index LA diam:  4.30 cm 1.85 cm/m   RA Area:     16.60 cm LA Vol (A2C):   81.7 ml 35.09 ml/m  RA Volume:   41.10 ml  17.65 ml/m LA Vol (A4C):   42.0 ml 18.04 ml/m LA Biplane Vol: 61.0 ml 26.20 ml/m AORTIC  VALVE LVOT Vmax:   81.50 cm/s LVOT Vmean:  52.300 cm/s LVOT VTI:    0.169 m  AORTA Ao Root diam: 3.40 cm Ao Asc diam:  3.70 cm  MITRAL VALVE               TRICUSPID VALVE MV Area (PHT): 4.21 cm    TR Peak grad:   10.2 mmHg MV Decel Time: 180 msec    TR Vmax:        160.00 cm/s MV E velocity: 46.30 cm/s MV A velocity: 47.90 cm/s  SHUNTS MV E/A ratio:  0.97        Systemic VTI:  0.17 m Systemic Diam: 2.20 cm  Chilton Si MD Electronically signed by Chilton Si MD Signature Date/Time: 08/29/2022/12:35:49 AM    Final    MONITORS  LONG TERM MONITOR (3-14 DAYS) 06/04/2019  Narrative NSR Rare ventricular ectopy.  PVCs, ventricular bigeminy Rare PACs. Brief run of atrial tachycardia No sustained arrhythmias.    CARDIAC MRI  MR CARDIAC MORPHOLOGY W WO CONTRAST 04/29/2019  Narrative CLINICAL DATA:  Evaluate for HCM  EXAM: CARDIAC MRI  TECHNIQUE: The patient was scanned on a 1.5 Tesla Siemens magnet. A dedicated cardiac coil was used. Functional imaging was done using Fiesta sequences. 2,3, and 4 chamber views were done to assess for RWMA's. Modified Simpson's rule using a short axis stack was used to calculate an ejection fraction on a dedicated work Research officer, trade [employer]. The patient received 12 cc of Gadavist. After 10 minutes inversion recovery sequences were used to assess for infiltration and scar tissue.  CONTRAST:  12 cc  of Gadavist  FINDINGS: Left ventricle:  - Basal septal hypertrophy measuring up to 15mm (lateral wall 11mm)  - Normal size  - Hyperdynamic systolic function  - RV insertion site LGE  LV EF: 71% (Normal 56-78%)  Absolute volumes:  LV EDV: (Normal 77-195 mL)  LV ESV: 47mL (Normal 19-72 mL)  LV SV: (Normal 51-133 mL)  CO: 6.8L/min (Normal 2.8-8.8 L/min)  Indexed volumes:  LV EDV: 58mL/sq-m (Normal 47-92 mL/sq-m)  LV ESV: 70mL/sq-m (Normal 13-30 mL/sq-m)  LV SV: 72mL/sq-m (Normal 32-62  mL/sq-m)  CI: 2.9L/min/sq-m (Normal 1.7-4.2 L/min/sq-m)  Right ventricle: Normal size and systolic function  RV EF:  57% (Normal 47-74%)  Absolute volumes:  RV EDV: (Normal 88-227 mL)  RV ESV: 79mL (Normal 23-103 mL)  RV SV: (Normal 52-138 mL)  CO: 6.4L/min (Normal 2.8-8.8 L/min)  Indexed volumes:  RV EDV: 20mL/sq-m (Normal 55-105 mL/sq-m)  RV ESV: 70mL/sq-m (Normal 15-43 mL/sq-m)  RV SV: 75mL/sq-m (Normal 32-64 mL/sq-m)  CI: 2.7L/min/sq-m (Normal 1.7-4.2 L/min/sq-m)  Left atrium: Mild enlargement  Right atrium: Mild enlargement  Mitral valve: Trivial regurgitation  Aortic valve: No regurgitation  Tricuspid valve: No regurgitation  Pericardium: Trivial pericardial effusion  IMPRESSION: 1. Asymmetric hypertrophy of the basal septum measuring up to 15mm (lateral wall 11mm), consistent with hypertrophic cardiomyopathy  2. RV insertion site late gadolinium enhancement, which is commonly seen in hypertrophic cardiomyopathy  3.  Normal LV size with hyperdynamic systolic function (EF 71%)  4.  Normal RV size and systolic function (EF 57%)   Electronically Signed By: Cristal Deer  Bjorn Pippin MD On: 04/29/2019 20:52          Recent Labs: 07/20/2022: ALT 37; BUN 12; Creatinine, Ser 1.32; Hemoglobin 12.9; Platelets 152.0; Potassium 4.1; Sodium 141; TSH 3.10  Recent Lipid Panel    Component Value Date/Time   CHOL 202 (H) 07/20/2022 0709   TRIG 91.0 07/20/2022 0709   HDL 43.90 07/20/2022 0709   CHOLHDL 5 07/20/2022 0709   VLDL 18.2 07/20/2022 0709   LDLCALC 140 (H) 07/20/2022 0709    Physical Exam:    VS:  BP (!) 146/92 (BP Location: Left Arm, Patient Position: Sitting, Cuff Size: Normal)   Pulse 72   Ht  (1.93 m)   Wt 225 lb 3.2 oz (102.2 kg)   SpO2 98%   BMI 27.41 kg/m     Wt Readings from Last 3 Encounters:  10/06/22 225 lb 3.2 oz (102.2 kg)  07/19/22 224 lb 12.8 oz (102 kg)  07/01/21 229 lb 6.4 oz (104.1 kg)     GEN:  Well  nourished, well developed in no acute distress HEENT: Normal NECK: No JVD CARDIAC: RRR, no murmurs, rubs, gallops RESPIRATORY:  Clear to auscultation without rales, wheezing or rhonchi  ABDOMEN: Soft, non-tender, non-distended MUSCULOSKELETAL:  No edema; No deformity  SKIN: Warm and dry NEUROLOGIC:  Alert and oriented x 3 PSYCHIATRIC:  Normal affect   ASSESSMENT:    1. Hypertrophic cardiomyopathy   2. Palpitations    PLAN:    Hypertrophic Cardiomyopathy - Septal Variant - no LVO Gradient at rest - without MR, Apical Aneurysm  - NYHA I and very active  - Family history , Discussed family screening  (Has genetics visit; has strong familial component, son and daughter needs repeat imaging screening)  - will get POET after his trip to the [place];  - will get heart monitor after May 8th (palpitations during intercourse) - based on this will comment on his SCD risk; CMR in 2025  - start ambulatory BP monitoring - Based on this, absence of LVOT obstruction, and VANISH and INHERIT Trials, will start low dose valsartan   Disease state education done; he is a basketball player and we used Roxanne Mins as a scaffold to discuss care  One year f/u with me unless HTN remains notable ectopy.  Time Spent Directly with Patient:   I have spent a total of 60 minutes with the patient reviewing notes, imaging, EKGs, labs and examining the patient as well as establishing an assessment and plan that was discussed personally with the patient.  > 50% of time was spent in direct patient care.   Medication Adjustments/Labs and Tests Ordered: Current medicines are reviewed at length with the patient today.  Concerns regarding medicines are outlined above.  Orders Placed This Encounter  Procedures   Hypertrophic Cardiomyopathy (COHESION)   EXERCISE TOLERANCE TEST (ETT)   LONG TERM MONITOR (3-14 DAYS)   EKG 12-Lead   No orders of the defined types were placed in this  encounter.   Patient Instructions  Medication Instructions:  Your physician recommends that you continue on your current medications as directed. Please refer to the Current Medication list given to you today.  *If you need a refill on your cardiac medications before your next appointment, please call your pharmacy*   Lab Work: NONE If you have labs (blood work) drawn today and your tests are completely normal, you will receive your results only by: MyChart Message (if you have MyChart) OR A paper copy in the mail  If you have any lab test that is abnormal or we need to change your treatment, we will call you to review the results.   Testing/Procedures: Your physician has requested that you have an exercise tolerance test. For further information please visit [email or web address] Please also follow instruction sheet, as given.   Your physician has requested that you wear a Heart Monitor the second week in May.  Your physician has requested that you send in BP readings in May.    Follow-Up: At [facility], you and your health needs are our priority.  As part of our continuing mission to provide you with exceptional heart care, we have created designated Provider Care Teams.  These Care Teams include your primary Cardiologist (physician) and Advanced Practice Providers (APPs -  Physician Assistants and Nurse Practitioners) who all work together to provide you with the care you need, when you need it.   Your next appointment:   1 year(s)  Provider:   Riley Lam, MD  OTHER INSTRUCTIONS ZIO XT- Long Term Monitor Instructions  Your physician has requested you wear a ZIO patch monitor for 7 days.  This is a single patch monitor. [employer] supplies one patch monitor per enrollment. Additional stickers are not available. Please do not apply patch if you will be having a Nuclear Stress Test,   Cardiac CT, MRI, or Chest Xray during the period you would be wearing the   monitor. The patch cannot be worn during these tests. You cannot remove and re-apply the  ZIO XT patch monitor.  Your ZIO patch monitor will be mailed 3 day USPS to your address on file. It may take 3-5 days  to receive your monitor after you have been enrolled.  Once you have received your monitor, please review the enclosed instructions. Your monitor  has already been registered assigning a specific monitor serial # to you.  Billing and Patient Assistance Program Information  We have supplied [employer] with any of your insurance information on file for billing purposes. [employer] offers a sliding scale Patient Assistance Program for patients that do not have  insurance, or whose insurance does not completely cover the cost of the ZIO monitor.  You must apply for the Patient Assistance Program to qualify for this discounted rate.  To apply, please call [employer] at [phone], select option 4, select option 2, ask to apply for  Patient Assistance Program. Meredeth Ide will ask your household income, and how many people  are in your household. They will quote your out-of-pocket cost based on that information.  [employer] will also be able to set up a 79-month, interest-free payment plan if needed.  Applying the monitor   Shave hair from upper left chest.  Hold abrader disc by orange tab. Rub abrader in 40 strokes over the upper left chest as  indicated in your monitor instructions.  Clean area with 4 enclosed alcohol pads. Let dry.  Apply patch as indicated in monitor instructions. Patch will be placed under collarbone on left  side of chest with arrow pointing upward.  Rub patch adhesive wings for 2 minutes. Remove white label marked "1". Remove the white  label marked "2". Rub patch adhesive wings for 2 additional minutes.  While looking in a mirror, press and release button in center of patch. A small green light will  flash 3-4 times. This will be your only indicator that the monitor has been  turned on.  Do not shower for the first 24 hours. You may shower  after the first 24 hours.  Press the button if you feel a symptom. You will hear a small click. Record Date, Time and  Symptom in the Patient Logbook.  When you are ready to remove the patch, follow instructions on the last 2 pages of Patient  Logbook. Stick patch monitor onto the last page of Patient Logbook.  Place Patient Logbook in the blue and white box. Use locking tab on box and tape box closed  securely. The blue and white box has prepaid postage on it. Please place it in the mailbox as  soon as possible. Your physician should have your test results approximately 7 days after the  monitor has been mailed back to [facility].  Call [facility] Customer Care at [phone] if you have questions regarding  your ZIO XT patch monitor. Call them immediately if you see an orange light blinking on your  monitor.  If your monitor falls off in less than 4 days, contact our Monitor department at [phone].  If your monitor becomes loose or falls off after 4 days call [employer] at [phone] for  suggestions on securing your monitor       Signed, Christell Constant, MD  10/06/2022 3:49 PM    [facility]

## 2022-10-05 NOTE — Patient Instructions (Addendum)
What is a dermatofibroma? A dermatofibroma is a common benign fibrous nodule usually found on the skin of the lower legs.  A dermatofibroma is also called a cutaneous fibrous histiocytoma.  Who gets a dermatofibroma? Dermatofibromas are mostly seen in adults. People of every ethnicity can develop dermatofibromas. Ordinary dermatofibromas are more common in women than in men, although some histologic variants are more commonly identified in males.  What causes dermatofibroma? It is not clear if dermatofibroma is a reactive process or a true neoplasm. The lesions are composed of proliferating fibroblasts. Histiocytes may also be involved.  They are sometimes attributed to minor trauma including insect bites, injections, or a Georgiades thorn injury, but not consistently. Multiple dermatofibromas can develop in patients with altered immunity such as HIV, immunosuppression, or autoimmune conditions.  What are the clinical features of dermatofibroma? A dermatofibroma usually presents as a solitary firm papule or nodule on a limb.  A dermatofibroma can occur anywhere on the skin. Dermatofibroma size varies from 0.5-1.5 cm diameter; most lesions are 7-10 mm diameter. A dermatofibroma is tethered to the skin surface and mobile over subcutaneous tissue. The overlying skin dimples on pinching the lesion - the dimple or pinch sign. Colour may be pink to light brown in white skin, and dark brown to black in dark skin; some appear paler in the centre. Dermatofibromas do not usually cause symptoms, but they are sometimes painful, tender, or itchy. Clinical variants include giant, eruptive, and multiple forms.   Dermatofibroma pinch sign Pinch sign of dermatofibroma  Dermatofibroma Dermoscopy of dermatofibroma  See more images of dermatofibroma.  What are the complications of dermatofibroma? Because dermatofibromas are often raised lesions, they may be traumatised, for example by a  razor.  Occasionally dozens may erupt within a few months, usually in the setting of immunosuppression.  Dermatofibroma does not give rise to cancer. However, occasionally, it may be mistaken for dermatofibrosarcoma protuberans or desmoplastic melanoma.  How is a dermatofibroma diagnosed? Dermatofibroma is usually easy to diagnose clinically, supported by dermoscopy. The most common dermoscopic pattern is a central white area surrounded by a faint pigment network. However different patterns may be seen in skin of colour.  Diagnostic excision or skin biopsy is undertaken if there is an atypical feature such as recent enlargement, ulceration, or asymmetrical structures and colours on dermoscopy.  The pathology of dermatofibroma shows whirling fascicles of spindle cell proliferation with excessive collagen deposition in the dermis. There are many pathological variants including:  cellular aneurysmal epithelioid atypical lipidized ankle-type palisading cholesterotic In case of doubt, immunohistochemical staining is used to confirm the diagnosis.  What is the treatment for dermatofibroma? A dermatofibroma is harmless and seldom causes any symptoms. Usually, only reassurance is needed. If it is nuisance or causing concern, the lesion can be removed surgically. Recurrence is common as the lesion often extends beyond the clinical margin.  Cryotherapy, shave biopsy and laser treatments are rarely completely successful.  Due to recent changes in healthcare laws, you may see results of your pathology and/or laboratory studies on MyChart before the doctors have had a chance to review them. We understand that in some cases there may be results that are confusing or concerning to you. Please understand that not all results are received at the same time and often the doctors may need to interpret multiple results in order to provide you with the best plan of care or course of treatment. Therefore, we ask  that you please give [place] 2 business days to thoroughly review  all your results before contacting the office for clarification. Should we see a critical lab result, you will be contacted sooner.   If You Need Anything After Your Visit  If you have any questions or concerns for your doctor, please call our main line at [phone] If no one answers, please leave a voicemail as directed and we will return your call as soon as possible. Messages left after 4 pm will be answered the following business day.   You may also send [place] a message via MyChart. We typically respond to MyChart messages within 1-2 business days.  For prescription refills, please ask your pharmacy to contact our office. Our fax number is [phone].  If you have an urgent issue when the clinic is closed that cannot wait until the next business day, you can page your doctor at the number below.    Please note that while we do our best to be available for urgent issues outside of office hours, we are not available 24/7.   If you have an urgent issue and are unable to reach [place], you may choose to seek medical care at your doctor's office, retail clinic, urgent care center, or emergency room.  If you have a medical emergency, please immediately call 911 or go to the emergency department. In the event of inclement weather, please call our main line at [phone] for an update on the status of any delays or closures.  Dermatology Medication Tips: Please keep the boxes that topical medications come in in order to help keep track of the instructions about where and how to use these. Pharmacies typically print the medication instructions only on the boxes and not directly on the medication tubes.   If your medication is too expensive, please contact our office at [phone] or send [place] a message through MyChart.   We are unable to tell what your co-pay for medications will be in advance as this is different depending on your  insurance coverage. However, we may be able to find a substitute medication at lower cost or fill out paperwork to get insurance to cover a needed medication.   If a prior authorization is required to get your medication covered by your insurance company, please allow [place] 1-2 business days to complete this process.  Drug prices often vary depending on where the prescription is filled and some pharmacies may offer cheaper prices.  The website www.goodrx.com contains coupons for medications through different pharmacies. The prices here do not account for what the cost may be with help from insurance (it may be cheaper with your insurance), but the website can give you the price if you did not use any insurance.  - You can print the associated coupon and take it with your prescription to the pharmacy.  - You may also stop by our office during regular business hours and pick up a GoodRx coupon card.  - If you need your prescription sent electronically to a different pharmacy, notify our office through [facility] or by phone at [phone]

## 2022-10-06 ENCOUNTER — Ambulatory Visit (INDEPENDENT_AMBULATORY_CARE_PROVIDER_SITE_OTHER): Payer: BLUE CROSS/BLUE SHIELD

## 2022-10-06 ENCOUNTER — Ambulatory Visit: Payer: BLUE CROSS/BLUE SHIELD | Admitting: Genetic Counselor

## 2022-10-06 ENCOUNTER — Ambulatory Visit: Payer: BLUE CROSS/BLUE SHIELD | Attending: Internal Medicine | Admitting: Internal Medicine

## 2022-10-06 ENCOUNTER — Encounter: Payer: Self-pay | Admitting: Internal Medicine

## 2022-10-06 VITALS — BP 146/92 | HR 72 | Ht 76.0 in | Wt 225.2 lb

## 2022-10-06 DIAGNOSIS — I422 Other hypertrophic cardiomyopathy: Secondary | ICD-10-CM | POA: Diagnosis not present

## 2022-10-06 DIAGNOSIS — R002 Palpitations: Secondary | ICD-10-CM

## 2022-10-06 NOTE — Progress Notes (Unsigned)
Enrolled patient for a 7 day ZIo XT monitor to be mailed to patients home  °

## 2022-10-06 NOTE — Patient Instructions (Signed)
Medication Instructions:  Your physician recommends that you continue on your current medications as directed. Please refer to the Current Medication list given to you today.  *If you need a refill on your cardiac medications before your next appointment, please call your pharmacy*   Lab Work: NONE If you have labs (blood work) drawn today and your tests are completely normal, you will receive your results only by: MyChart Message (if you have MyChart) OR A paper copy in the mail If you have any lab test that is abnormal or we need to change your treatment, we will call you to review the results.   Testing/Procedures: Your physician has requested that you have an exercise tolerance test. For further information please visit [email or web address] Please also follow instruction sheet, as given.   Your physician has requested that you wear a Heart Monitor the second week in May.  Your physician has requested that you send in BP readings in May.    Follow-Up: At [facility], you and your health needs are our priority.  As part of our continuing mission to provide you with exceptional heart care, we have created designated Provider Care Teams.  These Care Teams include your primary Cardiologist (physician) and Advanced Practice Providers (APPs -  Physician Assistants and Nurse Practitioners) who all work together to provide you with the care you need, when you need it.   Your next appointment:   1 year(s)  Provider:   Riley Lam, MD  OTHER INSTRUCTIONS ZIO XT- Long Term Monitor Instructions  Your physician has requested you wear a ZIO patch monitor for 7 days.  This is a single patch monitor. [employer] supplies one patch monitor per enrollment. Additional stickers are not available. Please do not apply patch if you will be having a Nuclear Stress Test,   Cardiac CT, MRI, or Chest Xray during the period you would be wearing the  monitor. The patch cannot be worn  during these tests. You cannot remove and re-apply the  ZIO XT patch monitor.  Your ZIO patch monitor will be mailed 3 day USPS to your address on file. It may take 3-5 days  to receive your monitor after you have been enrolled.  Once you have received your monitor, please review the enclosed instructions. Your monitor  has already been registered assigning a specific monitor serial # to you.  Billing and Patient Assistance Program Information  We have supplied [employer] with any of your insurance information on file for billing purposes. [employer] offers a sliding scale Patient Assistance Program for patients that do not have  insurance, or whose insurance does not completely cover the cost of the ZIO monitor.  You must apply for the Patient Assistance Program to qualify for this discounted rate.  To apply, please call [employer] at [phone], select option 4, select option 2, ask to apply for  Patient Assistance Program. Meredeth Ide will ask your household income, and how many people  are in your household. They will quote your out-of-pocket cost based on that information.  [employer] will also be able to set up a 49-month, interest-free payment plan if needed.  Applying the monitor   Shave hair from upper left chest.  Hold abrader disc by orange tab. Rub abrader in 40 strokes over the upper left chest as  indicated in your monitor instructions.  Clean area with 4 enclosed alcohol pads. Let dry.  Apply patch as indicated in monitor instructions. Patch will be placed under collarbone on left  side  of chest with arrow pointing upward.  Rub patch adhesive wings for 2 minutes. Remove white label marked "1". Remove the white  label marked "2". Rub patch adhesive wings for 2 additional minutes.  While looking in a mirror, press and release button in center of patch. A small green light will  flash 3-4 times. This will be your only indicator that the monitor has been turned on.  Do not shower for the  first 24 hours. You may shower after the first 24 hours.  Press the button if you feel a symptom. You will hear a small click. Record Date, Time and  Symptom in the Patient Logbook.  When you are ready to remove the patch, follow instructions on the last 2 pages of Patient  Logbook. Stick patch monitor onto the last page of Patient Logbook.  Place Patient Logbook in the blue and white box. Use locking tab on box and tape box closed  securely. The blue and white box has prepaid postage on it. Please place it in the mailbox as  soon as possible. Your physician should have your test results approximately 7 days after the  monitor has been mailed back to [facility].  Call [facility] Customer Care at [phone] if you have questions regarding  your ZIO XT patch monitor. Call them immediately if you see an orange light blinking on your  monitor.  If your monitor falls off in less than 4 days, contact our Monitor department at [phone].  If your monitor becomes loose or falls off after 4 days call [employer] at [phone] for  suggestions on securing your monitor

## 2022-10-09 NOTE — Progress Notes (Signed)
Pre-test Genetic Consult  Douglas Harper had a genetic consult with me previously in 2020 for family history of HCM. He subsequently underwent a cardiac MRI that detected basal septal hypertrophy of 1.5 cm with LVEF of 71% and scar at RV insertion sites.   He continues to work at [employer] and has a great attitude towards life. He would like to pursue genetic testing considering the significant family history of HCM in his mother and maternal aunt. He tells me that most of his siblings have undergone HCM screening with none yet reported  to have cardiac wall thickness indicative of HCM.  His 53 y.o daughter and 53 y.o son have not yet been screened for HCM. He is aware that they need regular cardiac screening by echocardiogram and EKG.   Plan After a thorough discussion of the risk and benefits of genetic testing for HCM, Douglas Harper states his intent to pursue genetic testing for HCM and signed the informed consent form. Blood was drawn today for testing.    Douglas Harper, Ph.D, [facility] Clinical Molecular Geneticist

## 2022-10-10 ENCOUNTER — Encounter: Payer: Self-pay | Admitting: Dermatology

## 2022-10-26 DIAGNOSIS — R002 Palpitations: Secondary | ICD-10-CM

## 2022-10-26 DIAGNOSIS — I422 Other hypertrophic cardiomyopathy: Secondary | ICD-10-CM | POA: Diagnosis not present

## 2022-10-31 ENCOUNTER — Encounter: Payer: Self-pay | Admitting: Family Medicine

## 2022-10-31 DIAGNOSIS — E890 Postprocedural hypothyroidism: Secondary | ICD-10-CM

## 2022-11-02 MED ORDER — LEVOTHYROXINE SODIUM 125 MCG PO TABS: ORAL_TABLET | ORAL | 1 refills | Status: DC

## 2022-12-20 ENCOUNTER — Ambulatory Visit
Admission: EM | Admit: 2022-12-20 | Discharge: 2022-12-20 | Disposition: A | Discharge status: Home / Self Care | Payer: BLUE CROSS/BLUE SHIELD | Attending: Internal Medicine | Admitting: Internal Medicine

## 2022-12-20 DIAGNOSIS — J3489 Other specified disorders of nose and nasal sinuses: Secondary | ICD-10-CM | POA: Diagnosis not present

## 2022-12-20 DIAGNOSIS — J029 Acute pharyngitis, unspecified: Secondary | ICD-10-CM

## 2022-12-20 DIAGNOSIS — R051 Acute cough: Secondary | ICD-10-CM

## 2022-12-20 LAB — POCT RAPID STREP A (OFFICE): Rapid Strep A Screen: NEGATIVE

## 2022-12-20 MED ORDER — BENZONATATE 100 MG PO CAPS: 100.0000 mg | ORAL_CAPSULE | Freq: Three times a day (TID) | ORAL | 0 refills | Status: DC | PRN

## 2022-12-20 MED ORDER — FLUTICASONE PROPIONATE 50 MCG/ACT NA SUSP: 1.0000 | Freq: Every day | NASAL | 0 refills | Status: DC

## 2022-12-20 NOTE — ED Triage Notes (Signed)
Pt c/o cough, sore throat, congestion, and post nasal drip since Saturday. States took mucinex and gargling with salt water with little relief.

## 2022-12-20 NOTE — ED Provider Notes (Signed)
[name]    CSN: [id number] Arrival date & time: 12/20/22  0806      History   Chief Complaint Chief Complaint  Patient presents with   Cough   Sore Throat    HPI Douglas Harper is a 52 y.o. male.   Patient presents with cough, sore throat, nasal drainage that started about 5 days ago.  Patient reports he has taken Mucinex and TheraFlu with temporary improvement in symptoms.  Denies any fever or known sick contacts.  Reports feelings of chest congestion but denies chest pain or shortness of breath.  Denies history of asthma or COPD and patient does not smoke cigarettes. Patient states that he took a covid test at home that was negative.    Cough Sore Throat    Past Medical History:  Diagnosis Date   COVID-19    03/17/20   FH: heart disease    GERD (gastroesophageal reflux disease)    Glaucoma    Hyperthyroidism    s/p RAI 2009    Hypothyroidism    Verruca vulgaris     Patient Active Problem List   Diagnosis Date Noted   Bilateral sciatica 07/01/2021   Hamstring injury 07/01/2021   Left hip pain 05/23/2021   Degenerative disc disease, lumbar 05/23/2021   Acute right-sided low back pain without sciatica 05/18/2021   Spasm of lumbar paraspinous muscle 05/18/2021   Microcytosis 10/10/2019   Lymphadenopathy, supraclavicular 10/10/2019   Abnormal echocardiogram 04/10/2019   Encounter for routine adult health examination with abnormal findings 03/27/2019   Hypertrophic cardiomyopathy (HCC) 03/27/2019   Vitamin D deficiency 03/27/2019   Abnormal EKG 03/27/2019   Hypothyroidism    Hyperthyroidism    GERD (gastroesophageal reflux disease)    FH: heart disease     Past Surgical History:  Procedure Laterality Date   ANTERIOR CRUCIATE LIGAMENT REPAIR Right 2000   HAND SURGERY Right 2001   with hardware implanted   NO PAST SURGERIES         Home Medications    Prior to Admission medications   Medication Sig Start Date End Date Taking?  Authorizing Provider  benzonatate (TESSALON) 100 MG capsule Take 1 capsule (100 mg total) by mouth every 8 (eight) hours as needed for cough. 12/20/22  Yes Sebastien Jackson, Rolly Salter E, FNP  fluticasone (FLONASE) 50 MCG/ACT nasal spray Place 1 spray into both nostrils daily. 12/20/22  Yes Dink Creps, Rolly Salter E, FNP  levothyroxine (SYNTHROID) 125 MCG tablet TAKE 1 TABLET BY MOUTH 30 MINUTES BEFORE BREAKFAST (STOP DOSE) 11/02/22 11/02/23  Deeann Saint, MD  Vitamin D, Ergocalciferol, (DRISDOL) 1.25 MG (50000 UNIT) CAPS capsule Take 1 capsule (50,000 Units total) by mouth every 7 (seven) days. 07/28/22   Deeann Saint, MD    Family History Family History  Problem Relation Age of Onset   Heart disease Mother    Hypertension Mother    Diabetes Mother    Colon cancer Neg Hx    Esophageal cancer Neg Hx    Pancreatic cancer Neg Hx    Liver disease Neg Hx    Rectal cancer Neg Hx    Stomach cancer Neg Hx     Social History Social History   Tobacco Use   Smoking status: Never   Smokeless tobacco: Never  Substance Use Topics   Alcohol use: Yes    Comment: socially   Drug use: Never     Allergies   Patient has no known allergies.   Review of Systems Review  of Systems Per HPI  Physical Exam Triage Vital Signs ED Triage Vitals [12/20/22 0816]  Enc Vitals Group     BP (!) 156/97     Pulse Rate 68     Resp 18     Temp 97.9 F (36.6 C)     Temp Source Oral     SpO2 96 %     Weight      Height      Head Circumference      Peak Flow      Pain Score 7     Pain Loc      Pain Edu?      Excl. in GC?    No data found.  Updated Vital Signs BP (!) 156/97 (BP Location: Left Arm)   Pulse 68   Temp 97.9 F (36.6 C) (Oral)   Resp 18   SpO2 96%   Visual Acuity Right Eye Distance:   Left Eye Distance:   Bilateral Distance:    Right Eye Near:   Left Eye Near:    Bilateral Near:     Physical Exam Constitutional:      General: He is not in acute distress.    Appearance: Normal  appearance. He is not toxic-appearing or diaphoretic.  HENT:     Head: Normocephalic and atraumatic.     Right Ear: Ear canal normal. A middle ear effusion is present. Tympanic membrane is not perforated, erythematous or bulging.     Left Ear: Ear canal normal. A middle ear effusion is present. Tympanic membrane is not perforated, erythematous or bulging.     Nose: Congestion present.     Mouth/Throat:     Mouth: Mucous membranes are moist.     Pharynx: Posterior oropharyngeal erythema present.  Eyes:     Extraocular Movements: Extraocular movements intact.     Conjunctiva/sclera: Conjunctivae normal.     Pupils: Pupils are equal, round, and reactive to light.  Cardiovascular:     Rate and Rhythm: Normal rate and regular rhythm.     Pulses: Normal pulses.     Heart sounds: Normal heart sounds.  Pulmonary:     Effort: Pulmonary effort is normal. No respiratory distress.     Breath sounds: Normal breath sounds. No stridor. No wheezing, rhonchi or rales.  Abdominal:     General: Abdomen is flat. Bowel sounds are normal.     Palpations: Abdomen is soft.  Musculoskeletal:        General: Normal range of motion.     Cervical back: Normal range of motion.  Skin:    General: Skin is warm and dry.  Neurological:     General: No focal deficit present.     Mental Status: He is alert and oriented to person, place, and time. Mental status is at baseline.  Psychiatric:        Mood and Affect: Mood normal.        Behavior: Behavior normal.      UC Treatments / Results  Labs (all labs ordered are listed, but only abnormal results are displayed) Labs Reviewed  CULTURE, GROUP A STREP [facility])  POCT RAPID STREP A (OFFICE)    EKG   Radiology No results found.  Procedures Procedures (including critical care time)  Medications Ordered in UC Medications - No data to display  Initial Impression / Assessment and Plan / UC Course  I have reviewed the triage vital signs and the nursing  notes.  Pertinent labs & imaging results  that were available during my care of the patient were reviewed by me and considered in my medical decision making (see chart for details).     Patient presents with symptoms likely from a viral upper respiratory infection. Differential includes bacterial pneumonia, sinusitis, allergic rhinitis. Do not suspect underlying cardiopulmonary process. Symptoms seem unlikely related to ACS, CHF or COPD exacerbations, pneumonia, pneumothorax. Patient is nontoxic appearing and not in need of emergent medical intervention. Rapid strep was negative.   Recommended symptom control with medication and supportive care. Patient was sent benzonatate and flonase and encouraged to use cetirizine that he has at home.   Return if symptoms fail to improve in 1-2 weeks or you develop shortness of breath, chest pain, severe headache. Patient states understanding and is agreeable.  Discharged with PCP followup.  Final Clinical Impressions(s) / UC Diagnoses   Final diagnoses:  Sore throat  Acute cough  Nasal drainage     Discharge Instructions      Rapid strep test was negative.  Throat culture is pending.  We will call if it is abnormal.  As we discussed, the symptoms should run their course with the help of symptomatic treatment.  I have prescribed you 2 medications to help alleviate your symptoms.  Follow-up if any symptoms persist or worsen.     ED Prescriptions     Medication Sig Dispense Auth. Provider   fluticasone (FLONASE) 50 MCG/ACT nasal spray Place 1 spray into both nostrils daily. 16 g Pascal Stiggers, Rolly Salter E, [place]   benzonatate (TESSALON) 100 MG capsule Take 1 capsule (100 mg total) by mouth every 8 (eight) hours as needed for cough. 21 capsule [place], Acie Fredrickson, [place]      PDMP not reviewed this encounter.   Gustavus Bryant, [place] 12/20/22 [id number]

## 2022-12-20 NOTE — Discharge Instructions (Signed)
Rapid strep test was negative.  Throat culture is pending.  We will call if it is abnormal.  As we discussed, the symptoms should run their course with the help of symptomatic treatment.  I have prescribed you 2 medications to help alleviate your symptoms.  Follow-up if any symptoms persist or worsen.

## 2022-12-21 LAB — CULTURE, GROUP A STREP (THRC)

## 2022-12-22 LAB — CULTURE, GROUP A STREP (THRC)

## 2023-02-01 ENCOUNTER — Telehealth: Payer: Self-pay | Admitting: Internal Medicine

## 2023-02-01 NOTE — Telephone Encounter (Signed)
Called patient to schedule his GXT that [facility] had ordered and patient told me he could not afford this test as it is not covered under his insurance. He wanted me to cancel the order for now. Please advise on next steps.

## 2023-03-09 ENCOUNTER — Encounter: Payer: Self-pay | Admitting: Internal Medicine

## 2023-04-22 ENCOUNTER — Ambulatory Visit
Admission: EM | Admit: 2023-04-22 | Discharge: 2023-04-22 | Disposition: A | Discharge status: Home / Self Care | Payer: BLUE CROSS/BLUE SHIELD | Attending: Internal Medicine | Admitting: Internal Medicine

## 2023-04-22 ENCOUNTER — Ambulatory Visit: Payer: BLUE CROSS/BLUE SHIELD

## 2023-04-22 DIAGNOSIS — M79672 Pain in left foot: Secondary | ICD-10-CM | POA: Diagnosis not present

## 2023-04-22 NOTE — ED Provider Notes (Signed)
[name]    CSN: [id number] Arrival date & time: 04/22/23  0802      History   Chief Complaint Chief Complaint  Patient presents with   Foot Pain    HPI Douglas Harper is a 52 y.o. male.   Patient presents with left foot pain located around the heel that started a few weeks ago but is worsened over the past few days.  Reports that he was playing in a basketball tournament where he noticed that it worsened.  Denies any obvious injury.  Denies numbness or tingling.  Patient is able to bear weight.  He has not taken any medication for pain.   Foot Pain    Past Medical History:  Diagnosis Date   COVID-19    03/17/20   FH: heart disease    GERD (gastroesophageal reflux disease)    Glaucoma    Hyperthyroidism    s/p RAI 2009    Hypothyroidism    Verruca vulgaris     Patient Active Problem List   Diagnosis Date Noted   Bilateral sciatica 07/01/2021   Hamstring injury 07/01/2021   Left hip pain 05/23/2021   Degenerative disc disease, lumbar 05/23/2021   Acute right-sided low back pain without sciatica 05/18/2021   Spasm of lumbar paraspinous muscle 05/18/2021   Microcytosis 10/10/2019   Lymphadenopathy, supraclavicular 10/10/2019   Abnormal echocardiogram 04/10/2019   Encounter for routine adult health examination with abnormal findings 03/27/2019   Hypertrophic cardiomyopathy (HCC) 03/27/2019   Vitamin D deficiency 03/27/2019   Abnormal EKG 03/27/2019   Hypothyroidism    Hyperthyroidism    GERD (gastroesophageal reflux disease)    FH: heart disease     Past Surgical History:  Procedure Laterality Date   ANTERIOR CRUCIATE LIGAMENT REPAIR Right 2000   HAND SURGERY Right 2001   with hardware implanted   NO PAST SURGERIES         Home Medications    Prior to Admission medications   Medication Sig Start Date End Date Taking? Authorizing Provider  benzonatate (TESSALON) 100 MG capsule Take 1 capsule (100 mg total) by mouth every 8 (eight)  hours as needed for cough. 12/20/22   Gustavus Bryant, FNP  fluticasone (FLONASE) 50 MCG/ACT nasal spray Place 1 spray into both nostrils daily. 12/20/22   Gustavus Bryant, FNP  levothyroxine (SYNTHROID) 125 MCG tablet TAKE 1 TABLET BY MOUTH 30 MINUTES BEFORE BREAKFAST (STOP DOSE) 11/02/22 11/02/23  Deeann Saint, MD  Vitamin D, Ergocalciferol, (DRISDOL) 1.25 MG (50000 UNIT) CAPS capsule Take 1 capsule (50,000 Units total) by mouth every 7 (seven) days. 07/28/22   Deeann Saint, MD    Family History Family History  Problem Relation Age of Onset   Heart disease Mother    Hypertension Mother    Diabetes Mother    Colon cancer Neg Hx    Esophageal cancer Neg Hx    Pancreatic cancer Neg Hx    Liver disease Neg Hx    Rectal cancer Neg Hx    Stomach cancer Neg Hx     Social History Social History   Tobacco Use   Smoking status: Never   Smokeless tobacco: Never  Substance Use Topics   Alcohol use: Yes    Comment: socially   Drug use: Never     Allergies   Patient has no known allergies.   Review of Systems Review of Systems Per HPI  Physical Exam Triage Vital Signs ED Triage Vitals  Encounter  Vitals Group     BP 04/22/23 0817 (!) 145/91     Systolic BP Percentile --      Diastolic BP Percentile --      Pulse Rate 04/22/23 0817 69     Resp 04/22/23 0817 18     Temp 04/22/23 0817 98.2 F (36.8 C)     Temp Source 04/22/23 0817 Oral     SpO2 04/22/23 0817 97 %     Weight 04/22/23 0816 212 lb (96.2 kg)     Height 04/22/23 0816 6\' 4"  (1.93 m)     Head Circumference --      Peak Flow --      Pain Score 04/22/23 0816 9     Pain Loc --      Pain Education --      Exclude from Growth Chart --    No data found.  Updated Vital Signs BP (!) 145/91 (BP Location: Left Arm)   Pulse 69   Temp 98.2 F (36.8 C) (Oral)   Resp 18   Ht 6\' 4"  (1.93 m)   Wt 212 lb (96.2 kg)   SpO2 97%   BMI 25.81 kg/m   Visual Acuity Right Eye Distance:   Left Eye Distance:    Bilateral Distance:    Right Eye Near:   Left Eye Near:    Bilateral Near:     Physical Exam Constitutional:      General: He is not in acute distress.    Appearance: Normal appearance. He is not toxic-appearing or diaphoretic.  HENT:     Head: Normocephalic and atraumatic.  Eyes:     Extraocular Movements: Extraocular movements intact.     Conjunctiva/sclera: Conjunctivae normal.  Pulmonary:     Effort: Pulmonary effort is normal.  Musculoskeletal:       Feet:  Feet:     Comments: Patient reports tenderness to palpation to medial portion of left foot around heel.  No swelling, discoloration, lacerations, abrasions noted.  No other tenderness to foot or ankle.  Patient can wiggle toes.  Capillary refill and pulses intact. Neurological:     General: No focal deficit present.     Mental Status: He is alert and oriented to person, place, and time. Mental status is at baseline.  Psychiatric:        Mood and Affect: Mood normal.        Behavior: Behavior normal.        Thought Content: Thought content normal.        Judgment: Judgment normal.      UC Treatments / Results  Labs (all labs ordered are listed, but only abnormal results are displayed) Labs Reviewed - No data to display  EKG   Radiology DG Foot Complete Left  Result Date: 04/22/2023 CLINICAL DATA:  Left heel pain after playing basketball. EXAM: LEFT FOOT - COMPLETE 3+ VIEW COMPARISON:  None Available. FINDINGS: The mineralization and alignment are normal. There is no evidence of acute fracture or dislocation. There are mild degenerative changes at the 1st metatarsophalangeal joint and within the midfoot. Prominent vascular calcifications are noted. The soft tissues otherwise appear unremarkable. IMPRESSION: No evidence of acute fracture or dislocation. Mild degenerative changes. Electronically Signed   By: Carey Bullocks M.D.   On: 04/22/2023 09:02    Procedures Procedures (including critical care  time)  Medications Ordered in UC Medications - No data to display  Initial Impression / Assessment and Plan / UC Course  I have  reviewed the triage vital signs and the nursing notes.  Pertinent labs & imaging results that were available during my care of the patient were reviewed by me and considered in my medical decision making (see chart for details).     Foot x-ray negative for any acute bony abnormality.  It did show degenerative changes which could be contributing to pain.  Although, given physical exam I am more suspicious of muscular etiology such as some form of tendinitis.  Discussed x-ray results with patient.  Advised supportive care including elevation and safe over-the-counter pain relievers.  Advised strict follow-up with podiatry at provided contact if symptoms persist or worsen.  Patient verbalized understanding and was agreeable with plan. Final Clinical Impressions(s) / UC Diagnoses   Final diagnoses:  None     Discharge Instructions      Your foot x-ray is pending.  We will call if it is abnormal.  Please take ibuprofen or Tylenol as needed.  Follow-up with foot doctor if symptoms persist or worsen.    ED Prescriptions   None    PDMP not reviewed this encounter.   Gustavus Bryant, [place] 04/22/23 1101

## 2023-04-22 NOTE — ED Triage Notes (Addendum)
Patient presents with left heel pain, states he was playing in a basketball tournament and pain started. Sharp pain when he step on it. Used Icy hot last night.

## 2023-04-22 NOTE — Discharge Instructions (Signed)
Your foot x-ray is pending.  We will call if it is abnormal.  Please take ibuprofen or Tylenol as needed.  Follow-up with foot doctor if symptoms persist or worsen.

## 2023-04-23 ENCOUNTER — Encounter: Payer: Self-pay | Admitting: Podiatry

## 2023-04-23 ENCOUNTER — Ambulatory Visit: Payer: BLUE CROSS/BLUE SHIELD | Admitting: Podiatry

## 2023-04-23 DIAGNOSIS — S93692A Other sprain of left foot, initial encounter: Secondary | ICD-10-CM

## 2023-04-23 MED ORDER — TRIAMCINOLONE ACETONIDE 10 MG/ML IJ SUSP
2.5000 mg | Freq: Once | INTRAMUSCULAR | Status: AC
Start: 2023-04-23 — End: 2023-04-23
  Administered 2023-04-23: 2.5 mg via INTRA_ARTICULAR

## 2023-04-23 MED ORDER — DEXAMETHASONE SODIUM PHOSPHATE 120 MG/30ML IJ SOLN
4.0000 mg | Freq: Once | INTRAMUSCULAR | Status: AC
Start: 2023-04-23 — End: 2023-04-23
  Administered 2023-04-23: 4 mg via INTRA_ARTICULAR

## 2023-04-23 NOTE — Progress Notes (Signed)
  Subjective:  Patient ID: Douglas Harper, male    DOB: Mar 26, 1971,   MRN: [id number]  Chief Complaint  Patient presents with   Foot Pain    Pt presents for pain in the left heel pt stated he injured his foot playing basketball over the weekend.    52 y.o. male presents for concern of left heel injury that occurred over the weekend. Relates he was playing basketball and felt a pull and intense pain on the bottom of his foot. Has had pain since. Was seen in urgent care and X-rays taken and told to follow-up here.    . Denies any other pedal complaints. Denies n/v/f/c.   Past Medical History:  Diagnosis Date   COVID-19    03/17/20   FH: heart disease    GERD (gastroesophageal reflux disease)    Glaucoma    Hyperthyroidism    s/p RAI 2009    Hypothyroidism    Verruca vulgaris     Objective:  Physical Exam: Vascular: DP/PT pulses 2/4 bilateral. CFT <3 seconds. Normal hair growth on digits. No edema.  Skin. No lacerations or abrasions bilateral feet.  Musculoskeletal: MMT 5/5 bilateral lower extremities in DF, PF, Inversion and Eversion. Deceased ROM in DF of ankle joint. Very tender to the medial calcaneal tubercle left . No pain with achilles, PT or arch. No pain with calcaneal squeeze.  Neurological: Sensation intact to light touch.   Assessment:   1. Rupture of plantar fascia, left, initial encounter      Plan:  Patient was evaluated and treated and all questions answered. -Xrays reviewed. No acute fractures or dislocations.  -Discussed treatement options for possible plantar fascia rupture ; risks, alternatives, and benefits explained. -Dispensed CAM boot.  Patient to wear at all times and instructed on use -Recommend protection, rest, ice, elevation daily until symptoms improve -Rx pain med/antinflammatories as needed -Requesting injection and provided procedure below.  -Patient to return to office in 4 weeks to assess healing  or sooner if condition worsens.   Procedure:  Injection Tendon/Ligament Discussed alternatives, risks, complications and verbal consent was obtained.  Location: Left plantar fascia . Skin Prep: Alcohol. Injectate: 1cc 0.5% marcaine plain, 1 cc dexamethasone 0.5 cc kenalog  Disposition: Patient tolerated procedure well. Injection site dressed with a band-aid.  Post-injection care was discussed and return precautions discussed.    Louann Sjogren, DPM

## 2023-05-03 ENCOUNTER — Other Ambulatory Visit: Payer: Self-pay | Admitting: Family Medicine

## 2023-05-03 DIAGNOSIS — E890 Postprocedural hypothyroidism: Secondary | ICD-10-CM

## 2023-05-23 ENCOUNTER — Ambulatory Visit: Payer: BLUE CROSS/BLUE SHIELD | Admitting: Podiatry

## 2023-05-23 ENCOUNTER — Encounter: Payer: Self-pay | Admitting: Podiatry

## 2023-05-23 DIAGNOSIS — S93699D Other sprain of unspecified foot, subsequent encounter: Secondary | ICD-10-CM

## 2023-05-23 NOTE — Progress Notes (Signed)
  Subjective:  Patient ID: Douglas Harper, male    DOB: [birth year],   MRN: [id number]  No chief complaint on file.   52 y.o. male presents for follow-up of left plantar fascia rupture. Relates doing well. Only has some pain in one area but overall doing much better. Did stop wearing the CAM boot yesterday and doing well in regular shoes.   . Denies any other pedal complaints. Denies n/v/f/c.   Past Medical History:  Diagnosis Date   COVID-19    03/17/20   FH: heart disease    GERD (gastroesophageal reflux disease)    Glaucoma    Hyperthyroidism    s/p RAI 2009    Hypothyroidism    Verruca vulgaris     Objective:  Physical Exam: Vascular: DP/PT pulses 2/4 bilateral. CFT <3 seconds. Normal hair growth on digits. No edema.  Skin. No lacerations or abrasions bilateral feet.  Musculoskeletal: MMT 5/5 bilateral lower extremities in DF, PF, Inversion and Eversion. Deceased ROM in DF of ankle joint. Minimally  tender to the medial calcaneal tubercle left . No pain with achilles, PT or arch. No pain with calcaneal squeeze.  Neurological: Sensation intact to light touch.   Assessment:   1. Rupture of plantar fascia, subsequent encounter       Plan:  Patient was evaluated and treated and all questions answered. -Xrays reviewed. No acute fractures or dislocations.  -Discussed treatement options for possible plantar fascia rupture ; risks, alternatives, and benefits explained. -Discussed stretching and exercises provided.  -Discussed supportive shoes and inserts.  Return in future if pain does not improve or any worsening.       Louann Sjogren, DPM

## 2023-05-23 NOTE — Patient Instructions (Signed)

## 2023-11-04 ENCOUNTER — Other Ambulatory Visit: Payer: Self-pay | Admitting: Family Medicine

## 2023-11-04 DIAGNOSIS — E890 Postprocedural hypothyroidism: Secondary | ICD-10-CM

## 2023-12-10 ENCOUNTER — Other Ambulatory Visit: Payer: Self-pay | Admitting: Family Medicine

## 2023-12-10 DIAGNOSIS — E890 Postprocedural hypothyroidism: Secondary | ICD-10-CM

## 2023-12-14 ENCOUNTER — Other Ambulatory Visit: Payer: Self-pay | Admitting: Family Medicine

## 2023-12-14 DIAGNOSIS — E890 Postprocedural hypothyroidism: Secondary | ICD-10-CM

## 2024-03-06 ENCOUNTER — Encounter: Payer: Self-pay | Admitting: Gastroenterology

## 2024-03-09 ENCOUNTER — Emergency Department (HOSPITAL_COMMUNITY)

## 2024-03-09 ENCOUNTER — Inpatient Hospital Stay (HOSPITAL_COMMUNITY)
Admission: EM | Admit: 2024-03-09 | Discharge: 2024-03-12 | DRG: 435 | Disposition: A | Discharge status: Home / Self Care | Attending: Internal Medicine | Admitting: Internal Medicine

## 2024-03-09 ENCOUNTER — Other Ambulatory Visit: Payer: Self-pay

## 2024-03-09 ENCOUNTER — Encounter (HOSPITAL_COMMUNITY): Payer: Self-pay

## 2024-03-09 DIAGNOSIS — E039 Hypothyroidism, unspecified: Secondary | ICD-10-CM | POA: Diagnosis present

## 2024-03-09 DIAGNOSIS — E876 Hypokalemia: Secondary | ICD-10-CM | POA: Diagnosis present

## 2024-03-09 DIAGNOSIS — I10 Essential (primary) hypertension: Secondary | ICD-10-CM | POA: Diagnosis present

## 2024-03-09 DIAGNOSIS — Z8249 Family history of ischemic heart disease and other diseases of the circulatory system: Secondary | ICD-10-CM | POA: Diagnosis not present

## 2024-03-09 DIAGNOSIS — Z79890 Hormone replacement therapy (postmenopausal): Secondary | ICD-10-CM | POA: Diagnosis not present

## 2024-03-09 DIAGNOSIS — K8689 Other specified diseases of pancreas: Secondary | ICD-10-CM | POA: Diagnosis present

## 2024-03-09 DIAGNOSIS — E0590 Thyrotoxicosis, unspecified without thyrotoxic crisis or storm: Secondary | ICD-10-CM | POA: Diagnosis present

## 2024-03-09 DIAGNOSIS — C787 Secondary malignant neoplasm of liver and intrahepatic bile duct: Secondary | ICD-10-CM | POA: Diagnosis present

## 2024-03-09 DIAGNOSIS — Z800 Family history of malignant neoplasm of digestive organs: Secondary | ICD-10-CM

## 2024-03-09 DIAGNOSIS — R160 Hepatomegaly, not elsewhere classified: Secondary | ICD-10-CM

## 2024-03-09 DIAGNOSIS — K858 Other acute pancreatitis without necrosis or infection: Secondary | ICD-10-CM | POA: Diagnosis not present

## 2024-03-09 DIAGNOSIS — Z5986 Financial insecurity: Secondary | ICD-10-CM

## 2024-03-09 DIAGNOSIS — Z833 Family history of diabetes mellitus: Secondary | ICD-10-CM

## 2024-03-09 DIAGNOSIS — R7401 Elevation of levels of liver transaminase levels: Secondary | ICD-10-CM | POA: Diagnosis not present

## 2024-03-09 DIAGNOSIS — C259 Malignant neoplasm of pancreas, unspecified: Secondary | ICD-10-CM | POA: Diagnosis not present

## 2024-03-09 DIAGNOSIS — K859 Acute pancreatitis without necrosis or infection, unspecified: Principal | ICD-10-CM | POA: Diagnosis present

## 2024-03-09 DIAGNOSIS — K219 Gastro-esophageal reflux disease without esophagitis: Secondary | ICD-10-CM | POA: Diagnosis present

## 2024-03-09 DIAGNOSIS — Z8616 Personal history of COVID-19: Secondary | ICD-10-CM | POA: Diagnosis not present

## 2024-03-09 LAB — CBC WITH DIFFERENTIAL/PLATELET
Abs Immature Granulocytes: 0.02 K/uL (ref 0.00–0.07)
Basophils Absolute: 0 K/uL (ref 0.0–0.1)
Basophils Relative: 0 %
Eosinophils Absolute: 0.1 K/uL (ref 0.0–0.5)
Eosinophils Relative: 3 %
HCT: 38 % — ABNORMAL LOW (ref 39.0–52.0)
Hemoglobin: 12 g/dL — ABNORMAL LOW (ref 13.0–17.0)
Immature Granulocytes: 0 %
Lymphocytes Relative: 20 %
Lymphs Abs: 1 K/uL (ref 0.7–4.0)
MCH: 19.9 pg — ABNORMAL LOW (ref 26.0–34.0)
MCHC: 31.6 g/dL (ref 30.0–36.0)
MCV: 63.1 fL — ABNORMAL LOW (ref 80.0–100.0)
Monocytes Absolute: 0.6 K/uL (ref 0.1–1.0)
Monocytes Relative: 12 %
Neutro Abs: 3.1 K/uL (ref 1.7–7.7)
Neutrophils Relative %: 65 %
Platelets: 169 K/uL (ref 150–400)
RBC: 6.02 MIL/uL — ABNORMAL HIGH (ref 4.22–5.81)
RDW: 18.4 % — ABNORMAL HIGH (ref 11.5–15.5)
WBC: 4.8 K/uL (ref 4.0–10.5)
nRBC: 0 % (ref 0.0–0.2)

## 2024-03-09 LAB — COMPREHENSIVE METABOLIC PANEL WITH GFR
ALT: 135 U/L — ABNORMAL HIGH (ref 0–44)
AST: 108 U/L — ABNORMAL HIGH (ref 15–41)
Albumin: 3.9 g/dL (ref 3.5–5.0)
Alkaline Phosphatase: 130 U/L — ABNORMAL HIGH (ref 38–126)
Anion gap: 11 (ref 5–15)
BUN: 10 mg/dL (ref 6–20)
CO2: 26 mmol/L (ref 22–32)
Calcium: 9.4 mg/dL (ref 8.9–10.3)
Chloride: 101 mmol/L (ref 98–111)
Creatinine, Ser: 1.33 mg/dL — ABNORMAL HIGH (ref 0.61–1.24)
GFR, Estimated: >60 mL/min (ref >60)
Glucose, Bld: 110 mg/dL — ABNORMAL HIGH (ref 70–99)
Potassium: 3.4 mmol/L — ABNORMAL LOW (ref 3.5–5.1)
Sodium: 138 mmol/L (ref 135–145)
Total Bilirubin: 1.5 mg/dL — ABNORMAL HIGH (ref 0.0–1.2)
Total Protein: 7.5 g/dL (ref 6.5–8.1)

## 2024-03-09 LAB — LIPASE, BLOOD: Lipase: 137 U/L — ABNORMAL HIGH (ref 11–51)

## 2024-03-09 LAB — TROPONIN I (HIGH SENSITIVITY)
Troponin I (High Sensitivity): 33 ng/L — ABNORMAL HIGH (ref <18)
Troponin I (High Sensitivity): 30 ng/L — ABNORMAL HIGH (ref <18)

## 2024-03-09 MED ORDER — FENTANYL CITRATE PF 50 MCG/ML IJ SOSY
50.0000 ug | PREFILLED_SYRINGE | Freq: Once | INTRAMUSCULAR | Status: AC
  Administered 2024-03-09: 50 ug via INTRAVENOUS
  Filled 2024-03-09: qty 1

## 2024-03-09 MED ORDER — LACTATED RINGERS IV SOLN
INTRAVENOUS | Status: AC
Start: 2024-03-09 — End: 2024-03-10

## 2024-03-09 MED ORDER — HYDRALAZINE HCL 25 MG PO TABS
25.0000 mg | ORAL_TABLET | Freq: Three times a day (TID) | ORAL | Status: DC | PRN
  Administered 2024-03-09: 25 mg via ORAL
  Filled 2024-03-09: qty 1

## 2024-03-09 MED ORDER — SODIUM CHLORIDE 0.9 % IV BOLUS
1000.0000 mL | Freq: Once | INTRAVENOUS | Status: AC
  Administered 2024-03-09: 1000 mL via INTRAVENOUS

## 2024-03-09 MED ORDER — IOHEXOL 350 MG/ML SOLN
75.0000 mL | Freq: Once | INTRAVENOUS | Status: AC | PRN
  Administered 2024-03-09: 75 mL via INTRAVENOUS

## 2024-03-09 MED ORDER — LEVOTHYROXINE SODIUM 25 MCG PO TABS
125.0000 ug | ORAL_TABLET | Freq: Every day | ORAL | Status: DC
  Administered 2024-03-09 – 2024-03-12 (×4): 125 ug via ORAL
  Filled 2024-03-09 (×4): qty 1

## 2024-03-09 MED ORDER — HYDROMORPHONE HCL 1 MG/ML IJ SOLN
1.0000 mg | INTRAMUSCULAR | Status: DC | PRN
  Administered 2024-03-09 – 2024-03-12 (×2): 1 mg via INTRAVENOUS
  Filled 2024-03-09 (×2): qty 1

## 2024-03-09 NOTE — H&P (Addendum)
 History and Physical    Patient: Douglas Harper FMW:[id number] DOB: Apr 16, 1971 DOA: 03/09/2024 DOS: the patient was seen and examined on 03/09/2024 PCP: Medicine, [facility]  Patient coming from: Home  Chief Complaint:  Chief Complaint  Patient presents with   Back Pain   HPI: Douglas Harper is a 53 y.o. male with medical history significant of GERD p/w abd pain and found to have pancreatic head mass with liver masses c/f mets iso acute pancreatitis.  Pt reports abd pain for the past month. Pt was evaluated by Dr. Estelle and prescribed PPI with some relief, but pain never completely resolved. Pt was unable to sleep last night, and was attending a daytime birthday party when he decided to leave and get evaluated at the ED for ongoing abd pain.  In the ED, pt is hypertensive. Labs notable for K 3.4, Cr 1.33, and troponin 33-->30. CT abd/pelvis c/w pancreatic head mass with liver mets iso acute pancreatitis. EDP consulted oncology and requested medicine admission.   Review of Systems: As mentioned in the history of present illness. All other systems reviewed and are negative. Past Medical History:  Diagnosis Date   COVID-19    03/17/20   FH: heart disease    GERD (gastroesophageal reflux disease)    Glaucoma    Hyperthyroidism    s/p RAI 2009    Hypothyroidism    Verruca vulgaris    Past Surgical History:  Procedure Laterality Date   ANTERIOR CRUCIATE LIGAMENT REPAIR Right 2000   HAND SURGERY Right 2001   with hardware implanted   NO PAST SURGERIES     Social History:  reports that he has never smoked. He has never used smokeless tobacco. He reports current alcohol use. He reports that he does not use drugs.  No Known Allergies  Family History  Problem Relation Age of Onset   Heart disease Mother    Hypertension Mother    Diabetes Mother    Colon cancer Neg Hx    Esophageal cancer Neg Hx    Pancreatic cancer Neg Hx    Liver disease Neg Hx    Rectal cancer Neg  Hx    Stomach cancer Neg Hx     Prior to Admission medications   Medication Sig Start Date End Date Taking? Authorizing Provider  benzonatate  (TESSALON ) 100 MG capsule Take 1 capsule (100 mg total) by mouth every 8 (eight) hours as needed for cough. 12/20/22   Mound, Haley E, FNP  fluticasone  (FLONASE ) 50 MCG/ACT nasal spray Place 1 spray into both nostrils daily. 12/20/22   Hazen Darryle FORBES, FNP  levothyroxine  (SYNTHROID ) 125 MCG tablet TAKE 1 TABLET BY MOUTH 30 MINUTES BEFORE BREAKFAST (STOP DOSE) 12/14/23 12/13/24  Mercer Clotilda SAUNDERS, MD  Vitamin D , Ergocalciferol , (DRISDOL ) 1.25 MG (50000 UNIT) CAPS capsule Take 1 capsule (50,000 Units total) by mouth every 7 (seven) days. 07/28/22   Mercer Clotilda SAUNDERS, MD    Physical Exam: Vitals:   03/09/24 9348 03/09/24 9347 03/09/24 0653 03/09/24 1010  BP:    (!) 176/110  Pulse: 60 65 63 (!) 57  Resp: 10 14 14 14   Temp:      SpO2: 100% 100% 99% 100%  Weight:      Height:       General: Alert, oriented x3, resting comfortably in no acute distress Respiratory: Lungs clear to auscultation bilaterally with normal respiratory effort; no w/r/r Cardiovascular: Regular rate and rhythm w/o m/r/g Abdomen: Soft, nontender, nondistended. Positive  bowel sounds   Data Reviewed:  Lab Results  Component Value Date   WBC 4.8 03/09/2024   HGB 12.0 (L) 03/09/2024   HCT 38.0 (L) 03/09/2024   MCV 63.1 (L) 03/09/2024   PLT 169 03/09/2024   Lab Results  Component Value Date   GLUCOSE 110 (H) 03/09/2024   CALCIUM 9.4 03/09/2024   NA 138 03/09/2024   K 3.4 (L) 03/09/2024   CO2 26 03/09/2024   CL 101 03/09/2024   BUN 10 03/09/2024   CREATININE 1.33 (H) 03/09/2024   Lab Results  Component Value Date   ALT 135 (H) 03/09/2024   AST 108 (H) 03/09/2024   ALKPHOS 130 (H) 03/09/2024   BILITOT 1.5 (H) 03/09/2024   No results found for: INR, PROTIME Radiology: CT ABDOMEN PELVIS W WO CONTRAST Result Date: 03/09/2024 CLINICAL DATA:  Liver mass on right  upper quadrant ultrasound, chest pain. * Tracking Code: BO * EXAM: CT CHEST WITH CONTRAST CT ABDOMEN WITHOUT AND WITH CONTRAST TECHNIQUE: Multidetector CT imaging of the abdomen was performed without intravenous contrast. Multidetector CT imaging of the chest and abdomen was then performed during bolus administration of intravenous contrast. RADIATION DOSE REDUCTION: This exam was performed according to the departmental dose-optimization program which includes automated exposure control, adjustment of the mA and/or kV according to patient size and/or use of iterative reconstruction technique. CONTRAST:  75mL OMNIPAQUE  IOHEXOL  350 MG/ML SOLN COMPARISON:  Ultrasound abdomen from earlier the same day. FINDINGS: CT CHEST FINDINGS Cardiovascular: Normal cardiac size. No pericardial effusion. No aortic aneurysm. There are coronary artery calcifications, in keeping with coronary artery disease. Mediastinum/Nodes: Visualized thyroid  gland appears grossly unremarkable. No solid / cystic mediastinal masses. The esophagus is nondistended precluding optimal assessment. No axillary, mediastinal or hilar lymphadenopathy by size criteria. Lungs/Pleura: The central tracheo-bronchial tree is patent. There are dependent changes in bilateral lungs. No mass or consolidation. No pleural effusion or pneumothorax. No suspicious lung nodules. Musculoskeletal: The visualized soft tissues of the chest wall are grossly unremarkable. No suspicious osseous lesions. CT ABDOMEN PELVIS FINDINGS Hepatobiliary: The liver is normal in size. There is liver surface irregularity/nodularity, suggesting cirrhosis cirrhotic appearance due to underlying liver lesions described as follows. There are multiple (more than 15), ill-defined hypoattenuating masses throughout the liver with largest in the left hepatic lobe, segment 4 B/3 junction region measuring up to 3.0 x 3.2 cm. No intrahepatic or extrahepatic bile duct dilation. No calcified gallstones. Normal  gallbladder wall thickness. No pericholecystic inflammatory changes. Pancreas: There is an ill-defined, hypoenhancing, approximately 1.6 x 1.9 cm lesion in the pancreatic head, which is highly concerning for pancreatic malignancy. There is resultant moderate upstream main pancreatic duct dilation, measuring up to 8 mm in the neck region. No peripancreatic fat stranding. There is resultant up to moderate narrowing of the portal vein at the level of confluence of SMV/splenic veins (series 7, image 66). Rest of the main portal vein and its tributaries are otherwise normal in caliber and patent. Unremarkable hepatic veins. There is subtle fat stranding surrounding the pancreatic head/uncinate process and neck region, which is nonspecific but can be seen with acute interstitial pancreatitis. Correlate clinically and with serum lipase levels. Spleen: Within normal limits. No focal lesion. Adrenals/Urinary Tract: There are bilateral adrenal adenomas. On the left it measures up to 1.0 x 1.6 cm and on the right it measures up to 0.9 x 1.2 cm. No suspicious renal mass. There is a 1.3 x 1.6 cm simple cyst arising from the left kidney upper  pole. There is a 6 x 10 mm nonobstructing calculus in the left kidney upper pole and an additional 2 mm nonobstructing calculus in the left kidney lower pole. No other nephroureterolithiasis on either side. No obstructive uropathy on either side. Urinary bladder is under distended, precluding optimal assessment. However, no large mass or stones identified. No perivesical fat stranding. Stomach/Bowel: No disproportionate dilation of the small or large bowel loops. No evidence of abnormal bowel wall thickening. There is subtle fat stranding surrounding the proximal duodenum, which is nonspecific but typically seen as sequela of pancreatitis or duodenitis. Correlate clinically. The appendix is unremarkable. Vascular/Lymphatic: No ascites or pneumoperitoneum. No abdominal or pelvic  lymphadenopathy, by size criteria. No aneurysmal dilation of the major abdominal arteries. Reproductive: Normal size prostate. Symmetric seminal vesicles. Other: There is a tiny fat containing umbilical hernia. There are bilateral small fat containing inguinal hernias. Musculoskeletal: No suspicious osseous lesions. There are mild multilevel degenerative changes in the visualized spine. IMPRESSION: 1. There is an ill-defined, hypoenhancing, approximately 1.6 x 1.9 cm lesion in the pancreatic head, which is highly concerning for pancreatic malignancy. There is resultant moderate upstream main pancreatic duct dilation, measuring up to 8 mm in the neck region. There is also resultant up to moderate narrowing of the portal vein at the level of confluence of SMV/splenic veins. 2. There are multiple (more than 15), ill-defined hypoattenuating masses throughout the liver with largest in the left hepatic lobe, segment 4B/3 junction region measuring up to 3.0 x 3.2 cm. These are compatible with metastases. 3. There is subtle fat stranding surrounding the pancreatic head/uncinate process and neck region, which is nonspecific but can be seen with acute interstitial pancreatitis. There is subtle fat stranding surrounding the proximal duodenum, which is typically seen as sequela of pancreatitis or duodenitis. 4. No metastatic disease identified within the chest. 5. Multiple other nonacute observations, as described above. Aortic Atherosclerosis (ICD10-I70.0). Electronically Signed   By: Ree Molt M.D.   On: 03/09/2024 10:17   CT CHEST W CONTRAST Result Date: 03/09/2024 CLINICAL DATA:  Liver mass on right upper quadrant ultrasound, chest pain. * Tracking Code: BO * EXAM: CT CHEST WITH CONTRAST CT ABDOMEN WITHOUT AND WITH CONTRAST TECHNIQUE: Multidetector CT imaging of the abdomen was performed without intravenous contrast. Multidetector CT imaging of the chest and abdomen was then performed during bolus administration of  intravenous contrast. RADIATION DOSE REDUCTION: This exam was performed according to the departmental dose-optimization program which includes automated exposure control, adjustment of the mA and/or kV according to patient size and/or use of iterative reconstruction technique. CONTRAST:  75mL OMNIPAQUE  IOHEXOL  350 MG/ML SOLN COMPARISON:  Ultrasound abdomen from earlier the same day. FINDINGS: CT CHEST FINDINGS Cardiovascular: Normal cardiac size. No pericardial effusion. No aortic aneurysm. There are coronary artery calcifications, in keeping with coronary artery disease. Mediastinum/Nodes: Visualized thyroid  gland appears grossly unremarkable. No solid / cystic mediastinal masses. The esophagus is nondistended precluding optimal assessment. No axillary, mediastinal or hilar lymphadenopathy by size criteria. Lungs/Pleura: The central tracheo-bronchial tree is patent. There are dependent changes in bilateral lungs. No mass or consolidation. No pleural effusion or pneumothorax. No suspicious lung nodules. Musculoskeletal: The visualized soft tissues of the chest wall are grossly unremarkable. No suspicious osseous lesions. CT ABDOMEN PELVIS FINDINGS Hepatobiliary: The liver is normal in size. There is liver surface irregularity/nodularity, suggesting cirrhosis cirrhotic appearance due to underlying liver lesions described as follows. There are multiple (more than 15), ill-defined hypoattenuating masses throughout the liver with largest in the  left hepatic lobe, segment 4 B/3 junction region measuring up to 3.0 x 3.2 cm. No intrahepatic or extrahepatic bile duct dilation. No calcified gallstones. Normal gallbladder wall thickness. No pericholecystic inflammatory changes. Pancreas: There is an ill-defined, hypoenhancing, approximately 1.6 x 1.9 cm lesion in the pancreatic head, which is highly concerning for pancreatic malignancy. There is resultant moderate upstream main pancreatic duct dilation, measuring up to 8 mm  in the neck region. No peripancreatic fat stranding. There is resultant up to moderate narrowing of the portal vein at the level of confluence of SMV/splenic veins (series 7, image 66). Rest of the main portal vein and its tributaries are otherwise normal in caliber and patent. Unremarkable hepatic veins. There is subtle fat stranding surrounding the pancreatic head/uncinate process and neck region, which is nonspecific but can be seen with acute interstitial pancreatitis. Correlate clinically and with serum lipase levels. Spleen: Within normal limits. No focal lesion. Adrenals/Urinary Tract: There are bilateral adrenal adenomas. On the left it measures up to 1.0 x 1.6 cm and on the right it measures up to 0.9 x 1.2 cm. No suspicious renal mass. There is a 1.3 x 1.6 cm simple cyst arising from the left kidney upper pole. There is a 6 x 10 mm nonobstructing calculus in the left kidney upper pole and an additional 2 mm nonobstructing calculus in the left kidney lower pole. No other nephroureterolithiasis on either side. No obstructive uropathy on either side. Urinary bladder is under distended, precluding optimal assessment. However, no large mass or stones identified. No perivesical fat stranding. Stomach/Bowel: No disproportionate dilation of the small or large bowel loops. No evidence of abnormal bowel wall thickening. There is subtle fat stranding surrounding the proximal duodenum, which is nonspecific but typically seen as sequela of pancreatitis or duodenitis. Correlate clinically. The appendix is unremarkable. Vascular/Lymphatic: No ascites or pneumoperitoneum. No abdominal or pelvic lymphadenopathy, by size criteria. No aneurysmal dilation of the major abdominal arteries. Reproductive: Normal size prostate. Symmetric seminal vesicles. Other: There is a tiny fat containing umbilical hernia. There are bilateral small fat containing inguinal hernias. Musculoskeletal: No suspicious osseous lesions. There are mild  multilevel degenerative changes in the visualized spine. IMPRESSION: 1. There is an ill-defined, hypoenhancing, approximately 1.6 x 1.9 cm lesion in the pancreatic head, which is highly concerning for pancreatic malignancy. There is resultant moderate upstream main pancreatic duct dilation, measuring up to 8 mm in the neck region. There is also resultant up to moderate narrowing of the portal vein at the level of confluence of SMV/splenic veins. 2. There are multiple (more than 15), ill-defined hypoattenuating masses throughout the liver with largest in the left hepatic lobe, segment 4B/3 junction region measuring up to 3.0 x 3.2 cm. These are compatible with metastases. 3. There is subtle fat stranding surrounding the pancreatic head/uncinate process and neck region, which is nonspecific but can be seen with acute interstitial pancreatitis. There is subtle fat stranding surrounding the proximal duodenum, which is typically seen as sequela of pancreatitis or duodenitis. 4. No metastatic disease identified within the chest. 5. Multiple other nonacute observations, as described above. Aortic Atherosclerosis (ICD10-I70.0). Electronically Signed   By: Ree Molt M.D.   On: 03/09/2024 10:17   US  Abdomen Limited RUQ (LIVER/GB) Result Date: 03/09/2024 CLINICAL DATA:  [id number].  Elevated liver enzymes. EXAM: ULTRASOUND ABDOMEN LIMITED RIGHT UPPER QUADRANT COMPARISON:  Ultrasound complete abdomen from 12/23/2018. FINDINGS: Gallbladder: No gallstones or wall thickening visualized. The gallbladder is mildly dilated measuring 11 cm in length. There  is a small amount of hypoechoic layering sludge. No sonographic Murphy sign noted by sonographer. Common bile duct: Diameter: Measures prominent at 7.4 mm mild intrahepatic biliary prominence. Liver: Not seen in 2020, there is a heterogeneously hypoechoic solid mass with scattered color flow in the left lobe measuring 3.5 x 2.9 x 3.3 cm, worrisome for primary or metastatic  neoplasm. CT or MRI recommended preferably without and with contrast. Otherwise, the liver is within normal limits in parenchymal echogenicity. Portal vein is patent on color Doppler imaging with normal direction of blood flow towards the liver. Other: No right upper quadrant ascites. IMPRESSION: 1. 3.5 x 2.9 x 3.3 cm heterogeneously hypoechoic solid mass in the left lobe of the liver, worrisome for primary or metastatic neoplasm. CT or MRI recommended preferably without and with contrast. 2. Mildly dilated gallbladder with a small amount of sludge. No gallstones or wall thickening. 3. Prominent common bile duct at 7.4 mm with mild intrahepatic biliary prominence. Electronically Signed   By: Francis Quam M.D.   On: 03/09/2024 07:46   DG Ribs Unilateral W/Chest Left Result Date: 03/09/2024 CLINICAL DATA:  53 year old male with back and rib pain for 1 month. EXAM: LEFT RIBS AND CHEST - 3+ VIEW COMPARISON:  None Available. FINDINGS: PA view of the chest 0457 hours. Low normal lung volumes. Normal cardiac size and mediastinal contours. Visualized tracheal air column is within normal limits. Both lungs appear clear. No pneumothorax or pleural effusion. Four oblique views of the left ribs. Bone mineralization is within normal limits. 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 EKG leads project over the left chest. No left rib fracture or rib lesion identified. Other visible osseous structures appear intact. Negative visible bowel gas pattern. However, left abdominal oval, crescent-shaped 13 mm calculus is visible on multiple views, suspicious for left nephrolithiasis. IMPRESSION: 1. No acute cardiopulmonary abnormality.  Negative left radiographs. 2. Evidence of bulky 13 mm left renal calculus.  Query renal colic. Electronically Signed   By: VEAR Hurst M.D.   On: 03/09/2024 05:32    Assessment and Plan: 5M h/o GERD p/w abd pain and found to have pancreatic head mass with liver masses c/f mets iso acute pancreatitis.  Acute  pancreatitis Pancreatic head mass w/ liver masses c/f mets -Oncology consulted per EDP; apprec eval/recs -IR consulted for biopsy (Dr. Cordella Banner aware); apprec eval/recs -IV LR at 150cc/h for now -IV dilaudid  1mg  q3h prn -Full liquid diet for now; ADAT; NPO at [place] for anticipated bx above  Hypothyroid -Synthroid  125mcg daily   Advance Care Planning:   Code Status: Not on file   Consults: Oncology  Family Communication: N/A  Severity of Illness: The appropriate patient status for this patient is INPATIENT. Inpatient status is judged to be reasonable and necessary in order to provide the required intensity of service to ensure the patient's safety. The patient's presenting symptoms, physical exam findings, and initial radiographic and laboratory data in the context of their chronic comorbidities is felt to place them at high risk for further clinical deterioration. Furthermore, it is not anticipated that the patient will be medically stable for discharge from the hospital within 2 midnights of admission.   * I certify that at the point of admission it is my clinical judgment that the patient will require inpatient hospital care spanning beyond 2 midnights from the point of admission due to high intensity of service, high risk for further deterioration and high frequency of surveillance required.*   ------- I spent 61 minutes reviewing previous  notes, at the bedside counseling/discussing the treatment plan, and performing clinical documentation.  Author: Marsha Ada, MD 03/09/2024 11:06 AM  For on call review [email or web address].

## 2024-03-09 NOTE — ED Provider Notes (Signed)
 Douglas Harper Provider Note   CSN: [id number] Arrival date & time: 03/09/24  9657     Patient presents with: Back Pain   Douglas Harper is a 53 y.o. male who presents for 1 month of intermittent epigastric and left-sided chest pain that is nonradiating, nonexertional.  Pain is worsened after eating, associated heartburn symptoms and belching.  No fevers or chills, some nausea but no vomiting, no diarrhea.  Patient does have history of septal hypertrophic cardiomyopathy but has not seen cardiology since April 2024.  He is not anticoagulated.  No shortness of breath, no recent prolonged travel or immobilization, no history of malignancy or clotting.   HPI     Prior to Admission medications   Medication Sig Start Date End Date Taking? Authorizing Provider  benzonatate  (TESSALON ) 100 MG capsule Take 1 capsule (100 mg total) by mouth every 8 (eight) hours as needed for cough. 12/20/22   Hazen Darryle FORBES, FNP  fluticasone  (FLONASE ) 50 MCG/ACT nasal spray Place 1 spray into both nostrils daily. 12/20/22   Hazen Darryle FORBES, FNP  levothyroxine  (SYNTHROID ) 125 MCG tablet TAKE 1 TABLET BY MOUTH 30 MINUTES BEFORE BREAKFAST (STOP DOSE) 12/14/23 12/13/24  Mercer Clotilda SAUNDERS, MD  Vitamin D , Ergocalciferol , (DRISDOL ) 1.25 MG (50000 UNIT) CAPS capsule Take 1 capsule (50,000 Units total) by mouth every 7 (seven) days. 07/28/22   Mercer Clotilda SAUNDERS, MD    Allergies: Patient has no known allergies.    Review of Systems  Cardiovascular:  Positive for chest pain. Negative for palpitations and leg swelling.  Gastrointestinal:  Negative for abdominal pain, diarrhea, nausea and vomiting.    Updated Vital Signs BP (!) 162/107   Pulse 63   Temp (!) 97.5 F (36.4 C)   Resp 14   Ht 6' 4 (1.93 m)   Wt 93.9 kg   SpO2 99%   BMI 25.20 kg/m   Physical Exam Vitals and nursing note reviewed.  Constitutional:      Appearance: He is not ill-appearing or toxic-appearing.   HENT:     Head: Normocephalic and atraumatic.     Mouth/Throat:     Mouth: Mucous membranes are moist.     Pharynx: No oropharyngeal exudate or posterior oropharyngeal erythema.  Eyes:     General:        Right eye: No discharge.        Left eye: No discharge.     Conjunctiva/sclera: Conjunctivae normal.  Cardiovascular:     Rate and Rhythm: Normal rate and regular rhythm.     Pulses: Normal pulses.     Heart sounds: Normal heart sounds. No murmur heard. Pulmonary:     Effort: Pulmonary effort is normal. No respiratory distress.     Breath sounds: Normal breath sounds. No wheezing or rales.  Abdominal:     General: Bowel sounds are normal. There is no distension.     Palpations: Abdomen is soft.     Tenderness: There is abdominal tenderness in the right upper quadrant and epigastric area. There is no right CVA tenderness, left CVA tenderness, guarding or rebound.  Musculoskeletal:        General: No deformity.     Cervical back: Neck supple.     Right lower leg: No edema.     Left lower leg: No edema.  Skin:    General: Skin is warm and dry.  Neurological:     General: No focal deficit present.  Mental Status: He is alert and oriented to person, place, and time. Mental status is at baseline.  Psychiatric:        Mood and Affect: Mood normal.     (all labs ordered are listed, but only abnormal results are displayed) Labs Reviewed  CBC WITH DIFFERENTIAL/PLATELET - Abnormal; Notable for the following components:      Result Value   RBC 6.02 (*)    Hemoglobin 12.0 (*)    HCT 38.0 (*)    MCV 63.1 (*)    MCH 19.9 (*)    RDW 18.4 (*)    All other components within normal limits  COMPREHENSIVE METABOLIC PANEL WITH GFR - Abnormal; Notable for the following components:   Potassium 3.4 (*)    Glucose, Bld 110 (*)    Creatinine, Ser 1.33 (*)    AST 108 (*)    ALT 135 (*)    Alkaline Phosphatase 130 (*)    Total Bilirubin 1.5 (*)    All other components within normal  limits  TROPONIN I (HIGH SENSITIVITY) - Abnormal; Notable for the following components:   Troponin I (High Sensitivity) 33 (*)    All other components within normal limits  LIPASE, BLOOD  TROPONIN I (HIGH SENSITIVITY)    EKG: EKG Interpretation Date/Time:  Sunday March 09 2024 04:31:43 EDT Ventricular Rate:  62 PR Interval:  204 QRS Duration:  83 QT Interval:  431 QTC Calculation: 438 R Axis:   62  Text Interpretation: Sinus rhythm Borderline prolonged PR interval Abnrm T, probable ischemia, anterolateral lds Borderline ST elevation, anterior leads Confirmed by Griselda Norris [phone]) on 03/09/2024 4:53:15 AM  Radiology: [facility] Ribs Unilateral W/Chest Left Result Date: 03/09/2024 CLINICAL DATA:  54 year old male with back and rib pain for 1 month. EXAM: LEFT RIBS AND CHEST - 3+ VIEW COMPARISON:  None Available. FINDINGS: PA view of the chest 0457 hours. Low normal lung volumes. Normal cardiac size and mediastinal contours. Visualized tracheal air column is within normal limits. Both lungs appear clear. No pneumothorax or pleural effusion. Four oblique views of the left ribs. Bone mineralization is within normal limits. 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 EKG leads project over the left chest. No left rib fracture or rib lesion identified. Other visible osseous structures appear intact. Negative visible bowel gas pattern. However, left abdominal oval, crescent-shaped 13 mm calculus is visible on multiple views, suspicious for left nephrolithiasis. IMPRESSION: 1. No acute cardiopulmonary abnormality.  Negative left radiographs. 2. Evidence of bulky 13 mm left renal calculus.  Query renal colic. Electronically Signed   By: VEAR Hurst M.D.   On: 03/09/2024 05:32     Procedures   Medications Ordered in the ED  fentaNYL  (SUBLIMAZE ) injection 50 mcg (50 mcg Intravenous Given 03/09/24 0646)                                    Medical Decision Making 53 year old male presents with concern for  epigastric pain and reflux symptoms.  Hypertensive on intake with vital signs otherwise normal.  Cardiopulmonary Sam unremarkable, abdominal exam is significant for epigastric tenderness palpation, mild right upper quadrant tenderness palpation.  Amount and/or Complexity of Data Reviewed Labs: ordered.    Details: CBC with mild anemia with hemoglobin of 12.  CMP with potassium of 3.4, creatinine is 1.3, transaminitis with AST/ALT 108/135.  Elevated bili to 1.5. Troponin mildly elevated to 33 Radiology: ordered.    Details:  Chest x-ray negative aside from bulaky 13 mm left renal calculus.  Risk Prescription drug management.    Care of this patient signed out to oncoming ED provider E. Scott, PA-C at time of shift change. All pertinent HPI, physical exam, and laboratory findings were discussed with them prior to my departure. Disposition of patient pending completion of workup, reevaluation, and clinical judgement of oncoming ED provider.   This chart was dictated using voice recognition software, Dragon. Despite the best efforts of this provider to proofread and correct errors, errors may still occur which can change documentation meaning.      Final diagnoses:  None    ED Discharge Orders     None          Bobette Pleasant JONELLE DEVONNA 03/09/24 0725    Griselda Norris, MD 03/09/24 [phone]

## 2024-03-09 NOTE — ED Provider Notes (Signed)
 Physical Exam   Vitals:   03/09/24 0650 03/09/24 0651 03/09/24 0652 03/09/24 0653  BP: (!) 162/107     Pulse: 64 60 65 63  Resp: 13 10 14 14   Temp:      SpO2: 100% 100% 100% 99%  Weight:      Height:         Physical Exam Vitals and nursing note reviewed.  HENT:     Head: Normocephalic.  Eyes:     Extraocular Movements: Extraocular movements intact.  Cardiovascular:     Rate and Rhythm: Normal rate and regular rhythm.     Heart sounds: Normal heart sounds.  Pulmonary:     Effort: Pulmonary effort is normal.     Breath sounds: Normal breath sounds.  Abdominal:     Palpations: Abdomen is soft.     Tenderness: There is no abdominal tenderness. There is no guarding.  Musculoskeletal:     Cervical back: Normal range of motion.     Comments: Moves all extremities spontaneously without difficulty  Skin:    General: Skin is warm and dry.  Neurological:     Mental Status: He is alert and oriented to person, place, and time.     Procedures  Procedures  ED Course / MDM    Medical Decision Making Amount and/or Complexity of Data Reviewed Labs: ordered. Radiology: ordered.  Risk Prescription drug management.   Patient received at shift change from previous EDP, Rebekah Sponsellar PA-C.  Please see their note for initial history, physical exam findings, lab/imaging interpretation, initial assessment and plan.  Patient presenting with 1 month of epigastric and left-sided chest pain, symptoms worsened overnight and were exacerbated by eating, he does not have any radiation of this pain.  He was recently started on omeprazole by his primary care provider.  History of hypertrophic cardiomyopathy, was last seen by cardiology in April of last year.  Initial troponin of 33, repeat pending.  Patient found to have elevations in his LFTs/bilirubin today, obtaining right upper quadrant ultrasound for further evaluation of this.  CBC also notable for findings consistent with microcytic  anemia with hemoglobin of 12.  Upon record review, patient had labs completed at an outside facility on 9/15, at that time his total bilirubin was elevated to 1.8, alkaline phosphatase elevated to 155, ALT elevated to 50.  Troponin: Second troponin 30, this is stable from previous of 33 Low suspicion for cardiac etiology contributing to his symptoms today.  RUQ US :  1. 3.5 x 2.9 x 3.3 cm heterogeneously hypoechoic solid mass in the left lobe of the liver, worrisome for primary or metastatic neoplasm. CT or MRI recommended preferably without and with contrast.  2. Mildly dilated gallbladder with a small amount of sludge. No gallstones or wall thickening.  3. Prominent common bile duct at 7.4 mm with mild intrahepatic biliary prominence.  Lipase elevated at 137  Right upper quadrant ultrasound notable for mass in the left lobe of the liver as well as enlarged common bile duct, will follow-up with CT chest without contrast as well as CT abdomen/pelvis with and without contrast for further evaluation of this issue.  I communicated the results of the ultrasound with the patient.  At reassessment, he tells me that his chest pain has improved from time his initial assessment, describes left-sided chest/epigastric discomfort like food is stuck in his chest.  No abdominal tenderness to palpation on my exam.  CT chest/abdomen/pelvis: 1. There is an ill-defined, hypoenhancing, approximately 1.6 x 1.9  cm lesion in the pancreatic head, which is highly concerning for pancreatic malignancy. There is resultant moderate upstream main pancreatic duct dilation, measuring up to 8 mm in the neck region. There is also resultant up to moderate narrowing of the portal vein at the level of confluence of SMV/splenic veins. 2. There are multiple (more than 15), ill-defined hypoattenuating masses throughout the liver with largest in the left hepatic lobe, segment 4B/3 junction region measuring up to 3.0 x 3.2 cm.  These are compatible with metastases. 3. There is subtle fat stranding surrounding the pancreatic head/uncinate process and neck region, which is nonspecific but can be seen with acute interstitial pancreatitis. There is subtle fat stranding surrounding the proximal duodenum, which is typically seen as sequela of pancreatitis or duodenitis. 4. No metastatic disease identified within the chest. 5. Multiple other nonacute observations, as described above.  Aortic Atherosclerosis (ICD10-I70.0).  IV fluid bolus given for suspected pancreatitis  I spoke with the oncology service, Dr. Sherrod, who is aware of this patient and plans to discuss him with his colleague for additional work-up during his hospitalization.  I spoke with the hospitalist service, Dr. Georgina, who agrees that this patient is appropriate for admission for suspected pancreatitis in the setting of what is believed to be new pancreatic cancer with liver mets. I reviewed the labs/imaging results in depth with the patient and discussed this plan as above, he is in agreement.       Glendia Rocky SAILOR, [place] 03/09/24 1114    Garrick Charleston, MD 03/10/24 [phone]

## 2024-03-09 NOTE — ED Notes (Signed)
 Patient transported to CT

## 2024-03-09 NOTE — Consult Note (Signed)
 [facility] [phone]   [phone]) [phone]  CONSULT NOTE  REFERRING PHYSICIAN: Dr. Marsha Ada  REASON FOR CONSULTATION:  53 years old African-American male with suspicious pancreatic cancer.  HPI OSAMAH SCHMADER is a 53 y.o. male.   Discussed the use of AI scribe software for clinical note transcription with the patient, who gave verbal consent to proceed. The patient is a 53 year old with suspicious pancreatic adenocarcinoma who presents with epigastric discomfort and back pain. He is accompanied by his sister and two friends. He was referred by Dr. Estelle for evaluation of gastrointestinal symptoms.  He has been experiencing persistent discomfort in the epigastric region and back pain for the past month. The sensation is described as if 'the food wasn't going down' and felt like it was 'sitting by my heart'. No difficulty swallowing is noted, but food seems to sit in the chest area. This discomfort became intolerable after a night out, prompting him to seek medical attention.  The patient reports that he was told by the medical team that a mass was found on his pancreas and spots were found on his liver.  His scan of the chest, abdomen pelvis showed an ill-defined, hypoenhancing, approximately 1.6 x 1.9 cm lesion in the pancreatic head, which is highly concerning for pancreatic malignancy. There are multiple (more than 15), ill-defined hypoattenuating masses throughout the liver with largest in the left hepatic lobe, segment 4B/3 junction region measuring up to 3.0 x 3.2 cm. These are compatible with metastases. He was initially seen by Dr. Estelle, who referred him to a gastroenterologist, but due to scheduling delays, he has not yet seen the specialist.  He denies any recent weight loss, attributing any changes to his diet and exercise regimen. He follows a diet that excludes bread and potatoes. No nausea, vomiting, diarrhea, chest pain, shortness of breath,  headaches, or bleeding issues are reported.  His past medical history includes anemia, for which he is unsure of the cause. He underwent a colonoscopy and endoscopy four years ago, both of which were normal. He is on levothyroxine  for hypothyroidism.  Family history is significant for his mother having thyroid  issues and high blood pressure, and his sister having colon cancer at age 68.  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 He has two children, does not smoke, drinks alcohol occasionally, and denies any drug use.      Past Medical History:  Diagnosis Date   COVID-19    03/17/20   FH: heart disease    GERD (gastroesophageal reflux disease)    Glaucoma    Hyperthyroidism    s/p RAI 2009    Hypothyroidism    Verruca vulgaris       Past Surgical History:  Procedure Laterality Date   ANTERIOR CRUCIATE LIGAMENT REPAIR Right 2000   HAND SURGERY Right 2001   with hardware implanted   NO PAST SURGERIES      Family History  Problem Relation Age of Onset   Heart disease Mother    Hypertension Mother    Diabetes Mother    Colon cancer Neg Hx    Esophageal cancer Neg Hx    Pancreatic cancer Neg Hx    Liver disease Neg Hx    Rectal cancer Neg Hx    Stomach cancer Neg Hx     Social History Social History   Tobacco Use   Smoking status: Never   Smokeless tobacco:  Never  Substance Use Topics   Alcohol use: Yes    Comment: socially   Drug use: Never    No Known Allergies  Current Facility-Administered Medications  Medication Dose Route Frequency Provider Last Rate Last Admin   hydrALAZINE  (APRESOLINE ) tablet 25 mg  25 mg Oral Q8H PRN Georgina Basket, MD   25 mg at 03/09/24 1243   HYDROmorphone  (DILAUDID ) injection 1 mg  1 mg Intravenous Q3H PRN Georgina Basket, MD       lactated ringers  infusion   Intravenous Continuous Georgina Basket, MD 150 mL/hr at 03/09/24 1246 New Bag at 03/09/24 1246   levothyroxine  (SYNTHROID ) tablet 125 mcg   125 mcg Oral Q0600 Georgina Basket, MD   125 mcg at 03/09/24 1243    Review of Systems  Constitutional: negative Eyes: negative Ears, nose, mouth, throat, and face: negative Respiratory: negative Cardiovascular: negative Gastrointestinal: positive for abdominal pain Genitourinary:negative Integument/breast: negative Hematologic/lymphatic: negative Musculoskeletal:negative Neurological: negative Behavioral/Psych: negative Endocrine: negative Allergic/Immunologic: negative  Physical Exam  MJO:jozmu, healthy, no distress, well nourished, and well developed SKIN: skin color, texture, turgor are normal, no rashes or significant lesions HEAD: Normocephalic, No masses, lesions, tenderness or abnormalities EYES: normal, PERRLA, Conjunctiva are pink and non-injected EARS: External ears normal, Canals clear OROPHARYNX:no exudate, no erythema, and lips, buccal mucosa, and tongue normal  NECK: supple, no adenopathy, no JVD LYMPH:  no palpable lymphadenopathy, no hepatosplenomegaly LUNGS: clear to auscultation , and palpation HEART: regular rate & rhythm, no murmurs, and no gallops ABDOMEN:abdomen soft, non-tender, normal bowel sounds, and no masses or organomegaly BACK: Back symmetric, no curvature., No CVA tenderness EXTREMITIES:no joint deformities, effusion, or inflammation, no edema  NEURO: alert & oriented x 3 with fluent speech, no focal motor/sensory deficits  PERFORMANCE STATUS: ECOG 1  LABORATORY DATA: Lab Results  Component Value Date   WBC 4.8 03/09/2024   HGB 12.0 (L) 03/09/2024   HCT 38.0 (L) 03/09/2024   MCV 63.1 (L) 03/09/2024   PLT 169 03/09/2024    @LASTCHEM @  RADIOGRAPHIC STUDIES: CT ABDOMEN PELVIS W WO CONTRAST Result Date: 03/09/2024 CLINICAL DATA:  Liver mass on right upper quadrant ultrasound, chest pain. * Tracking Code: BO * EXAM: CT CHEST WITH CONTRAST CT ABDOMEN WITHOUT AND WITH CONTRAST TECHNIQUE: Multidetector CT imaging of the abdomen was performed  without intravenous contrast. Multidetector CT imaging of the chest and abdomen was then performed during bolus administration of intravenous contrast. RADIATION DOSE REDUCTION: This exam was performed according to the departmental dose-optimization program which includes automated exposure control, adjustment of the mA and/or kV according to patient size and/or use of iterative reconstruction technique. CONTRAST:  75mL OMNIPAQUE  IOHEXOL  350 MG/ML SOLN COMPARISON:  Ultrasound abdomen from earlier the same day. FINDINGS: CT CHEST FINDINGS Cardiovascular: Normal cardiac size. No pericardial effusion. No aortic aneurysm. There are coronary artery calcifications, in keeping with coronary artery disease. Mediastinum/Nodes: Visualized thyroid  gland appears grossly unremarkable. No solid / cystic mediastinal masses. The esophagus is nondistended precluding optimal assessment. No axillary, mediastinal or hilar lymphadenopathy by size criteria. Lungs/Pleura: The central tracheo-bronchial tree is patent. There are dependent changes in bilateral lungs. No mass or consolidation. No pleural effusion or pneumothorax. No suspicious lung nodules. Musculoskeletal: The visualized soft tissues of the chest wall are grossly unremarkable. No suspicious osseous lesions. CT ABDOMEN PELVIS FINDINGS Hepatobiliary: The liver is normal in size. There is liver surface irregularity/nodularity, suggesting cirrhosis cirrhotic appearance due to underlying liver lesions described as follows. There are multiple (more than 15), ill-defined hypoattenuating masses  throughout the liver with largest in the left hepatic lobe, segment 4 B/3 junction region measuring up to 3.0 x 3.2 cm. No intrahepatic or extrahepatic bile duct dilation. No calcified gallstones. Normal gallbladder wall thickness. No pericholecystic inflammatory changes. Pancreas: There is an ill-defined, hypoenhancing, approximately 1.6 x 1.9 cm lesion in the pancreatic head, which is  highly concerning for pancreatic malignancy. There is resultant moderate upstream main pancreatic duct dilation, measuring up to 8 mm in the neck region. No peripancreatic fat stranding. There is resultant up to moderate narrowing of the portal vein at the level of confluence of SMV/splenic veins (series 7, image 66). Rest of the main portal vein and its tributaries are otherwise normal in caliber and patent. Unremarkable hepatic veins. There is subtle fat stranding surrounding the pancreatic head/uncinate process and neck region, which is nonspecific but can be seen with acute interstitial pancreatitis. Correlate clinically and with serum lipase levels. Spleen: Within normal limits. No focal lesion. Adrenals/Urinary Tract: There are bilateral adrenal adenomas. On the left it measures up to 1.0 x 1.6 cm and on the right it measures up to 0.9 x 1.2 cm. No suspicious renal mass. There is a 1.3 x 1.6 cm simple cyst arising from the left kidney upper pole. There is a 6 x 10 mm nonobstructing calculus in the left kidney upper pole and an additional 2 mm nonobstructing calculus in the left kidney lower pole. No other nephroureterolithiasis on either side. No obstructive uropathy on either side. Urinary bladder is under distended, precluding optimal assessment. However, no large mass or stones identified. No perivesical fat stranding. Stomach/Bowel: No disproportionate dilation of the small or large bowel loops. No evidence of abnormal bowel wall thickening. There is subtle fat stranding surrounding the proximal duodenum, which is nonspecific but typically seen as sequela of pancreatitis or duodenitis. Correlate clinically. The appendix is unremarkable. Vascular/Lymphatic: No ascites or pneumoperitoneum. No abdominal or pelvic lymphadenopathy, by size criteria. No aneurysmal dilation of the major abdominal arteries. Reproductive: Normal size prostate. Symmetric seminal vesicles. Other: There is a tiny fat containing  umbilical hernia. There are bilateral small fat containing inguinal hernias. Musculoskeletal: No suspicious osseous lesions. There are mild multilevel degenerative changes in the visualized spine. IMPRESSION: 1. There is an ill-defined, hypoenhancing, approximately 1.6 x 1.9 cm lesion in the pancreatic head, which is highly concerning for pancreatic malignancy. There is resultant moderate upstream main pancreatic duct dilation, measuring up to 8 mm in the neck region. There is also resultant up to moderate narrowing of the portal vein at the level of confluence of SMV/splenic veins. 2. There are multiple (more than 15), ill-defined hypoattenuating masses throughout the liver with largest in the left hepatic lobe, segment 4B/3 junction region measuring up to 3.0 x 3.2 cm. These are compatible with metastases. 3. There is subtle fat stranding surrounding the pancreatic head/uncinate process and neck region, which is nonspecific but can be seen with acute interstitial pancreatitis. There is subtle fat stranding surrounding the proximal duodenum, which is typically seen as sequela of pancreatitis or duodenitis. 4. No metastatic disease identified within the chest. 5. Multiple other nonacute observations, as described above. Aortic Atherosclerosis (ICD10-I70.0). Electronically Signed   By: Ree Molt M.D.   On: 03/09/2024 10:17   CT CHEST W CONTRAST Result Date: 03/09/2024 CLINICAL DATA:  Liver mass on right upper quadrant ultrasound, chest pain. * Tracking Code: BO * EXAM: CT CHEST WITH CONTRAST CT ABDOMEN WITHOUT AND WITH CONTRAST TECHNIQUE: Multidetector CT imaging of the abdomen  was performed without intravenous contrast. Multidetector CT imaging of the chest and abdomen was then performed during bolus administration of intravenous contrast. RADIATION DOSE REDUCTION: This exam was performed according to the departmental dose-optimization program which includes automated exposure control, adjustment of the mA  and/or kV according to patient size and/or use of iterative reconstruction technique. CONTRAST:  75mL OMNIPAQUE  IOHEXOL  350 MG/ML SOLN COMPARISON:  Ultrasound abdomen from earlier the same day. FINDINGS: CT CHEST FINDINGS Cardiovascular: Normal cardiac size. No pericardial effusion. No aortic aneurysm. There are coronary artery calcifications, in keeping with coronary artery disease. Mediastinum/Nodes: Visualized thyroid  gland appears grossly unremarkable. No solid / cystic mediastinal masses. The esophagus is nondistended precluding optimal assessment. No axillary, mediastinal or hilar lymphadenopathy by size criteria. Lungs/Pleura: The central tracheo-bronchial tree is patent. There are dependent changes in bilateral lungs. No mass or consolidation. No pleural effusion or pneumothorax. No suspicious lung nodules. Musculoskeletal: The visualized soft tissues of the chest wall are grossly unremarkable. No suspicious osseous lesions. CT ABDOMEN PELVIS FINDINGS Hepatobiliary: The liver is normal in size. There is liver surface irregularity/nodularity, suggesting cirrhosis cirrhotic appearance due to underlying liver lesions described as follows. There are multiple (more than 15), ill-defined hypoattenuating masses throughout the liver with largest in the left hepatic lobe, segment 4 B/3 junction region measuring up to 3.0 x 3.2 cm. No intrahepatic or extrahepatic bile duct dilation. No calcified gallstones. Normal gallbladder wall thickness. No pericholecystic inflammatory changes. Pancreas: There is an ill-defined, hypoenhancing, approximately 1.6 x 1.9 cm lesion in the pancreatic head, which is highly concerning for pancreatic malignancy. There is resultant moderate upstream main pancreatic duct dilation, measuring up to 8 mm in the neck region. No peripancreatic fat stranding. There is resultant up to moderate narrowing of the portal vein at the level of confluence of SMV/splenic veins (series 7, image 66). Rest of  the main portal vein and its tributaries are otherwise normal in caliber and patent. Unremarkable hepatic veins. There is subtle fat stranding surrounding the pancreatic head/uncinate process and neck region, which is nonspecific but can be seen with acute interstitial pancreatitis. Correlate clinically and with serum lipase levels. Spleen: Within normal limits. No focal lesion. Adrenals/Urinary Tract: There are bilateral adrenal adenomas. On the left it measures up to 1.0 x 1.6 cm and on the right it measures up to 0.9 x 1.2 cm. No suspicious renal mass. There is a 1.3 x 1.6 cm simple cyst arising from the left kidney upper pole. There is a 6 x 10 mm nonobstructing calculus in the left kidney upper pole and an additional 2 mm nonobstructing calculus in the left kidney lower pole. No other nephroureterolithiasis on either side. No obstructive uropathy on either side. Urinary bladder is under distended, precluding optimal assessment. However, no large mass or stones identified. No perivesical fat stranding. Stomach/Bowel: No disproportionate dilation of the small or large bowel loops. No evidence of abnormal bowel wall thickening. There is subtle fat stranding surrounding the proximal duodenum, which is nonspecific but typically seen as sequela of pancreatitis or duodenitis. Correlate clinically. The appendix is unremarkable. Vascular/Lymphatic: No ascites or pneumoperitoneum. No abdominal or pelvic lymphadenopathy, by size criteria. No aneurysmal dilation of the major abdominal arteries. Reproductive: Normal size prostate. Symmetric seminal vesicles. Other: There is a tiny fat containing umbilical hernia. There are bilateral small fat containing inguinal hernias. Musculoskeletal: No suspicious osseous lesions. There are mild multilevel degenerative changes in the visualized spine. IMPRESSION: 1. There is an ill-defined, hypoenhancing, approximately 1.6 x 1.9 cm  lesion in the pancreatic head, which is highly  concerning for pancreatic malignancy. There is resultant moderate upstream main pancreatic duct dilation, measuring up to 8 mm in the neck region. There is also resultant up to moderate narrowing of the portal vein at the level of confluence of SMV/splenic veins. 2. There are multiple (more than 15), ill-defined hypoattenuating masses throughout the liver with largest in the left hepatic lobe, segment 4B/3 junction region measuring up to 3.0 x 3.2 cm. These are compatible with metastases. 3. There is subtle fat stranding surrounding the pancreatic head/uncinate process and neck region, which is nonspecific but can be seen with acute interstitial pancreatitis. There is subtle fat stranding surrounding the proximal duodenum, which is typically seen as sequela of pancreatitis or duodenitis. 4. No metastatic disease identified within the chest. 5. Multiple other nonacute observations, as described above. Aortic Atherosclerosis (ICD10-I70.0). Electronically Signed   By: Ree Molt M.D.   On: 03/09/2024 10:17   US  Abdomen Limited RUQ (LIVER/GB) Result Date: 03/09/2024 CLINICAL DATA:  [id number].  Elevated liver enzymes. EXAM: ULTRASOUND ABDOMEN LIMITED RIGHT UPPER QUADRANT COMPARISON:  Ultrasound complete abdomen from 12/23/2018. FINDINGS: Gallbladder: No gallstones or wall thickening visualized. The gallbladder is mildly dilated measuring 11 cm in length. There is a small amount of hypoechoic layering sludge. No sonographic Murphy sign noted by sonographer. Common bile duct: Diameter: Measures prominent at 7.4 mm mild intrahepatic biliary prominence. Liver: Not seen in 2020, there is a heterogeneously hypoechoic solid mass with scattered color flow in the left lobe measuring 3.5 x 2.9 x 3.3 cm, worrisome for primary or metastatic neoplasm. CT or MRI recommended preferably without and with contrast. Otherwise, the liver is within normal limits in parenchymal echogenicity. Portal vein is patent on color Doppler imaging  with normal direction of blood flow towards the liver. Other: No right upper quadrant ascites. IMPRESSION: 1. 3.5 x 2.9 x 3.3 cm heterogeneously hypoechoic solid mass in the left lobe of the liver, worrisome for primary or metastatic neoplasm. CT or MRI recommended preferably without and with contrast. 2. Mildly dilated gallbladder with a small amount of sludge. No gallstones or wall thickening. 3. Prominent common bile duct at 7.4 mm with mild intrahepatic biliary prominence. Electronically Signed   By: Francis Quam M.D.   On: 03/09/2024 07:46   DG Ribs Unilateral W/Chest Left Result Date: 03/09/2024 CLINICAL DATA:  53 year old male with back and rib pain for 1 month. EXAM: LEFT RIBS AND CHEST - 3+ VIEW COMPARISON:  None Available. FINDINGS: PA view of the chest 0457 hours. Low normal lung volumes. Normal cardiac size and mediastinal contours. Visualized tracheal air column is within normal limits. Both lungs appear clear. No pneumothorax or pleural effusion. Four oblique views of the left ribs. Bone mineralization is within normal limits. 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 EKG leads project over the left chest. No left rib fracture or rib lesion identified. Other visible osseous structures appear intact. Negative visible bowel gas pattern. However, left abdominal oval, crescent-shaped 13 mm calculus is visible on multiple views, suspicious for left nephrolithiasis. IMPRESSION: 1. No acute cardiopulmonary abnormality.  Negative left radiographs. 2. Evidence of bulky 13 mm left renal calculus.  Query renal colic. Electronically Signed   By: VEAR Hurst M.D.   On: 03/09/2024 05:32    ASSESSMENT AND PLAN: Assessment and Plan Suspicious pancreatic mass with liver lesions (possible metastatic pancreatic cancer) A mass in the head of the pancreas and liver lesions suggest possible metastatic pancreatic cancer. He reports epigastric  discomfort and back pain, denies weight loss, jaundice, or changes in urine color.  Differential diagnosis includes pancreatic cancer, pending further investigation. - Perform liver biopsy using ultrasound guidance to confirm diagnosis - Review biopsy results to determine if pancreatic cancer is present - If pancreatic cancer is confirmed, refer to one of my partner in the gastroenterology oncology.  Dr. Cloretta or Dr. Lanny We will arrange for the patient a follow-up appointment on outpatient basis for further detailed discussion of his treatment options.   The patient voices understanding of current disease status and treatment options and is in agreement with the current care plan.  All questions were answered. The patient knows to call the clinic with any problems, questions or concerns. We can certainly see the patient much sooner if necessary.  Thank you so much for allowing me to participate in the care of Raylen E Bouvier. I will continue to follow up the patient with you and assist in his care.  Disclaimer: This note was dictated with voice recognition software. Similar sounding words can inadvertently be transcribed and may not be corrected upon review.   Sherrod MARLA Sherrod March 09, 2024, 3:12 PM

## 2024-03-09 NOTE — Plan of Care (Signed)

## 2024-03-09 NOTE — ED Triage Notes (Signed)
 Pt arrived POV from home c/o back pain x1 month.

## 2024-03-10 ENCOUNTER — Encounter (HOSPITAL_COMMUNITY): Payer: Self-pay | Admitting: Hospitalist

## 2024-03-10 DIAGNOSIS — K858 Other acute pancreatitis without necrosis or infection: Secondary | ICD-10-CM

## 2024-03-10 DIAGNOSIS — I10 Essential (primary) hypertension: Secondary | ICD-10-CM

## 2024-03-10 DIAGNOSIS — E876 Hypokalemia: Secondary | ICD-10-CM

## 2024-03-10 LAB — PROTIME-INR
INR: 1 (ref 0.8–1.2)
Prothrombin Time: 13.8 s (ref 11.4–15.2)

## 2024-03-10 LAB — HIV ANTIBODY (ROUTINE TESTING W REFLEX): HIV Screen 4th Generation wRfx: NONREACTIVE

## 2024-03-10 MED ORDER — HYDRALAZINE HCL 50 MG PO TABS
50.0000 mg | ORAL_TABLET | Freq: Three times a day (TID) | ORAL | Status: DC
  Administered 2024-03-10 – 2024-03-11 (×3): 50 mg via ORAL
  Filled 2024-03-10 (×3): qty 1

## 2024-03-10 MED ORDER — OXYCODONE HCL 5 MG PO TABS
5.0000 mg | ORAL_TABLET | ORAL | Status: DC | PRN
  Administered 2024-03-10 – 2024-03-11 (×2): 5 mg via ORAL
  Filled 2024-03-10 (×2): qty 1

## 2024-03-10 MED ORDER — AMLODIPINE BESYLATE 5 MG PO TABS
5.0000 mg | ORAL_TABLET | Freq: Every day | ORAL | Status: DC
  Administered 2024-03-10: 5 mg via ORAL
  Filled 2024-03-10: qty 1

## 2024-03-10 MED ORDER — KETOROLAC TROMETHAMINE 15 MG/ML IJ SOLN
15.0000 mg | Freq: Once | INTRAMUSCULAR | Status: AC
  Administered 2024-03-10: 15 mg via INTRAVENOUS
  Filled 2024-03-10: qty 1

## 2024-03-10 NOTE — Plan of Care (Signed)
 Pt up and ambulating independently today, Saline locked IV for now.

## 2024-03-10 NOTE — Progress Notes (Signed)
 Progress Note   Patient: Douglas Harper FMW:[id number] DOB: 01-Jul-1970 DOA: 03/09/2024     1 DOS: the patient was seen and examined on 03/10/2024   Brief hospital course:  Douglas Harper is a 53 y.o. male with medical history significant of GERD p/w abd pain and found to have pancreatic head mass with liver masses possible mets and mild pancreatitis findings. Oncology advised IR to get liver lesion biopsy. Admitted to [facility] service for further management.  Assessment and Plan: Pancreatic mass with liver lesions ?mets- Mild pancreatitis- Oncology evaluation appreciated. He will got for IR liver biopsy tomorrow once BP better controlled. NPO past midnight ordered. Abdominal pain better. No nausea. He is started on diet. Hold IV fluids. He will need to follow up with oncology regarding further management once biopsy results finalized.  Hypertension- BP elevated. He is not home meds. Started Norvasc  5 mg, Hydralazine  50 tid for better BP control.  Hypothyroid- Continue home dose synthroid .  Hypokalemia- Replete as needed.     Out of bed to chair. Incentive spirometry. Nursing supportive care. Fall, aspiration precautions. Diet:  Diet Orders (From admission, onward)     Start     Ordered   03/11/24 0001  Diet NPO time specified Except for: Sips with Meds  Diet effective midnight       Question:  Except for  Answer:  Noralyn with Meds   03/10/24 1202   03/09/24 1537  Diet regular Room service appropriate? Yes; Fluid consistency: Thin  Diet effective now       Question Answer Comment  Room service appropriate? Yes   Fluid consistency: Thin      03/09/24 1537           DVT prophylaxis:   Level of care: Med-Surg   Code Status: Full Code  Subjective: Patient is seen and examined today morning. He is sitting on edge of the bed. No abdominal pain. Tolerated diet.  Physical Exam: Vitals:   03/09/24 1353 03/09/24 1628 03/09/24 2211 03/10/24 0808  BP: (!) 175/101 (!) 179/106 (!)  163/102 (!) 164/102  Pulse: (!) 59 61 63 (!) 59  Resp:  18 17 17   Temp:  98.1 F (36.7 C) 98.2 F (36.8 C) 97.8 F (36.6 C)  TempSrc:  Oral Oral Oral  SpO2:  100% 100% 100%  Weight:      Height:        General - Middle aged African American male, no apparent distress HEENT - PERRLA, EOMI, atraumatic head, non tender sinuses. Lung - Clear, no rales, rhonchi, wheezes. Heart - S1, S2 heard, no murmurs, rubs, no pedal edema. Abdomen - Soft, non tender, bowel sounds good Neuro - Alert, awake and oriented x 3, non focal exam. Skin - Warm and dry.  Data Reviewed:      Latest Ref Rng & Units 03/09/2024    5:15 AM 07/20/2022    7:09 AM 05/18/2021    4:13 PM  CBC  WBC 4.0 - 10.5 K/uL 4.8  5.3  5.2   Hemoglobin 13.0 - 17.0 g/dL 87.9  87.0  87.6   Hematocrit 39.0 - 52.0 % 38.0  40.6  39.9   Platelets 150 - 400 K/uL 169  152.0  161.0       Latest Ref Rng & Units 03/09/2024    5:15 AM 07/20/2022    7:09 AM 05/18/2021    4:13 PM  BMP  Glucose 70 - 99 mg/dL 889  87  84   BUN  6 - 20 mg/dL 10  12  18    Creatinine 0.61 - 1.24 mg/dL 8.66  8.67  8.81   Sodium 135 - 145 mmol/L 138  141  140   Potassium 3.5 - 5.1 mmol/L 3.4  4.1  3.9   Chloride 98 - 111 mmol/L 101  104  104   CO2 22 - 32 mmol/L 26  30  30    Calcium 8.9 - 10.3 mg/dL 9.4  9.3  9.9    CT ABDOMEN PELVIS W WO CONTRAST Result Date: 03/09/2024 CLINICAL DATA:  Liver mass on right upper quadrant ultrasound, chest pain. * Tracking Code: BO * EXAM: CT CHEST WITH CONTRAST CT ABDOMEN WITHOUT AND WITH CONTRAST TECHNIQUE: Multidetector CT imaging of the abdomen was performed without intravenous contrast. Multidetector CT imaging of the chest and abdomen was then performed during bolus administration of intravenous contrast. RADIATION DOSE REDUCTION: This exam was performed according to the departmental dose-optimization program which includes automated exposure control, adjustment of the mA and/or kV according to patient size and/or use of  iterative reconstruction technique. CONTRAST:  75mL OMNIPAQUE  IOHEXOL  350 MG/ML SOLN COMPARISON:  Ultrasound abdomen from earlier the same day. FINDINGS: CT CHEST FINDINGS Cardiovascular: Normal cardiac size. No pericardial effusion. No aortic aneurysm. There are coronary artery calcifications, in keeping with coronary artery disease. Mediastinum/Nodes: Visualized thyroid  gland appears grossly unremarkable. No solid / cystic mediastinal masses. The esophagus is nondistended precluding optimal assessment. No axillary, mediastinal or hilar lymphadenopathy by size criteria. Lungs/Pleura: The central tracheo-bronchial tree is patent. There are dependent changes in bilateral lungs. No mass or consolidation. No pleural effusion or pneumothorax. No suspicious lung nodules. Musculoskeletal: The visualized soft tissues of the chest wall are grossly unremarkable. No suspicious osseous lesions. CT ABDOMEN PELVIS FINDINGS Hepatobiliary: The liver is normal in size. There is liver surface irregularity/nodularity, suggesting cirrhosis cirrhotic appearance due to underlying liver lesions described as follows. There are multiple (more than 15), ill-defined hypoattenuating masses throughout the liver with largest in the left hepatic lobe, segment 4 B/3 junction region measuring up to 3.0 x 3.2 cm. No intrahepatic or extrahepatic bile duct dilation. No calcified gallstones. Normal gallbladder wall thickness. No pericholecystic inflammatory changes. Pancreas: There is an ill-defined, hypoenhancing, approximately 1.6 x 1.9 cm lesion in the pancreatic head, which is highly concerning for pancreatic malignancy. There is resultant moderate upstream main pancreatic duct dilation, measuring up to 8 mm in the neck region. No peripancreatic fat stranding. There is resultant up to moderate narrowing of the portal vein at the level of confluence of SMV/splenic veins (series 7, image 66). Rest of the main portal vein and its tributaries are  otherwise normal in caliber and patent. Unremarkable hepatic veins. There is subtle fat stranding surrounding the pancreatic head/uncinate process and neck region, which is nonspecific but can be seen with acute interstitial pancreatitis. Correlate clinically and with serum lipase levels. Spleen: Within normal limits. No focal lesion. Adrenals/Urinary Tract: There are bilateral adrenal adenomas. On the left it measures up to 1.0 x 1.6 cm and on the right it measures up to 0.9 x 1.2 cm. No suspicious renal mass. There is a 1.3 x 1.6 cm simple cyst arising from the left kidney upper pole. There is a 6 x 10 mm nonobstructing calculus in the left kidney upper pole and an additional 2 mm nonobstructing calculus in the left kidney lower pole. No other nephroureterolithiasis on either side. No obstructive uropathy on either side. Urinary bladder is under distended, precluding optimal assessment.  However, no large mass or stones identified. No perivesical fat stranding. Stomach/Bowel: No disproportionate dilation of the small or large bowel loops. No evidence of abnormal bowel wall thickening. There is subtle fat stranding surrounding the proximal duodenum, which is nonspecific but typically seen as sequela of pancreatitis or duodenitis. Correlate clinically. The appendix is unremarkable. Vascular/Lymphatic: No ascites or pneumoperitoneum. No abdominal or pelvic lymphadenopathy, by size criteria. No aneurysmal dilation of the major abdominal arteries. Reproductive: Normal size prostate. Symmetric seminal vesicles. Other: There is a tiny fat containing umbilical hernia. There are bilateral small fat containing inguinal hernias. Musculoskeletal: No suspicious osseous lesions. There are mild multilevel degenerative changes in the visualized spine. IMPRESSION: 1. There is an ill-defined, hypoenhancing, approximately 1.6 x 1.9 cm lesion in the pancreatic head, which is highly concerning for pancreatic malignancy. There is  resultant moderate upstream main pancreatic duct dilation, measuring up to 8 mm in the neck region. There is also resultant up to moderate narrowing of the portal vein at the level of confluence of SMV/splenic veins. 2. There are multiple (more than 15), ill-defined hypoattenuating masses throughout the liver with largest in the left hepatic lobe, segment 4B/3 junction region measuring up to 3.0 x 3.2 cm. These are compatible with metastases. 3. There is subtle fat stranding surrounding the pancreatic head/uncinate process and neck region, which is nonspecific but can be seen with acute interstitial pancreatitis. There is subtle fat stranding surrounding the proximal duodenum, which is typically seen as sequela of pancreatitis or duodenitis. 4. No metastatic disease identified within the chest. 5. Multiple other nonacute observations, as described above. Aortic Atherosclerosis (ICD10-I70.0). Electronically Signed   By: Ree Molt M.D.   On: 03/09/2024 10:17   CT CHEST W CONTRAST Result Date: 03/09/2024 CLINICAL DATA:  Liver mass on right upper quadrant ultrasound, chest pain. * Tracking Code: BO * EXAM: CT CHEST WITH CONTRAST CT ABDOMEN WITHOUT AND WITH CONTRAST TECHNIQUE: Multidetector CT imaging of the abdomen was performed without intravenous contrast. Multidetector CT imaging of the chest and abdomen was then performed during bolus administration of intravenous contrast. RADIATION DOSE REDUCTION: This exam was performed according to the departmental dose-optimization program which includes automated exposure control, adjustment of the mA and/or kV according to patient size and/or use of iterative reconstruction technique. CONTRAST:  75mL OMNIPAQUE  IOHEXOL  350 MG/ML SOLN COMPARISON:  Ultrasound abdomen from earlier the same day. FINDINGS: CT CHEST FINDINGS Cardiovascular: Normal cardiac size. No pericardial effusion. No aortic aneurysm. There are coronary artery calcifications, in keeping with coronary  artery disease. Mediastinum/Nodes: Visualized thyroid  gland appears grossly unremarkable. No solid / cystic mediastinal masses. The esophagus is nondistended precluding optimal assessment. No axillary, mediastinal or hilar lymphadenopathy by size criteria. Lungs/Pleura: The central tracheo-bronchial tree is patent. There are dependent changes in bilateral lungs. No mass or consolidation. No pleural effusion or pneumothorax. No suspicious lung nodules. Musculoskeletal: The visualized soft tissues of the chest wall are grossly unremarkable. No suspicious osseous lesions. CT ABDOMEN PELVIS FINDINGS Hepatobiliary: The liver is normal in size. There is liver surface irregularity/nodularity, suggesting cirrhosis cirrhotic appearance due to underlying liver lesions described as follows. There are multiple (more than 15), ill-defined hypoattenuating masses throughout the liver with largest in the left hepatic lobe, segment 4 B/3 junction region measuring up to 3.0 x 3.2 cm. No intrahepatic or extrahepatic bile duct dilation. No calcified gallstones. Normal gallbladder wall thickness. No pericholecystic inflammatory changes. Pancreas: There is an ill-defined, hypoenhancing, approximately 1.6 x 1.9 cm lesion in the pancreatic head,  which is highly concerning for pancreatic malignancy. There is resultant moderate upstream main pancreatic duct dilation, measuring up to 8 mm in the neck region. No peripancreatic fat stranding. There is resultant up to moderate narrowing of the portal vein at the level of confluence of SMV/splenic veins (series 7, image 66). Rest of the main portal vein and its tributaries are otherwise normal in caliber and patent. Unremarkable hepatic veins. There is subtle fat stranding surrounding the pancreatic head/uncinate process and neck region, which is nonspecific but can be seen with acute interstitial pancreatitis. Correlate clinically and with serum lipase levels. Spleen: Within normal limits. No  focal lesion. Adrenals/Urinary Tract: There are bilateral adrenal adenomas. On the left it measures up to 1.0 x 1.6 cm and on the right it measures up to 0.9 x 1.2 cm. No suspicious renal mass. There is a 1.3 x 1.6 cm simple cyst arising from the left kidney upper pole. There is a 6 x 10 mm nonobstructing calculus in the left kidney upper pole and an additional 2 mm nonobstructing calculus in the left kidney lower pole. No other nephroureterolithiasis on either side. No obstructive uropathy on either side. Urinary bladder is under distended, precluding optimal assessment. However, no large mass or stones identified. No perivesical fat stranding. Stomach/Bowel: No disproportionate dilation of the small or large bowel loops. No evidence of abnormal bowel wall thickening. There is subtle fat stranding surrounding the proximal duodenum, which is nonspecific but typically seen as sequela of pancreatitis or duodenitis. Correlate clinically. The appendix is unremarkable. Vascular/Lymphatic: No ascites or pneumoperitoneum. No abdominal or pelvic lymphadenopathy, by size criteria. No aneurysmal dilation of the major abdominal arteries. Reproductive: Normal size prostate. Symmetric seminal vesicles. Other: There is a tiny fat containing umbilical hernia. There are bilateral small fat containing inguinal hernias. Musculoskeletal: No suspicious osseous lesions. There are mild multilevel degenerative changes in the visualized spine. IMPRESSION: 1. There is an ill-defined, hypoenhancing, approximately 1.6 x 1.9 cm lesion in the pancreatic head, which is highly concerning for pancreatic malignancy. There is resultant moderate upstream main pancreatic duct dilation, measuring up to 8 mm in the neck region. There is also resultant up to moderate narrowing of the portal vein at the level of confluence of SMV/splenic veins. 2. There are multiple (more than 15), ill-defined hypoattenuating masses throughout the liver with largest in  the left hepatic lobe, segment 4B/3 junction region measuring up to 3.0 x 3.2 cm. These are compatible with metastases. 3. There is subtle fat stranding surrounding the pancreatic head/uncinate process and neck region, which is nonspecific but can be seen with acute interstitial pancreatitis. There is subtle fat stranding surrounding the proximal duodenum, which is typically seen as sequela of pancreatitis or duodenitis. 4. No metastatic disease identified within the chest. 5. Multiple other nonacute observations, as described above. Aortic Atherosclerosis (ICD10-I70.0). Electronically Signed   By: Ree Molt M.D.   On: 03/09/2024 10:17   US  Abdomen Limited RUQ (LIVER/GB) Result Date: 03/09/2024 CLINICAL DATA:  [id number].  Elevated liver enzymes. EXAM: ULTRASOUND ABDOMEN LIMITED RIGHT UPPER QUADRANT COMPARISON:  Ultrasound complete abdomen from 12/23/2018. FINDINGS: Gallbladder: No gallstones or wall thickening visualized. The gallbladder is mildly dilated measuring 11 cm in length. There is a small amount of hypoechoic layering sludge. No sonographic Murphy sign noted by sonographer. Common bile duct: Diameter: Measures prominent at 7.4 mm mild intrahepatic biliary prominence. Liver: Not seen in 2020, there is a heterogeneously hypoechoic solid mass with scattered color flow in the left lobe measuring  3.5 x 2.9 x 3.3 cm, worrisome for primary or metastatic neoplasm. CT or MRI recommended preferably without and with contrast. Otherwise, the liver is within normal limits in parenchymal echogenicity. Portal vein is patent on color Doppler imaging with normal direction of blood flow towards the liver. Other: No right upper quadrant ascites. IMPRESSION: 1. 3.5 x 2.9 x 3.3 cm heterogeneously hypoechoic solid mass in the left lobe of the liver, worrisome for primary or metastatic neoplasm. CT or MRI recommended preferably without and with contrast. 2. Mildly dilated gallbladder with a small amount of sludge. No  gallstones or wall thickening. 3. Prominent common bile duct at 7.4 mm with mild intrahepatic biliary prominence. Electronically Signed   By: Francis Quam M.D.   On: 03/09/2024 07:46   DG Ribs Unilateral W/Chest Left Result Date: 03/09/2024 CLINICAL DATA:  53 year old male with back and rib pain for 1 month. EXAM: LEFT RIBS AND CHEST - 3+ VIEW COMPARISON:  None Available. FINDINGS: PA view of the chest 0457 hours. Low normal lung volumes. Normal cardiac size and mediastinal contours. Visualized tracheal air column is within normal limits. Both lungs appear clear. No pneumothorax or pleural effusion. Four oblique views of the left ribs. Bone mineralization is within normal limits. 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 EKG leads project over the left chest. No left rib fracture or rib lesion identified. Other visible osseous structures appear intact. Negative visible bowel gas pattern. However, left abdominal oval, crescent-shaped 13 mm calculus is visible on multiple views, suspicious for left nephrolithiasis. IMPRESSION: 1. No acute cardiopulmonary abnormality.  Negative left radiographs. 2. Evidence of bulky 13 mm left renal calculus.  Query renal colic. Electronically Signed   By: VEAR Hurst M.D.   On: 03/09/2024 05:32    Family Communication: Discussed with patient, understand and agree. All questions answered.  Disposition: Status is: Inpatient Remains inpatient appropriate because: IR liver biopsy, advance diet.  Planned Discharge Destination: Home     Time spent: 44 minutes  Author: Concepcion Riser, MD 03/10/2024 1:19 PM Secure chat 7am to 7pm For on call review [email or web address].

## 2024-03-10 NOTE — Progress Notes (Signed)
 Notified Dr. Darci of elevated BP in spite of giving pt 2 BP meds earlier today. Pt states he thinks it is related to anxiousness of being in the hospital. He is having some pain so additional pain meds requested.

## 2024-03-10 NOTE — Consult Note (Signed)
 Chief Complaint: Patient was seen in consultation today for liver lesion  Chief Complaint  Patient presents with   Back Pain   at the request of Sherrod Sherrod   Referring Physician(s): Sherrod Sherrod   Supervising Physician: Luverne Aran  Patient Status: Douglas Harper - In-pt  History of Present Illness: Douglas Harper is a 53 y.o. male with PMHs of hyperthyroidism s/p RAI in 2009 now with hypothyroidism, GERD who was found to have acute pancreatitis, pancreatic head mass and liver lesion IR was consulted for biopsy.   Patient came to ED on 9/21 due to abd pain, work up showed  acute pancreatitis, pancreatic head mass and liver lesion. He was admitted for further eval and management and seen by oncology who recommended liver lesion bx. Case reviewed and approved for US  guided liver lesion biopsy.   Patient laying in bed, not in acute distress.  Denise headache, fever, chills, shortness of breath, cough, chest pain, abdominal pain, nausea ,vomiting, and bleeding.   Past Medical History:  Diagnosis Date   COVID-19    03/17/20   FH: heart disease    GERD (gastroesophageal reflux disease)    Glaucoma    Hyperthyroidism    s/p RAI 2009    Hypothyroidism    Verruca vulgaris     Past Surgical History:  Procedure Laterality Date   ANTERIOR CRUCIATE LIGAMENT REPAIR Right 2000   HAND SURGERY Right 2001   with hardware implanted   NO PAST SURGERIES      Allergies: Patient has no known allergies.  Medications: Prior to Admission medications   Medication Sig Start Date End Date Taking? Authorizing Provider  levothyroxine  (SYNTHROID ) 125 MCG tablet TAKE 1 TABLET BY MOUTH 30 MINUTES BEFORE BREAKFAST (STOP DOSE) 12/14/23 12/13/24 Yes Mercer Clotilda SAUNDERS, MD  omeprazole (PRILOSEC) 40 MG capsule Take 40 mg by mouth daily. 02/29/24  Yes [provider]     Family History  Problem Relation Age of Onset   Heart disease Mother    Hypertension Mother    Diabetes Mother     Colon cancer Neg Hx    Esophageal cancer Neg Hx    Pancreatic cancer Neg Hx    Liver disease Neg Hx    Rectal cancer Neg Hx    Stomach cancer Neg Hx     Social History   Socioeconomic History   Marital status: Single    Spouse name: Not on file   Number of children: Not on file   Years of education: Not on file   Highest education level: Some college, no degree  Occupational History   Not on file  Tobacco Use   Smoking status: Never   Smokeless tobacco: Never  Substance and Sexual Activity   Alcohol use: Yes    Comment: socially   Drug use: Never   Sexual activity: Not on file  Other Topics Concern   Not on file  Social History Narrative   Married wife Douglas Harper DPR    From [place] been here since 53 y.o    Works Product/process development scientist [employer]    No tobacco, drugs, occas. etoh    8 siblings   Social Drivers of Corporate investment banker Strain: Medium Risk (04/25/2022)   Overall Financial Resource Strain (CARDIA)    Difficulty of Paying Living Expenses: Somewhat hard  Food Insecurity: No Food Insecurity (03/09/2024)   Hunger Vital Sign    Worried About Running Out of Food in the Last Year:  Never true    Ran Out of Food in the Last Year: Never true  Transportation Needs: No Transportation Needs (03/09/2024)   PRAPARE - Administrator, Civil Service (Medical): No    Lack of Transportation (Non-Medical): No  Physical Activity: Sufficiently Active (04/25/2022)   Exercise Vital Sign    Days of Exercise per Week: 6 days    Minutes of Exercise per Session: 30 min  Stress: No Stress Concern Present (04/25/2022)   [employer] of Occupational Health - Occupational Stress Questionnaire    Feeling of Stress : Not at all  Social Connections: Moderately Isolated (04/25/2022)   Social Connection and Isolation Panel    Frequency of Communication with Friends and Family: More than three times a week    Frequency of Social Gatherings with Friends and Family:  More than three times a week    Attends Religious Services: Never    Database administrator or Organizations: No    Attends Engineer, structural: Not on file    Marital Status: Living with partner     Review of Systems: A 12 point ROS discussed and pertinent positives are indicated in the HPI above.  All other systems are negative.  Vital Signs: BP (!) 164/102 (BP Location: Left Arm)   Pulse (!) 59   Temp 97.8 F (36.6 C) (Oral)   Resp 17   Ht 6' 4 (1.93 m)   Wt 207 lb (93.9 kg)   SpO2 100%   BMI 25.20 kg/m    Physical Exam Vitals and nursing note reviewed.  Constitutional:      General: Patient is not in acute distress.    Appearance: Normal appearance. Patient is not ill-appearing.  HENT:     Head: Normocephalic and atraumatic.     Mouth/Throat:     Mouth: Mucous membranes are moist.     Pharynx: Oropharynx is clear.  Cardiovascular:     Rate and Rhythm: Normal rate and regular rhythm.     Pulses: Normal pulses.     Heart sounds: Normal heart sounds.  Pulmonary:     Effort: Pulmonary effort is normal.     Breath sounds: Normal breath sounds.  Abdominal:     General: Abdomen is flat. Bowel sounds are normal.     Palpations: Abdomen is soft.  Musculoskeletal:     Cervical back: Neck supple.  Skin:    General: Skin is warm and dry.     Coloration: Skin is not jaundiced or pale.  Neurological:     Mental Status: Patient is alert and oriented to person, place, and time.  Psychiatric:        Mood and Affect: Mood normal.        Behavior: Behavior normal.        Judgment: Judgment normal.    MD Evaluation Airway: WNL Heart: WNL Abdomen: WNL Chest/ Lungs: WNL ASA  Classification: 3 Mallampati/Airway Score: One  Imaging: CT ABDOMEN PELVIS W WO CONTRAST Result Date: 03/09/2024 CLINICAL DATA:  Liver mass on right upper quadrant ultrasound, chest pain. * Tracking Code: BO * EXAM: CT CHEST WITH CONTRAST CT ABDOMEN WITHOUT AND WITH CONTRAST TECHNIQUE:  Multidetector CT imaging of the abdomen was performed without intravenous contrast. Multidetector CT imaging of the chest and abdomen was then performed during bolus administration of intravenous contrast. RADIATION DOSE REDUCTION: This exam was performed according to the departmental dose-optimization program which includes automated exposure control, adjustment of the mA and/or kV according to  patient size and/or use of iterative reconstruction technique. CONTRAST:  75mL OMNIPAQUE  IOHEXOL  350 MG/ML SOLN COMPARISON:  Ultrasound abdomen from earlier the same day. FINDINGS: CT CHEST FINDINGS Cardiovascular: Normal cardiac size. No pericardial effusion. No aortic aneurysm. There are coronary artery calcifications, in keeping with coronary artery disease. Mediastinum/Nodes: Visualized thyroid  gland appears grossly unremarkable. No solid / cystic mediastinal masses. The esophagus is nondistended precluding optimal assessment. No axillary, mediastinal or hilar lymphadenopathy by size criteria. Lungs/Pleura: The central tracheo-bronchial tree is patent. There are dependent changes in bilateral lungs. No mass or consolidation. No pleural effusion or pneumothorax. No suspicious lung nodules. Musculoskeletal: The visualized soft tissues of the chest wall are grossly unremarkable. No suspicious osseous lesions. CT ABDOMEN PELVIS FINDINGS Hepatobiliary: The liver is normal in size. There is liver surface irregularity/nodularity, suggesting cirrhosis cirrhotic appearance due to underlying liver lesions described as follows. There are multiple (more than 15), ill-defined hypoattenuating masses throughout the liver with largest in the left hepatic lobe, segment 4 B/3 junction region measuring up to 3.0 x 3.2 cm. No intrahepatic or extrahepatic bile duct dilation. No calcified gallstones. Normal gallbladder wall thickness. No pericholecystic inflammatory changes. Pancreas: There is an ill-defined, hypoenhancing, approximately 1.6  x 1.9 cm lesion in the pancreatic head, which is highly concerning for pancreatic malignancy. There is resultant moderate upstream main pancreatic duct dilation, measuring up to 8 mm in the neck region. No peripancreatic fat stranding. There is resultant up to moderate narrowing of the portal vein at the level of confluence of SMV/splenic veins (series 7, image 66). Rest of the main portal vein and its tributaries are otherwise normal in caliber and patent. Unremarkable hepatic veins. There is subtle fat stranding surrounding the pancreatic head/uncinate process and neck region, which is nonspecific but can be seen with acute interstitial pancreatitis. Correlate clinically and with serum lipase levels. Spleen: Within normal limits. No focal lesion. Adrenals/Urinary Tract: There are bilateral adrenal adenomas. On the left it measures up to 1.0 x 1.6 cm and on the right it measures up to 0.9 x 1.2 cm. No suspicious renal mass. There is a 1.3 x 1.6 cm simple cyst arising from the left kidney upper pole. There is a 6 x 10 mm nonobstructing calculus in the left kidney upper pole and an additional 2 mm nonobstructing calculus in the left kidney lower pole. No other nephroureterolithiasis on either side. No obstructive uropathy on either side. Urinary bladder is under distended, precluding optimal assessment. However, no large mass or stones identified. No perivesical fat stranding. Stomach/Bowel: No disproportionate dilation of the small or large bowel loops. No evidence of abnormal bowel wall thickening. There is subtle fat stranding surrounding the proximal duodenum, which is nonspecific but typically seen as sequela of pancreatitis or duodenitis. Correlate clinically. The appendix is unremarkable. Vascular/Lymphatic: No ascites or pneumoperitoneum. No abdominal or pelvic lymphadenopathy, by size criteria. No aneurysmal dilation of the major abdominal arteries. Reproductive: Normal size prostate. Symmetric seminal  vesicles. Other: There is a tiny fat containing umbilical hernia. There are bilateral small fat containing inguinal hernias. Musculoskeletal: No suspicious osseous lesions. There are mild multilevel degenerative changes in the visualized spine. IMPRESSION: 1. There is an ill-defined, hypoenhancing, approximately 1.6 x 1.9 cm lesion in the pancreatic head, which is highly concerning for pancreatic malignancy. There is resultant moderate upstream main pancreatic duct dilation, measuring up to 8 mm in the neck region. There is also resultant up to moderate narrowing of the portal vein at the level of confluence of  SMV/splenic veins. 2. There are multiple (more than 15), ill-defined hypoattenuating masses throughout the liver with largest in the left hepatic lobe, segment 4B/3 junction region measuring up to 3.0 x 3.2 cm. These are compatible with metastases. 3. There is subtle fat stranding surrounding the pancreatic head/uncinate process and neck region, which is nonspecific but can be seen with acute interstitial pancreatitis. There is subtle fat stranding surrounding the proximal duodenum, which is typically seen as sequela of pancreatitis or duodenitis. 4. No metastatic disease identified within the chest. 5. Multiple other nonacute observations, as described above. Aortic Atherosclerosis (ICD10-I70.0). Electronically Signed   By: Ree Molt M.D.   On: 03/09/2024 10:17   CT CHEST W CONTRAST Result Date: 03/09/2024 CLINICAL DATA:  Liver mass on right upper quadrant ultrasound, chest pain. * Tracking Code: BO * EXAM: CT CHEST WITH CONTRAST CT ABDOMEN WITHOUT AND WITH CONTRAST TECHNIQUE: Multidetector CT imaging of the abdomen was performed without intravenous contrast. Multidetector CT imaging of the chest and abdomen was then performed during bolus administration of intravenous contrast. RADIATION DOSE REDUCTION: This exam was performed according to the departmental dose-optimization program which includes  automated exposure control, adjustment of the mA and/or kV according to patient size and/or use of iterative reconstruction technique. CONTRAST:  75mL OMNIPAQUE  IOHEXOL  350 MG/ML SOLN COMPARISON:  Ultrasound abdomen from earlier the same day. FINDINGS: CT CHEST FINDINGS Cardiovascular: Normal cardiac size. No pericardial effusion. No aortic aneurysm. There are coronary artery calcifications, in keeping with coronary artery disease. Mediastinum/Nodes: Visualized thyroid  gland appears grossly unremarkable. No solid / cystic mediastinal masses. The esophagus is nondistended precluding optimal assessment. No axillary, mediastinal or hilar lymphadenopathy by size criteria. Lungs/Pleura: The central tracheo-bronchial tree is patent. There are dependent changes in bilateral lungs. No mass or consolidation. No pleural effusion or pneumothorax. No suspicious lung nodules. Musculoskeletal: The visualized soft tissues of the chest wall are grossly unremarkable. No suspicious osseous lesions. CT ABDOMEN PELVIS FINDINGS Hepatobiliary: The liver is normal in size. There is liver surface irregularity/nodularity, suggesting cirrhosis cirrhotic appearance due to underlying liver lesions described as follows. There are multiple (more than 15), ill-defined hypoattenuating masses throughout the liver with largest in the left hepatic lobe, segment 4 B/3 junction region measuring up to 3.0 x 3.2 cm. No intrahepatic or extrahepatic bile duct dilation. No calcified gallstones. Normal gallbladder wall thickness. No pericholecystic inflammatory changes. Pancreas: There is an ill-defined, hypoenhancing, approximately 1.6 x 1.9 cm lesion in the pancreatic head, which is highly concerning for pancreatic malignancy. There is resultant moderate upstream main pancreatic duct dilation, measuring up to 8 mm in the neck region. No peripancreatic fat stranding. There is resultant up to moderate narrowing of the portal vein at the level of confluence  of SMV/splenic veins (series 7, image 66). Rest of the main portal vein and its tributaries are otherwise normal in caliber and patent. Unremarkable hepatic veins. There is subtle fat stranding surrounding the pancreatic head/uncinate process and neck region, which is nonspecific but can be seen with acute interstitial pancreatitis. Correlate clinically and with serum lipase levels. Spleen: Within normal limits. No focal lesion. Adrenals/Urinary Tract: There are bilateral adrenal adenomas. On the left it measures up to 1.0 x 1.6 cm and on the right it measures up to 0.9 x 1.2 cm. No suspicious renal mass. There is a 1.3 x 1.6 cm simple cyst arising from the left kidney upper pole. There is a 6 x 10 mm nonobstructing calculus in the left kidney upper pole and an  additional 2 mm nonobstructing calculus in the left kidney lower pole. No other nephroureterolithiasis on either side. No obstructive uropathy on either side. Urinary bladder is under distended, precluding optimal assessment. However, no large mass or stones identified. No perivesical fat stranding. Stomach/Bowel: No disproportionate dilation of the small or large bowel loops. No evidence of abnormal bowel wall thickening. There is subtle fat stranding surrounding the proximal duodenum, which is nonspecific but typically seen as sequela of pancreatitis or duodenitis. Correlate clinically. The appendix is unremarkable. Vascular/Lymphatic: No ascites or pneumoperitoneum. No abdominal or pelvic lymphadenopathy, by size criteria. No aneurysmal dilation of the major abdominal arteries. Reproductive: Normal size prostate. Symmetric seminal vesicles. Other: There is a tiny fat containing umbilical hernia. There are bilateral small fat containing inguinal hernias. Musculoskeletal: No suspicious osseous lesions. There are mild multilevel degenerative changes in the visualized spine. IMPRESSION: 1. There is an ill-defined, hypoenhancing, approximately 1.6 x 1.9 cm  lesion in the pancreatic head, which is highly concerning for pancreatic malignancy. There is resultant moderate upstream main pancreatic duct dilation, measuring up to 8 mm in the neck region. There is also resultant up to moderate narrowing of the portal vein at the level of confluence of SMV/splenic veins. 2. There are multiple (more than 15), ill-defined hypoattenuating masses throughout the liver with largest in the left hepatic lobe, segment 4B/3 junction region measuring up to 3.0 x 3.2 cm. These are compatible with metastases. 3. There is subtle fat stranding surrounding the pancreatic head/uncinate process and neck region, which is nonspecific but can be seen with acute interstitial pancreatitis. There is subtle fat stranding surrounding the proximal duodenum, which is typically seen as sequela of pancreatitis or duodenitis. 4. No metastatic disease identified within the chest. 5. Multiple other nonacute observations, as described above. Aortic Atherosclerosis (ICD10-I70.0). Electronically Signed   By: Ree Molt M.D.   On: 03/09/2024 10:17   US  Abdomen Limited RUQ (LIVER/GB) Result Date: 03/09/2024 CLINICAL DATA:  [id number].  Elevated liver enzymes. EXAM: ULTRASOUND ABDOMEN LIMITED RIGHT UPPER QUADRANT COMPARISON:  Ultrasound complete abdomen from 12/23/2018. FINDINGS: Gallbladder: No gallstones or wall thickening visualized. The gallbladder is mildly dilated measuring 11 cm in length. There is a small amount of hypoechoic layering sludge. No sonographic Murphy sign noted by sonographer. Common bile duct: Diameter: Measures prominent at 7.4 mm mild intrahepatic biliary prominence. Liver: Not seen in 2020, there is a heterogeneously hypoechoic solid mass with scattered color flow in the left lobe measuring 3.5 x 2.9 x 3.3 cm, worrisome for primary or metastatic neoplasm. CT or MRI recommended preferably without and with contrast. Otherwise, the liver is within normal limits in parenchymal echogenicity.  Portal vein is patent on color Doppler imaging with normal direction of blood flow towards the liver. Other: No right upper quadrant ascites. IMPRESSION: 1. 3.5 x 2.9 x 3.3 cm heterogeneously hypoechoic solid mass in the left lobe of the liver, worrisome for primary or metastatic neoplasm. CT or MRI recommended preferably without and with contrast. 2. Mildly dilated gallbladder with a small amount of sludge. No gallstones or wall thickening. 3. Prominent common bile duct at 7.4 mm with mild intrahepatic biliary prominence. Electronically Signed   By: Francis Quam M.D.   On: 03/09/2024 07:46   DG Ribs Unilateral W/Chest Left Result Date: 03/09/2024 CLINICAL DATA:  53 year old male with back and rib pain for 1 month. EXAM: LEFT RIBS AND CHEST - 3+ VIEW COMPARISON:  None Available. FINDINGS: PA view of the chest 0457 hours. Low normal lung  volumes. Normal cardiac size and mediastinal contours. Visualized tracheal air column is within normal limits. Both lungs appear clear. No pneumothorax or pleural effusion. Four oblique views of the left ribs. Bone mineralization is within normal limits. 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 EKG leads project over the left chest. No left rib fracture or rib lesion identified. Other visible osseous structures appear intact. Negative visible bowel gas pattern. However, left abdominal oval, crescent-shaped 13 mm calculus is visible on multiple views, suspicious for left nephrolithiasis. IMPRESSION: 1. No acute cardiopulmonary abnormality.  Negative left radiographs. 2. Evidence of bulky 13 mm left renal calculus.  Query renal colic. Electronically Signed   By: VEAR Hurst M.D.   On: 03/09/2024 05:32    Labs:  CBC: Recent Labs    03/09/24 0515  WBC 4.8  HGB 12.0*  HCT 38.0*  PLT 169    COAGS: Recent Labs    03/10/24 0604  INR 1.0    BMP: Recent Labs    03/09/24 0515  NA 138  K 3.4*  CL 101  CO2 26  GLUCOSE 110*  BUN 10  CALCIUM 9.4  CREATININE 1.33*   GFRNONAA >60    LIVER FUNCTION TESTS: Recent Labs    03/09/24 0515  BILITOT 1.5*  AST 108*  ALT 135*  ALKPHOS 130*  PROT 7.5  ALBUMIN 3.9    TUMOR MARKERS: No results for input(s): AFPTM, CEA, CA199, CHROMGRNA in the last 8760 hours.  Assessment and Plan: 53 y.o. male with pancreatic mass and liver lesion who presents for liver lesion biopsy.   VS hypertensive 164/102 - primary team notified to bring the BP down before bx CBC stable  INR 1.0 on 9/22 Not on AC/AP NKDA  Risks and benefits of liver lesion biopsy was discussed with the patient and/or patient's family including, but not limited to bleeding, infection, damage to adjacent structures or low yield requiring additional tests.  All of the questions were answered and there is agreement to proceed.  Consent signed and in chart.    Thank you for this interesting consult.  I greatly enjoyed meeting TYDUS SANMIGUEL and look forward to participating in their care.  A copy of this report was sent to the requesting provider on this date.  Electronically Signed: Toya VEAR Cousin, PA-C 03/10/2024, 10:28 AM   I spent a total of 40 Minutes    in face to face in clinical consultation, greater than 50% of which was counseling/coordinating care for liver lesion biopsy.   This chart was dictated using voice recognition software.  Despite best efforts to proofread,  errors can occur which can change the documentation meaning.

## 2024-03-11 ENCOUNTER — Inpatient Hospital Stay (HOSPITAL_COMMUNITY)

## 2024-03-11 LAB — CBC
HCT: 37.8 % — ABNORMAL LOW (ref 39.0–52.0)
Hemoglobin: 12.1 g/dL — ABNORMAL LOW (ref 13.0–17.0)
MCH: 20.2 pg — ABNORMAL LOW (ref 26.0–34.0)
MCHC: 32 g/dL (ref 30.0–36.0)
MCV: 63.1 fL — ABNORMAL LOW (ref 80.0–100.0)
Platelets: 160 K/uL (ref 150–400)
RBC: 5.99 MIL/uL — ABNORMAL HIGH (ref 4.22–5.81)
RDW: 17.1 % — ABNORMAL HIGH (ref 11.5–15.5)
WBC: 4.9 K/uL (ref 4.0–10.5)
nRBC: 0 % (ref 0.0–0.2)

## 2024-03-11 LAB — COMPREHENSIVE METABOLIC PANEL WITH GFR
ALT: 290 U/L — ABNORMAL HIGH (ref 0–44)
AST: 339 U/L — ABNORMAL HIGH (ref 15–41)
Albumin: 3.3 g/dL — ABNORMAL LOW (ref 3.5–5.0)
Alkaline Phosphatase: 164 U/L — ABNORMAL HIGH (ref 38–126)
Anion gap: 12 (ref 5–15)
BUN: 12 mg/dL (ref 6–20)
CO2: 25 mmol/L (ref 22–32)
Calcium: 8.9 mg/dL (ref 8.9–10.3)
Chloride: 100 mmol/L (ref 98–111)
Creatinine, Ser: 1.16 mg/dL (ref 0.61–1.24)
GFR, Estimated: >60 mL/min (ref >60)
Glucose, Bld: 165 mg/dL — ABNORMAL HIGH (ref 70–99)
Potassium: 3.5 mmol/L (ref 3.5–5.1)
Sodium: 137 mmol/L (ref 135–145)
Total Bilirubin: 2.9 mg/dL — ABNORMAL HIGH (ref 0.0–1.2)
Total Protein: 6.8 g/dL (ref 6.5–8.1)

## 2024-03-11 LAB — CANCER ANTIGEN 19-9: CA 19-9: 20185 U/mL — ABNORMAL HIGH (ref 0–35)

## 2024-03-11 LAB — CEA: CEA: 43.8 ng/mL — ABNORMAL HIGH (ref 0.0–4.7)

## 2024-03-11 MED ORDER — FENTANYL CITRATE (PF) 100 MCG/2ML IJ SOLN
INTRAMUSCULAR | Status: AC
  Filled 2024-03-11: qty 2

## 2024-03-11 MED ORDER — HYDRALAZINE HCL 20 MG/ML IJ SOLN
INTRAMUSCULAR | Status: AC
  Filled 2024-03-11: qty 1

## 2024-03-11 MED ORDER — AMLODIPINE BESYLATE 10 MG PO TABS
10.0000 mg | ORAL_TABLET | Freq: Every day | ORAL | Status: DC
  Administered 2024-03-11 – 2024-03-12 (×2): 10 mg via ORAL
  Filled 2024-03-11 (×2): qty 1

## 2024-03-11 MED ORDER — HYDRALAZINE HCL 25 MG PO TABS
25.0000 mg | ORAL_TABLET | Freq: Three times a day (TID) | ORAL | Status: DC
  Administered 2024-03-11 – 2024-03-12 (×3): 25 mg via ORAL
  Filled 2024-03-11 (×3): qty 1

## 2024-03-11 MED ORDER — FENTANYL CITRATE (PF) 100 MCG/2ML IJ SOLN
INTRAMUSCULAR | Status: AC | PRN
  Administered 2024-03-11: 50 ug via INTRAVENOUS
  Administered 2024-03-11 (×2): 25 ug via INTRAVENOUS

## 2024-03-11 MED ORDER — LIDOCAINE HCL (PF) 1 % IJ SOLN
10.0000 mL | Freq: Once | INTRAMUSCULAR | Status: AC
  Administered 2024-03-11: 10 mL
  Filled 2024-03-11: qty 10

## 2024-03-11 MED ORDER — MIDAZOLAM HCL 2 MG/2ML IJ SOLN
INTRAMUSCULAR | Status: AC | PRN
  Administered 2024-03-11: 1 mg via INTRAVENOUS
  Administered 2024-03-11 (×2): .5 mg via INTRAVENOUS

## 2024-03-11 MED ORDER — HYDRALAZINE HCL 50 MG PO TABS: 100.0000 mg | ORAL_TABLET | Freq: Three times a day (TID) | ORAL | Status: DC

## 2024-03-11 MED ORDER — LIDOCAINE HCL (PF) 1 % IJ SOLN
INTRAMUSCULAR | Status: AC
  Filled 2024-03-11: qty 30

## 2024-03-11 MED ORDER — MIDAZOLAM HCL 2 MG/2ML IJ SOLN
INTRAMUSCULAR | Status: AC
  Filled 2024-03-11: qty 2

## 2024-03-11 MED ORDER — GELATIN ABSORBABLE 12-7 MM EX MISC
CUTANEOUS | Status: AC
  Filled 2024-03-11: qty 1

## 2024-03-11 MED ORDER — HYDRALAZINE HCL 20 MG/ML IJ SOLN
10.0000 mg | Freq: Once | INTRAMUSCULAR | Status: AC
  Administered 2024-03-11: 10 mg via INTRAVENOUS

## 2024-03-11 MED ORDER — GELATIN ABSORBABLE 12-7 MM EX MISC
1.0000 | Freq: Once | CUTANEOUS | Status: AC
  Administered 2024-03-11: 1 via TOPICAL
  Filled 2024-03-11: qty 1

## 2024-03-11 NOTE — Plan of Care (Signed)

## 2024-03-11 NOTE — Plan of Care (Signed)
  Problem: Education: Goal: Knowledge of General Education information will improve Description: Including pain rating scale, medication(s)/side effects and non-pharmacologic comfort measures Outcome: Progressing   Problem: Coping: Goal: Level of anxiety will decrease Outcome: Progressing   Problem: Pain Managment: Goal: General experience of comfort will improve and/or be controlled Outcome: Progressing

## 2024-03-11 NOTE — Procedures (Signed)
 Interventional Radiology Procedure Note  Procedure: Ultrasound guided left liver mass biopsy   Findings: Please refer to procedural dictation for full description. 18 ga core biopsy x 2.  Gelfoam slurry needle track embolization.  Complications: None immediate  Estimated Blood Loss: < 5 ml  Recommendations: Strict 3 hour bedrest. Follow Pathology results.   Ester Sides, MD

## 2024-03-11 NOTE — Progress Notes (Signed)
 Progress Note   Patient: Douglas Harper FMW:[id number] DOB: 04/04/1971 DOA: 03/09/2024     2 DOS: the patient was seen and examined on 03/11/2024   Brief hospital course:  ALFIE RIDEAUX is a 53 y.o. male with medical history significant of GERD p/w abd pain and found to have pancreatic head mass with liver masses possible mets and mild pancreatitis findings. Oncology advised IR to get liver lesion biopsy. Admitted to [facility] service for further management.  Assessment and Plan: Pancreatic mass with liver lesions ?mets- Mild pancreatitis- Oncology evaluation appreciated. IR liver biopsy done today. Follow path report. Abdominal pain better. No nausea. He is started on diet. He will need to follow up with oncology regarding further management once biopsy results finalized.  Hypertension- BP better controlled. Increased Norvasc10 mg, Hydralazine  25 tid.  Hypothyroid- Continue home dose synthroid .  Hypokalemia- Replete as needed.    Out of bed to chair. Incentive spirometry. Nursing supportive care. Fall, aspiration precautions. Diet:  Diet Orders (From admission, onward)     Start     Ordered   03/11/24 1058  Diet Heart Room service appropriate? Yes; Fluid consistency: Thin  Diet effective now       Question Answer Comment  Room service appropriate? Yes   Fluid consistency: Thin      03/11/24 1057           DVT prophylaxis:   Level of care: Med-Surg   Code Status: Full Code  Subjective: Patient is seen and examined today morning after biopsy. He is in bed. No abdominal pain. Wishes to eat.  Physical Exam: Vitals:   03/11/24 1033 03/11/24 1102 03/11/24 1505 03/11/24 1629  BP: 139/87 (!) 146/91 (!) 151/91 (!) 147/89  Pulse: 75 75  80  Resp: 18 16  17   Temp:  98.2 F (36.8 C)  98.4 F (36.9 C)  TempSrc:  Oral  Oral  SpO2: 100% 100%  98%  Weight:      Height:        General - Middle aged African American male, no apparent distress HEENT - PERRLA, EOMI, atraumatic  head, non tender sinuses. Lung - Clear, no rales, rhonchi, wheezes. Heart - S1, S2 heard, no murmurs, rubs, no pedal edema. Abdomen - Soft, non tender, bowel sounds good Neuro - Alert, awake and oriented x 3, non focal exam. Skin - Warm and dry.  Data Reviewed:      Latest Ref Rng & Units 03/11/2024    2:57 AM 03/09/2024    5:15 AM 07/20/2022    7:09 AM  CBC  WBC 4.0 - 10.5 K/uL 4.9  4.8  5.3   Hemoglobin 13.0 - 17.0 g/dL 87.8  87.9  87.0   Hematocrit 39.0 - 52.0 % 37.8  38.0  40.6   Platelets 150 - 400 K/uL 160  169  152.0       Latest Ref Rng & Units 03/11/2024    2:57 AM 03/09/2024    5:15 AM 07/20/2022    7:09 AM  BMP  Glucose 70 - 99 mg/dL 834  889  87   BUN 6 - 20 mg/dL 12  10  12    Creatinine 0.61 - 1.24 mg/dL 8.83  8.66  8.67   Sodium 135 - 145 mmol/L 137  138  141   Potassium 3.5 - 5.1 mmol/L 3.5  3.4  4.1   Chloride 98 - 111 mmol/L 100  101  104   CO2 22 - 32 mmol/L 25  26  30   Calcium 8.9 - 10.3 mg/dL 8.9  9.4  9.3    US  BIOPSY (LIVER) Result Date: 03/11/2024 INDICATION: 53 year old male with history of multifocal liver masses suspicious for metastasis. EXAM: ULTRASOUND GUIDED LIVER LESION BIOPSY COMPARISON:  None Available. MEDICATIONS: None ANESTHESIA/SEDATION: Fentanyl  100 mcg IV; Versed  2 mg IV Total Moderate Sedation time:  17 minutes. The patient's level of consciousness and vital signs were monitored continuously by radiology nursing throughout the procedure under my direct supervision. COMPLICATIONS: None immediate. PROCEDURE: Informed written consent was obtained from the patient after a discussion of the risks, benefits and alternatives to treatment. The patient understands and consents the procedure. A timeout was performed prior to the initiation of the procedure. Ultrasound scanning was performed of the right upper abdominal quadrant demonstrates ill-defined hypoechoic mass in in the left lobe of the liver. The left lobe liver mass was selected for biopsy and the  procedure was planned. The right upper abdominal quadrant was prepped and draped in the usual sterile fashion. The overlying soft tissues were anesthetized with 1% lidocaine  with epinephrine . A 17 gauge, 6.8 cm co-axial needle was advanced into a peripheral aspect of the lesion. This was followed by 3 core biopsies with an 18 gauge core device under direct ultrasound guidance. The coaxial needle tract was embolized with a small amount of Gel-Foam slurry and superficial hemostasis was obtained with manual compression. Post procedural scanning was negative for definitive area of hemorrhage or additional complication. A dressing was placed. The patient tolerated the procedure well without immediate post procedural complication. IMPRESSION: Technically successful ultrasound guided core needle biopsy of left lobe liver mass. Ester Sides, MD [facility] Electronically Signed   By: Ester Sides M.D.   On: 03/11/2024 16:20    Family Communication: Discussed with patient, understand and agree. All questions answered.  Disposition: Status is: Inpatient Remains inpatient appropriate because: follow path report, advance diet.  Planned Discharge Destination: Home     Time spent: 42 minutes  Author: Concepcion Riser, MD 03/11/2024 7:10 PM Secure chat 7am to 7pm For on call review [email or web address].

## 2024-03-12 ENCOUNTER — Other Ambulatory Visit (HOSPITAL_COMMUNITY): Payer: Self-pay

## 2024-03-12 ENCOUNTER — Encounter: Payer: Self-pay | Admitting: *Deleted

## 2024-03-12 ENCOUNTER — Telehealth: Payer: Self-pay | Admitting: Oncology

## 2024-03-12 DIAGNOSIS — R7401 Elevation of levels of liver transaminase levels: Secondary | ICD-10-CM

## 2024-03-12 DIAGNOSIS — C259 Malignant neoplasm of pancreas, unspecified: Principal | ICD-10-CM

## 2024-03-12 DIAGNOSIS — E039 Hypothyroidism, unspecified: Secondary | ICD-10-CM

## 2024-03-12 LAB — COMPREHENSIVE METABOLIC PANEL WITH GFR
ALT: 339 U/L — ABNORMAL HIGH (ref 0–44)
AST: 298 U/L — ABNORMAL HIGH (ref 15–41)
Albumin: 3.3 g/dL — ABNORMAL LOW (ref 3.5–5.0)
Alkaline Phosphatase: 196 U/L — ABNORMAL HIGH (ref 38–126)
Anion gap: 9 (ref 5–15)
BUN: 9 mg/dL (ref 6–20)
CO2: 25 mmol/L (ref 22–32)
Calcium: 9 mg/dL (ref 8.9–10.3)
Chloride: 102 mmol/L (ref 98–111)
Creatinine, Ser: 1.05 mg/dL (ref 0.61–1.24)
GFR, Estimated: >60 mL/min (ref >60)
Glucose, Bld: 174 mg/dL — ABNORMAL HIGH (ref 70–99)
Potassium: 3.4 mmol/L — ABNORMAL LOW (ref 3.5–5.1)
Sodium: 136 mmol/L (ref 135–145)
Total Bilirubin: 4.2 mg/dL — ABNORMAL HIGH (ref 0.0–1.2)
Total Protein: 7 g/dL (ref 6.5–8.1)

## 2024-03-12 LAB — CBC
HCT: 37.3 % — ABNORMAL LOW (ref 39.0–52.0)
Hemoglobin: 12 g/dL — ABNORMAL LOW (ref 13.0–17.0)
MCH: 19.8 pg — ABNORMAL LOW (ref 26.0–34.0)
MCHC: 32.2 g/dL (ref 30.0–36.0)
MCV: 61.6 fL — ABNORMAL LOW (ref 80.0–100.0)
Platelets: 169 K/uL (ref 150–400)
RBC: 6.06 MIL/uL — ABNORMAL HIGH (ref 4.22–5.81)
RDW: 17.5 % — ABNORMAL HIGH (ref 11.5–15.5)
WBC: 5.9 K/uL (ref 4.0–10.5)
nRBC: 0 % (ref 0.0–0.2)

## 2024-03-12 LAB — SURGICAL PATHOLOGY

## 2024-03-12 MED ORDER — HYDROMORPHONE HCL 2 MG PO TABS
1.0000 mg | ORAL_TABLET | Freq: Four times a day (QID) | ORAL | 0 refills | Status: DC | PRN
  Filled 2024-03-12: qty 14, 7d supply, fill #0

## 2024-03-12 MED ORDER — AMLODIPINE BESYLATE 10 MG PO TABS
10.0000 mg | ORAL_TABLET | Freq: Every day | ORAL | 1 refills | Status: DC
  Filled 2024-03-12: qty 30, 30d supply, fill #0
  Filled 2024-04-17: qty 30, 30d supply, fill #1

## 2024-03-12 NOTE — Progress Notes (Signed)
 Explained discharge instructions to patient. Reviewed follow up appointment and next medication administration times. Also reviewed education. Patient verbalized having an understanding for instructions given. All belongings are in the patient's possession to include TOC meds. IV removed. No other needs verbalized. Will transport downstairs to  the discharge lounge to await his ride home.

## 2024-03-12 NOTE — Progress Notes (Signed)
 PATIENT NAVIGATOR PROGRESS NOTE  Name: Douglas Harper Date: 03/12/2024 MRN: [id number]  DOB: [birth year]   Reason for visit:  New pt appt and introductory phone call  Comments:  Called and spoke with pt and have scheduled him with Dr Cloretta on Friday, 9/26 at 1:45pm Directions to building and parking sent via mychart message per pt request  Provided phone number to call with any questions    Time spent counseling/coordinating care: 30-45 minutes

## 2024-03-12 NOTE — Plan of Care (Signed)
  Problem: Education: Goal: Knowledge of General Education information will improve Description: Including pain rating scale, medication(s)/side effects and non-pharmacologic comfort measures Outcome: Adequate for Discharge   

## 2024-03-12 NOTE — Plan of Care (Signed)

## 2024-03-12 NOTE — Discharge Summary (Signed)
 Physician Discharge Summary   Patient: Douglas Harper MRN: [id number] DOB: 02-21-1971  Admit date:     03/09/2024  Discharge date: {dischdate:26783}  Discharge Physician: Concepcion Riser   PCP: [facility], [facility]   Recommendations at discharge:  {Tip this will not be part of the note when signed- Example include specific recommendations for outpatient follow-up, pending tests to follow-up on. (Optional):26781}  ***  Discharge Diagnoses: Principal Problem:   Mass of head of pancreas  Resolved Problems:   * No resolved hospital problems. [facility] Course: No notes on file  Assessment and Plan: No notes have been filed under this hospital service. Service: Hospitalist     {Tip this will not be part of the note when signed Body mass index is 25.2 kg/m. , ,  (Optional):26781}  {(NOTE) Pain control PDMP Statment (Optional):26782} Consultants: *** Procedures performed: ***  Disposition: {Plan; Disposition:26390} Diet recommendation:  Discharge Diet Orders (From admission, onward)     Start     Ordered   03/12/24 0000  Diet - low sodium heart healthy        03/12/24 0944           {Diet_Plan:26776} DISCHARGE MEDICATION: Allergies as of 03/12/2024   No Known Allergies      Medication List     TAKE these medications    amLODipine  10 MG tablet Commonly known as: NORVASC  Take 1 tablet (10 mg total) by mouth daily. Start taking on: March 13, 2024   HYDROmorphone  2 MG tablet Commonly known as: Dilaudid  Take 0.5 tablets (1 mg total) by mouth every 6 (six) hours as needed for severe pain (pain score 7-10).   levothyroxine  125 MCG tablet Commonly known as: SYNTHROID  TAKE 1 TABLET BY MOUTH 30 MINUTES BEFORE BREAKFAST (STOP DOSE)   omeprazole 40 MG capsule Commonly known as: PRILOSEC Take 40 mg by mouth daily.        Follow-up Information     [facility] Follow up in 1 week(s).   Specialty: Family  Medicine Contact information: [address] [place] [address] [phone]         Cloretta Arley NOVAK, MD. Schedule an appointment as soon as possible for a visit in 1 week(s).   Specialty: Oncology Contact information: [address] [place] [address] [phone]                Discharge Exam: [name]   03/09/24 0349  Weight: 93.9 kg   ***  Condition at discharge: {DC Condition:26389}  The results of significant diagnostics from this hospitalization (including imaging, microbiology, ancillary and laboratory) are listed below for reference.   Imaging Studies: US  BIOPSY (LIVER) Result Date: 03/11/2024 INDICATION: 53 year old male with history of multifocal liver masses suspicious for metastasis. EXAM: ULTRASOUND GUIDED LIVER LESION BIOPSY COMPARISON:  None Available. MEDICATIONS: None ANESTHESIA/SEDATION: Fentanyl  100 mcg IV; Versed  2 mg IV Total Moderate Sedation time:  17 minutes. The patient's level of consciousness and vital signs were monitored continuously by radiology nursing throughout the procedure under my direct supervision. COMPLICATIONS: None immediate. PROCEDURE: Informed written consent was obtained from the patient after a discussion of the risks, benefits and alternatives to treatment. The patient understands and consents the procedure. A timeout was performed prior to the initiation of the procedure. Ultrasound scanning was performed of the right upper abdominal quadrant demonstrates ill-defined hypoechoic mass in in the left lobe of the liver. The left lobe liver mass was selected  for biopsy and the procedure was planned. The right upper abdominal quadrant was prepped and draped in the usual sterile fashion. The overlying soft tissues were anesthetized with 1% lidocaine  with epinephrine . A 17 gauge, 6.8 cm co-axial needle was advanced into a peripheral aspect of the lesion. This was followed by 3 core biopsies with an 18 gauge core  device under direct ultrasound guidance. The coaxial needle tract was embolized with a small amount of Gel-Foam slurry and superficial hemostasis was obtained with manual compression. Post procedural scanning was negative for definitive area of hemorrhage or additional complication. A dressing was placed. The patient tolerated the procedure well without immediate post procedural complication. IMPRESSION: Technically successful ultrasound guided core needle biopsy of left lobe liver mass. Ester Sides, MD [facility] Radiology Electronically Signed   By: Ester Sides M.D.   On: 03/11/2024 16:20   CT ABDOMEN PELVIS W WO CONTRAST Result Date: 03/09/2024 CLINICAL DATA:  Liver mass on right upper quadrant ultrasound, chest pain. * Tracking Code: BO * EXAM: CT CHEST WITH CONTRAST CT ABDOMEN WITHOUT AND WITH CONTRAST TECHNIQUE: Multidetector CT imaging of the abdomen was performed without intravenous contrast. Multidetector CT imaging of the chest and abdomen was then performed during bolus administration of intravenous contrast. RADIATION DOSE REDUCTION: This exam was performed according to the departmental dose-optimization program which includes automated exposure control, adjustment of the mA and/or kV according to patient size and/or use of iterative reconstruction technique. CONTRAST:  75mL OMNIPAQUE  IOHEXOL  350 MG/ML SOLN COMPARISON:  Ultrasound abdomen from earlier the same day. FINDINGS: CT CHEST FINDINGS Cardiovascular: Normal cardiac size. No pericardial effusion. No aortic aneurysm. There are coronary artery calcifications, in keeping with coronary artery disease. Mediastinum/Nodes: Visualized thyroid  gland appears grossly unremarkable. No solid / cystic mediastinal masses. The esophagus is nondistended precluding optimal assessment. No axillary, mediastinal or hilar lymphadenopathy by size criteria. Lungs/Pleura: The central tracheo-bronchial tree is patent.  There are dependent changes in bilateral lungs. No mass or consolidation. No pleural effusion or pneumothorax. No suspicious lung nodules. Musculoskeletal: The visualized soft tissues of the chest wall are grossly unremarkable. No suspicious osseous lesions. CT ABDOMEN PELVIS FINDINGS Hepatobiliary: The liver is normal in size. There is liver surface irregularity/nodularity, suggesting cirrhosis cirrhotic appearance due to underlying liver lesions described as follows. There are multiple (more than 15), ill-defined hypoattenuating masses throughout the liver with largest in the left hepatic lobe, segment 4 B/3 junction region measuring up to 3.0 x 3.2 cm. No intrahepatic or extrahepatic bile duct dilation. No calcified gallstones. Normal gallbladder wall thickness. No pericholecystic inflammatory changes. Pancreas: There is an ill-defined, hypoenhancing, approximately 1.6 x 1.9 cm lesion in the pancreatic head, which is highly concerning for pancreatic malignancy. There is resultant moderate upstream main pancreatic duct dilation, measuring up to 8 mm in the neck region. No peripancreatic fat stranding. There is resultant up to moderate narrowing of the portal vein at the level of confluence of SMV/splenic veins (series 7, image 66). Rest of the main portal vein and its tributaries are otherwise normal in caliber and patent. Unremarkable hepatic veins. There is subtle fat stranding surrounding the pancreatic head/uncinate process and neck region, which is nonspecific but can be seen with acute interstitial pancreatitis. Correlate clinically and with serum lipase levels. Spleen: Within normal limits. No focal lesion. Adrenals/Urinary Tract: There are bilateral adrenal adenomas. On the left it measures up to 1.0 x 1.6 cm and on the right it measures up to 0.9 x 1.2  cm. No suspicious renal mass. There is a 1.3 x 1.6 cm simple cyst arising from the left kidney upper pole. There is a 6 x 10 mm nonobstructing calculus in  the left kidney upper pole and an additional 2 mm nonobstructing calculus in the left kidney lower pole. No other nephroureterolithiasis on either side. No obstructive uropathy on either side. Urinary bladder is under distended, precluding optimal assessment. However, no large mass or stones identified. No perivesical fat stranding. Stomach/Bowel: No disproportionate dilation of the small or large bowel loops. No evidence of abnormal bowel wall thickening. There is subtle fat stranding surrounding the proximal duodenum, which is nonspecific but typically seen as sequela of pancreatitis or duodenitis. Correlate clinically. The appendix is unremarkable. Vascular/Lymphatic: No ascites or pneumoperitoneum. No abdominal or pelvic lymphadenopathy, by size criteria. No aneurysmal dilation of the major abdominal arteries. Reproductive: Normal size prostate. Symmetric seminal vesicles. Other: There is a tiny fat containing umbilical hernia. There are bilateral small fat containing inguinal hernias. Musculoskeletal: No suspicious osseous lesions. There are mild multilevel degenerative changes in the visualized spine. IMPRESSION: 1. There is an ill-defined, hypoenhancing, approximately 1.6 x 1.9 cm lesion in the pancreatic head, which is highly concerning for pancreatic malignancy. There is resultant moderate upstream main pancreatic duct dilation, measuring up to 8 mm in the neck region. There is also resultant up to moderate narrowing of the portal vein at the level of confluence of SMV/splenic veins. 2. There are multiple (more than 15), ill-defined hypoattenuating masses throughout the liver with largest in the left hepatic lobe, segment 4B/3 junction region measuring up to 3.0 x 3.2 cm. These are compatible with metastases. 3. There is subtle fat stranding surrounding the pancreatic head/uncinate process and neck region, which is nonspecific but can be seen with acute interstitial pancreatitis. There is subtle fat  stranding surrounding the proximal duodenum, which is typically seen as sequela of pancreatitis or duodenitis. 4. No metastatic disease identified within the chest. 5. Multiple other nonacute observations, as described above. Aortic Atherosclerosis (ICD10-I70.0). Electronically Signed   By: Ree Molt M.D.   On: 03/09/2024 10:17   CT CHEST W CONTRAST Result Date: 03/09/2024 CLINICAL DATA:  Liver mass on right upper quadrant ultrasound, chest pain. * Tracking Code: BO * EXAM: CT CHEST WITH CONTRAST CT ABDOMEN WITHOUT AND WITH CONTRAST TECHNIQUE: Multidetector CT imaging of the abdomen was performed without intravenous contrast. Multidetector CT imaging of the chest and abdomen was then performed during bolus administration of intravenous contrast. RADIATION DOSE REDUCTION: This exam was performed according to the departmental dose-optimization program which includes automated exposure control, adjustment of the mA and/or kV according to patient size and/or use of iterative reconstruction technique. CONTRAST:  75mL OMNIPAQUE  IOHEXOL  350 MG/ML SOLN COMPARISON:  Ultrasound abdomen from earlier the same day. FINDINGS: CT CHEST FINDINGS Cardiovascular: Normal cardiac size. No pericardial effusion. No aortic aneurysm. There are coronary artery calcifications, in keeping with coronary artery disease. Mediastinum/Nodes: Visualized thyroid  gland appears grossly unremarkable. No solid / cystic mediastinal masses. The esophagus is nondistended precluding optimal assessment. No axillary, mediastinal or hilar lymphadenopathy by size criteria. Lungs/Pleura: The central tracheo-bronchial tree is patent. There are dependent changes in bilateral lungs. No mass or consolidation. No pleural effusion or pneumothorax. No suspicious lung nodules. Musculoskeletal: The visualized soft tissues of the chest wall are grossly unremarkable. No suspicious osseous lesions. CT ABDOMEN PELVIS FINDINGS Hepatobiliary: The liver is normal in  size. There is liver surface irregularity/nodularity, suggesting cirrhosis cirrhotic appearance due to underlying  liver lesions described as follows. There are multiple (more than 15), ill-defined hypoattenuating masses throughout the liver with largest in the left hepatic lobe, segment 4 B/3 junction region measuring up to 3.0 x 3.2 cm. No intrahepatic or extrahepatic bile duct dilation. No calcified gallstones. Normal gallbladder wall thickness. No pericholecystic inflammatory changes. Pancreas: There is an ill-defined, hypoenhancing, approximately 1.6 x 1.9 cm lesion in the pancreatic head, which is highly concerning for pancreatic malignancy. There is resultant moderate upstream main pancreatic duct dilation, measuring up to 8 mm in the neck region. No peripancreatic fat stranding. There is resultant up to moderate narrowing of the portal vein at the level of confluence of SMV/splenic veins (series 7, image 66). Rest of the main portal vein and its tributaries are otherwise normal in caliber and patent. Unremarkable hepatic veins. There is subtle fat stranding surrounding the pancreatic head/uncinate process and neck region, which is nonspecific but can be seen with acute interstitial pancreatitis. Correlate clinically and with serum lipase levels. Spleen: Within normal limits. No focal lesion. Adrenals/Urinary Tract: There are bilateral adrenal adenomas. On the left it measures up to 1.0 x 1.6 cm and on the right it measures up to 0.9 x 1.2 cm. No suspicious renal mass. There is a 1.3 x 1.6 cm simple cyst arising from the left kidney upper pole. There is a 6 x 10 mm nonobstructing calculus in the left kidney upper pole and an additional 2 mm nonobstructing calculus in the left kidney lower pole. No other nephroureterolithiasis on either side. No obstructive uropathy on either side. Urinary bladder is under distended, precluding optimal assessment. However, no large mass or stones identified. No perivesical fat  stranding. Stomach/Bowel: No disproportionate dilation of the small or large bowel loops. No evidence of abnormal bowel wall thickening. There is subtle fat stranding surrounding the proximal duodenum, which is nonspecific but typically seen as sequela of pancreatitis or duodenitis. Correlate clinically. The appendix is unremarkable. Vascular/Lymphatic: No ascites or pneumoperitoneum. No abdominal or pelvic lymphadenopathy, by size criteria. No aneurysmal dilation of the major abdominal arteries. Reproductive: Normal size prostate. Symmetric seminal vesicles. Other: There is a tiny fat containing umbilical hernia. There are bilateral small fat containing inguinal hernias. Musculoskeletal: No suspicious osseous lesions. There are mild multilevel degenerative changes in the visualized spine. IMPRESSION: 1. There is an ill-defined, hypoenhancing, approximately 1.6 x 1.9 cm lesion in the pancreatic head, which is highly concerning for pancreatic malignancy. There is resultant moderate upstream main pancreatic duct dilation, measuring up to 8 mm in the neck region. There is also resultant up to moderate narrowing of the portal vein at the level of confluence of SMV/splenic veins. 2. There are multiple (more than 15), ill-defined hypoattenuating masses throughout the liver with largest in the left hepatic lobe, segment 4B/3 junction region measuring up to 3.0 x 3.2 cm. These are compatible with metastases. 3. There is subtle fat stranding surrounding the pancreatic head/uncinate process and neck region, which is nonspecific but can be seen with acute interstitial pancreatitis. There is subtle fat stranding surrounding the proximal duodenum, which is typically seen as sequela of pancreatitis or duodenitis. 4. No metastatic disease identified within the chest. 5. Multiple other nonacute observations, as described above. Aortic Atherosclerosis (ICD10-I70.0). Electronically Signed   By: Ree Molt M.D.   On: 03/09/2024  10:17   US  Abdomen Limited RUQ (LIVER/GB) Result Date: 03/09/2024 CLINICAL DATA:  [id number].  Elevated liver enzymes. EXAM: ULTRASOUND ABDOMEN LIMITED RIGHT UPPER QUADRANT COMPARISON:  Ultrasound complete abdomen  from 12/23/2018. FINDINGS: Gallbladder: No gallstones or wall thickening visualized. The gallbladder is mildly dilated measuring 11 cm in length. There is a small amount of hypoechoic layering sludge. No sonographic Murphy sign noted by sonographer. Common bile duct: Diameter: Measures prominent at 7.4 mm mild intrahepatic biliary prominence. Liver: Not seen in 2020, there is a heterogeneously hypoechoic solid mass with scattered color flow in the left lobe measuring 3.5 x 2.9 x 3.3 cm, worrisome for primary or metastatic neoplasm. CT or MRI recommended preferably without and with contrast. Otherwise, the liver is within normal limits in parenchymal echogenicity. Portal vein is patent on color Doppler imaging with normal direction of blood flow towards the liver. Other: No right upper quadrant ascites. IMPRESSION: 1. 3.5 x 2.9 x 3.3 cm heterogeneously hypoechoic solid mass in the left lobe of the liver, worrisome for primary or metastatic neoplasm. CT or MRI recommended preferably without and with contrast. 2. Mildly dilated gallbladder with a small amount of sludge. No gallstones or wall thickening. 3. Prominent common bile duct at 7.4 mm with mild intrahepatic biliary prominence. Electronically Signed   By: Francis Quam M.D.   On: 03/09/2024 07:46   DG Ribs Unilateral W/Chest Left Result Date: 03/09/2024 CLINICAL DATA:  53 year old male with back and rib pain for 1 month. EXAM: LEFT RIBS AND CHEST - 3+ VIEW COMPARISON:  None Available. FINDINGS: PA view of the chest 0457 hours. Low normal lung volumes. Normal cardiac size and mediastinal contours. Visualized tracheal air column is within normal limits. Both lungs appear clear. No pneumothorax or pleural effusion. Four oblique views of the left ribs.  Bone mineralization is within normal limits. 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 EKG leads project over the left chest. No left rib fracture or rib lesion identified. Other visible osseous structures appear intact. Negative visible bowel gas pattern. However, left abdominal oval, crescent-shaped 13 mm calculus is visible on multiple views, suspicious for left nephrolithiasis. IMPRESSION: 1. No acute cardiopulmonary abnormality.  Negative left radiographs. 2. Evidence of bulky 13 mm left renal calculus.  Query renal colic. Electronically Signed   By: VEAR Hurst M.D.   On: 03/09/2024 05:32    Microbiology: Results for orders placed or performed during the hospital encounter of 12/20/22  Culture, group A strep     Status: None   Collection Time: 12/20/22  8:58 AM   Specimen: Throat  Result Value Ref Range Status   Specimen Description THROAT  Final   Special Requests NONE  Final   Culture   Final    NO GROUP A STREP (S.PYOGENES) ISOLATED Performed at [facility], [address], [place], [place] [address]    Report Status 12/22/2022 FINAL  Final    Labs: CBC: Recent Labs  Lab 03/09/24 0515 03/11/24 0257 03/12/24 0325  WBC 4.8 4.9 5.9  NEUTROABS 3.1  --   --   HGB 12.0* 12.1* 12.0*  HCT 38.0* 37.8* 37.3*  MCV 63.1* 63.1* 61.6*  PLT 169 160 169   Basic Metabolic Panel: Recent Labs  Lab 03/09/24 0515 03/11/24 0257 03/12/24 0325  NA 138 137 136  K 3.4* 3.5 3.4*  CL 101 100 102  CO2 26 25 25   GLUCOSE 110* 165* 174*  BUN 10 12 9   CREATININE 1.33* 1.16 1.05  CALCIUM 9.4 8.9 9.0   Liver Function Tests: Recent Labs  Lab 03/09/24 0515 03/11/24 0257 03/12/24 0325  AST 108* 339* 298*  ALT 135* 290* 339*  ALKPHOS 130* 164* 196*  BILITOT  1.5* 2.9* 4.2*  PROT 7.5 6.8 7.0  ALBUMIN 3.9 3.3* 3.3*   CBG: No results for input(s): GLUCAP in the last 168 hours.  Discharge time spent: {LESS THAN/GREATER UYJW:73611} 30 minutes.  Signed: Concepcion Riser, MD [facility] 03/12/2024

## 2024-03-13 DIAGNOSIS — C259 Malignant neoplasm of pancreas, unspecified: Secondary | ICD-10-CM | POA: Insufficient documentation

## 2024-03-13 DIAGNOSIS — R7401 Elevation of levels of liver transaminase levels: Secondary | ICD-10-CM | POA: Insufficient documentation

## 2024-03-13 DIAGNOSIS — E876 Hypokalemia: Secondary | ICD-10-CM | POA: Insufficient documentation

## 2024-03-13 DIAGNOSIS — I10 Essential (primary) hypertension: Secondary | ICD-10-CM | POA: Insufficient documentation

## 2024-03-13 NOTE — Progress Notes (Signed)
 Subjective   Douglas Harper is a 53 year old male here for Hospital Visit (Patient in today for hospital follow-up after 5 day hospitalization. He states that he was diagnosed with pancreatic cancer. He has an oncology appointment tomorrow. He was having gastric pain and that is the reason he initially went to hospital. 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 Recent abnl LFTs. Seen on 9/15. Bili 1.8, alk phos 155, ALT 50, AST 32  Liver biopsy revealed Invasive moderately differentiated pancreatic adenocarcinoma.  He is scheduled for follow-up with Dr. Arvella Hof in AM for evaluation. He feels relatively well. He had needle bx via epigastric region and is tender in that area. Takes PO Dilaudid  HS, give at hospital.  He was started on PO amlodipine  for HBP in the hospital. On 9/24 bilirubin was 4.2, ALT 339, AST 298. Today he feels relatively well.   Documentation Reviewed by Any User: Tobacco  Allergies  Meds  Problems   Med Hx  Surg Hx  Fam Hx     Current Outpatient Medications  Medication Sig Dispense Refill  . amLODIPine  (NORVASC ) 10 mg tablet Take 10 mg by mouth once daily.    . HYDROmorphone  (DILAUDID ) 2 mg tablet Take 1 mg by mouth every 6 (six) hours as needed for pain.    SABRA omeprazole (PRILOSEC) 40 mg DR capsule Take 1 Capsule by mouth every morning before breakfast. 60 Capsule 1  . levothyroxine  125 mcg tablet Take 125 mcg by mouth once daily     Objective   BP 135/83 (Left Arm, Sitting, Large Adult)  Pulse 80  Temp 98.2 F (36.8 C)  Ht 6' 4 (1.93 m)  Wt 207 lb (93.9 kg)  SpO2 95%  BMI 25.20 kg/m  Smoking Status Never  BSA 2.24 m  Last 3 Vitals    Flowsheet Row Office Visit from 03/13/2024 in [place] at [facility] Medicine Office Visit from 02/29/2024 in [facility] at [facility] Medicine Office Visit from 08/02/2023 in [facility] at [facility]  Temp 98.2 F (36.8 C) 97.1 F (36.2 C) 98.3 F (36.8 C)  Pulse 80 68 64  BP 135/83 132/84 * [Manual]  120/74  Resp -- -- --  Weight 207 lb (93.9 kg) 213 lb 6.4 oz (96.8 kg) 211 lb 9.6 oz (96 kg)     Physical Exam HENT ATNC, EOMI Lung clear Cor RRR Abd mild epigastric tenderness Ext. No CCE  Assessment and Plan   1. Pancreatic cancer metastasized to liver (CMS & HHS-HCC) Comments: For Oncology Appointment in AM with Dr. Arvella Audery [place] chart reviewed  2. Transition of patient between care settings Comments: Medication reconciled  3. Hypertension, unspecified type Comments: Improved on medication       *Some images could not be shown.

## 2024-03-14 ENCOUNTER — Encounter: Payer: Self-pay | Admitting: *Deleted

## 2024-03-14 ENCOUNTER — Inpatient Hospital Stay

## 2024-03-14 ENCOUNTER — Other Ambulatory Visit: Payer: Self-pay | Admitting: *Deleted

## 2024-03-14 ENCOUNTER — Other Ambulatory Visit: Payer: Self-pay | Admitting: Radiology

## 2024-03-14 ENCOUNTER — Telehealth: Payer: Self-pay | Admitting: Genetic Counselor

## 2024-03-14 ENCOUNTER — Other Ambulatory Visit: Payer: Self-pay | Admitting: Oncology

## 2024-03-14 ENCOUNTER — Inpatient Hospital Stay: Attending: Oncology | Admitting: Oncology

## 2024-03-14 VITALS — BP 144/99 | HR 83 | Temp 97.8°F | Resp 18 | Ht 76.0 in | Wt 207.0 lb

## 2024-03-14 DIAGNOSIS — C259 Malignant neoplasm of pancreas, unspecified: Secondary | ICD-10-CM

## 2024-03-14 DIAGNOSIS — Z800 Family history of malignant neoplasm of digestive organs: Secondary | ICD-10-CM | POA: Diagnosis not present

## 2024-03-14 DIAGNOSIS — C250 Malignant neoplasm of head of pancreas: Secondary | ICD-10-CM | POA: Diagnosis present

## 2024-03-14 DIAGNOSIS — G893 Neoplasm related pain (acute) (chronic): Secondary | ICD-10-CM | POA: Insufficient documentation

## 2024-03-14 DIAGNOSIS — R748 Abnormal levels of other serum enzymes: Secondary | ICD-10-CM | POA: Diagnosis not present

## 2024-03-14 LAB — CMP (CANCER CENTER ONLY)
ALT: 417 U/L (ref 0–44)
AST: 208 U/L (ref 15–41)
Albumin: 4.3 g/dL (ref 3.5–5.0)
Alkaline Phosphatase: 305 U/L — ABNORMAL HIGH (ref 38–126)
Anion gap: 12 (ref 5–15)
BUN: 17 mg/dL (ref 6–20)
CO2: 26 mmol/L (ref 22–32)
Calcium: 10.1 mg/dL (ref 8.9–10.3)
Chloride: 99 mmol/L (ref 98–111)
Creatinine: 1.15 mg/dL (ref 0.61–1.24)
GFR, Estimated: >60 mL/min (ref >60)
Glucose, Bld: 116 mg/dL — ABNORMAL HIGH (ref 70–99)
Potassium: 3.7 mmol/L (ref 3.5–5.1)
Sodium: 136 mmol/L (ref 135–145)
Total Bilirubin: 3.4 mg/dL — ABNORMAL HIGH (ref 0.0–1.2)
Total Protein: 8.4 g/dL — ABNORMAL HIGH (ref 6.5–8.1)

## 2024-03-14 LAB — CBC WITH DIFFERENTIAL (CANCER CENTER ONLY)
Abs Immature Granulocytes: 0.02 K/uL (ref 0.00–0.07)
Basophils Absolute: 0.1 K/uL (ref 0.0–0.1)
Basophils Relative: 1 %
Eosinophils Absolute: 0.1 K/uL (ref 0.0–0.5)
Eosinophils Relative: 2 %
HCT: 39.1 % (ref 39.0–52.0)
Hemoglobin: 12.4 g/dL — ABNORMAL LOW (ref 13.0–17.0)
Immature Granulocytes: 0 %
Lymphocytes Relative: 13 %
Lymphs Abs: 0.7 K/uL (ref 0.7–4.0)
MCH: 20 pg — ABNORMAL LOW (ref 26.0–34.0)
MCHC: 31.7 g/dL (ref 30.0–36.0)
MCV: 63 fL — ABNORMAL LOW (ref 80.0–100.0)
Monocytes Absolute: 0.5 K/uL (ref 0.1–1.0)
Monocytes Relative: 9 %
Neutro Abs: 4 K/uL (ref 1.7–7.7)
Neutrophils Relative %: 75 %
Platelet Count: 195 K/uL (ref 150–400)
RBC: 6.21 MIL/uL — ABNORMAL HIGH (ref 4.22–5.81)
RDW: 18.8 % — ABNORMAL HIGH (ref 11.5–15.5)
WBC Count: 5.4 K/uL (ref 4.0–10.5)
nRBC: 0 % (ref 0.0–0.2)

## 2024-03-14 NOTE — Progress Notes (Deleted)
  [facility] New Patient Consult   Requesting MD: [facility], [facility] [address] [place],  [place] [address]   Douglas Harper 53 y.o.  08/23/1970    Reason for Consult: ***   HPI: ***  Past Medical History:  Diagnosis Date   COVID-19    03/17/20   FH: heart disease    GERD (gastroesophageal reflux disease)    Glaucoma    Hyperthyroidism    s/p RAI 2009    Hypothyroidism    Verruca vulgaris     Past Surgical History:  Procedure Laterality Date   ANTERIOR CRUCIATE LIGAMENT REPAIR Right 2000   HAND SURGERY Right 2001   with hardware implanted   NO PAST SURGERIES      Medications: Reviewed  Allergies: No Known Allergies  Family history: ***  Social History:   ***  ROS:   Positives include:  A complete ROS was otherwise negative.  Physical Exam:  Blood pressure (!) 144/99, pulse 83, temperature 97.8 F (36.6 C), resp. rate 18, height 6' 4 (1.93 m), weight 207 lb (93.9 kg), SpO2 100%.  HEENT: *** Lungs: *** Cardiac: *** Abdomen: *** GU: ***  Vascular: *** Lymph nodes: *** Neurologic: *** Skin: *** Musculoskeletal: ***   LAB:  CBC  Lab Results  Component Value Date   WBC 5.4 03/14/2024   HGB 12.4 (L) 03/14/2024   HCT 39.1 03/14/2024   MCV 63.0 (L) 03/14/2024   PLT 195 03/14/2024   NEUTROABS PENDING 03/14/2024        CMP  Lab Results  Component Value Date   NA 136 03/12/2024   K 3.4 (L) 03/12/2024   CL 102 03/12/2024   CO2 25 03/12/2024   GLUCOSE 174 (H) 03/12/2024   BUN 9 03/12/2024   CREATININE 1.05 03/12/2024   CALCIUM 9.0 03/12/2024   PROT 7.0 03/12/2024   ALBUMIN 3.3 (L) 03/12/2024   AST 298 (H) 03/12/2024   ALT 339 (H) 03/12/2024   ALKPHOS 196 (H) 03/12/2024   BILITOT 4.2 (H) 03/12/2024   GFRNONAA >60 03/12/2024   GFRAA 83 04/11/2019     Lab Results  Component Value Date   CEA1 43.8 (H) 03/10/2024   CAN199 20,185 (H) 03/10/2024    Imaging:  No results  found.    Assessment/Plan:   *** ***    Disposition:   ***  Arley Hof, MD  03/14/2024, 2:10 PM

## 2024-03-14 NOTE — Progress Notes (Signed)
 CRITICAL VALUE STICKER  CRITICAL VALUE:AST 208 ALT 417  RECEIVER (on-site recipient of call):Claribel Sachs  DATE & TIME NOTIFIED: 03/14/24 1438  MESSENGER (representative from lab):Jasmine L.  MD NOTIFIED: GBS  TIME OF NOTIFICATION:03/14/24 1438  RESPONSE:  Patient is in with the provider

## 2024-03-14 NOTE — H&P (Signed)
 Chief Complaint: Pancreatic cancer; referred for Port-A-Cath placement to assist with treatment  Referring Provider(s): Sherrill,B  Supervising Physician: Jenna Hacker  Patient Status: [facility] - Out-pt  History of Present Illness: Douglas Harper is a 53 y.o. male with past medical history significant for GERD, glaucoma, hypothyroidism, hypertension, red cell microcytosis, hyperthyroidism status post RAI in 2009 who presents now with newly diagnosed stage IV pancreatic cancer.  He is scheduled today for Port-A-Cath placement to assist with palliative treatment.  He is familiar to IR team from left lobe liver mass biopsy on 03/11/2024.   Patient is Full Code  Past Medical History:  Diagnosis Date   COVID-19    03/17/20   FH: heart disease    GERD (gastroesophageal reflux disease)    Glaucoma    Hyperthyroidism    s/p RAI 2009    Hypothyroidism    Verruca vulgaris     Past Surgical History:  Procedure Laterality Date   ANTERIOR CRUCIATE LIGAMENT REPAIR Right 2000   HAND SURGERY Right 2001   with hardware implanted   NO PAST SURGERIES      Allergies: Patient has no known allergies.  Medications: Prior to Admission medications   Medication Sig Start Date End Date Taking? Authorizing Provider  amLODipine  (NORVASC ) 10 MG tablet Take 1 tablet (10 mg total) by mouth daily. 03/13/24   Darci Pore, MD  HYDROmorphone  (DILAUDID ) 2 MG tablet Take 0.5 tablets (1 mg total) by mouth every 6 (six) hours as needed for severe pain (pain score 7-10). 03/12/24   Darci Pore, MD  levothyroxine  (SYNTHROID ) 125 MCG tablet TAKE 1 TABLET BY MOUTH 30 MINUTES BEFORE BREAKFAST (STOP DOSE) 12/14/23 12/13/24  Mercer Clotilda SAUNDERS, MD  omeprazole (PRILOSEC) 40 MG capsule Take 40 mg by mouth daily. 02/29/24   [provider]     Family History  Problem Relation Age of Onset   Heart disease Mother    Hypertension Mother    Diabetes Mother    Colon cancer Neg Hx     Esophageal cancer Neg Hx    Pancreatic cancer Neg Hx    Liver disease Neg Hx    Rectal cancer Neg Hx    Stomach cancer Neg Hx     Social History   Socioeconomic History   Marital status: Single    Spouse name: Not on file   Number of children: Not on file   Years of education: Not on file   Highest education level: Some college, no degree  Occupational History   Not on file  Tobacco Use   Smoking status: Never   Smokeless tobacco: Never  Substance and Sexual Activity   Alcohol use: Yes    Comment: socially   Drug use: Never   Sexual activity: Not on file  Other Topics Concern   Not on file  Social History Narrative   Married wife Martice Doty DPR    From [place] been here since 53 y.o    Works Product/process development scientist [employer]    No tobacco, drugs, occas. etoh    8 siblings   Social Drivers of Corporate investment banker Strain: Medium Risk (04/25/2022)   Overall Financial Resource Strain (CARDIA)    Difficulty of Paying Living Expenses: Somewhat hard  Food Insecurity: Food Insecurity Present (03/14/2024)   Hunger Vital Sign    Worried About Running Out of Food in the Last Year: Sometimes true    Ran Out of Food in the Last Year:  Sometimes true  Transportation Needs: No Transportation Needs (03/14/2024)   PRAPARE - Administrator, Civil Service (Medical): No    Lack of Transportation (Non-Medical): No  Physical Activity: Sufficiently Active (04/25/2022)   Exercise Vital Sign    Days of Exercise per Week: 6 days    Minutes of Exercise per Session: 30 min  Stress: No Stress Concern Present (04/25/2022)   [employer] of Occupational Health - Occupational Stress Questionnaire    Feeling of Stress : Not at all  Social Connections: Moderately Isolated (04/25/2022)   Social Connection and Isolation Panel    Frequency of Communication with Friends and Family: More than three times a week    Frequency of Social Gatherings with Friends and Family: More than three  times a week    Attends Religious Services: Never    Database administrator or Organizations: No    Attends Engineer, structural: Not on file    Marital Status: Living with partner       Review of Systems: Denies fever, headache, chest pain, dyspnea, cough, abdominal pain, nausea, vomiting or bleeding.  He does have some back pain.  Vital Signs: Vitals:   03/17/24 1230  BP: (!) 147/97  Pulse: 72  Resp: 18  Temp: 98.3 F (36.8 C)  SpO2: 98%      Advance Care Plan: No documents on file  Physical Exam: Awake, alert.  Chest clear to auscultation bilaterally.  Heart with regular rate and rhythm.  Abdomen soft, positive bowel sounds, nontender.  No lower extremity edema  Imaging: US  BIOPSY (LIVER) Result Date: 03/11/2024 INDICATION: 53 year old male with history of multifocal liver masses suspicious for metastasis. EXAM: ULTRASOUND GUIDED LIVER LESION BIOPSY COMPARISON:  None Available. MEDICATIONS: None ANESTHESIA/SEDATION: Fentanyl  100 mcg IV; Versed  2 mg IV Total Moderate Sedation time:  17 minutes. The patient's level of consciousness and vital signs were monitored continuously by radiology nursing throughout the procedure under my direct supervision. COMPLICATIONS: None immediate. PROCEDURE: Informed written consent was obtained from the patient after a discussion of the risks, benefits and alternatives to treatment. The patient understands and consents the procedure. A timeout was performed prior to the initiation of the procedure. Ultrasound scanning was performed of the right upper abdominal quadrant demonstrates ill-defined hypoechoic mass in in the left lobe of the liver. The left lobe liver mass was selected for biopsy and the procedure was planned. The right upper abdominal quadrant was prepped and draped in the usual sterile fashion. The overlying soft tissues were anesthetized with 1% lidocaine  with epinephrine . A 17 gauge, 6.8 cm co-axial needle was advanced into a  peripheral aspect of the lesion. This was followed by 3 core biopsies with an 18 gauge core device under direct ultrasound guidance. The coaxial needle tract was embolized with a small amount of Gel-Foam slurry and superficial hemostasis was obtained with manual compression. Post procedural scanning was negative for definitive area of hemorrhage or additional complication. A dressing was placed. The patient tolerated the procedure well without immediate post procedural complication. IMPRESSION: Technically successful ultrasound guided core needle biopsy of left lobe liver mass. Ester Sides, MD [facility] Electronically Signed   By: Ester Sides M.D.   On: 03/11/2024 16:20   CT ABDOMEN PELVIS W WO CONTRAST Result Date: 03/09/2024 CLINICAL DATA:  Liver mass on right upper quadrant ultrasound, chest pain. * Tracking Code: BO * EXAM: CT CHEST WITH CONTRAST CT ABDOMEN WITHOUT AND WITH CONTRAST  TECHNIQUE: Multidetector CT imaging of the abdomen was performed without intravenous contrast. Multidetector CT imaging of the chest and abdomen was then performed during bolus administration of intravenous contrast. RADIATION DOSE REDUCTION: This exam was performed according to the departmental dose-optimization program which includes automated exposure control, adjustment of the mA and/or kV according to patient size and/or use of iterative reconstruction technique. CONTRAST:  75mL OMNIPAQUE  IOHEXOL  350 MG/ML SOLN COMPARISON:  Ultrasound abdomen from earlier the same day. FINDINGS: CT CHEST FINDINGS Cardiovascular: Normal cardiac size. No pericardial effusion. No aortic aneurysm. There are coronary artery calcifications, in keeping with coronary artery disease. Mediastinum/Nodes: Visualized thyroid  gland appears grossly unremarkable. No solid / cystic mediastinal masses. The esophagus is nondistended precluding optimal assessment. No axillary, mediastinal or hilar  lymphadenopathy by size criteria. Lungs/Pleura: The central tracheo-bronchial tree is patent. There are dependent changes in bilateral lungs. No mass or consolidation. No pleural effusion or pneumothorax. No suspicious lung nodules. Musculoskeletal: The visualized soft tissues of the chest wall are grossly unremarkable. No suspicious osseous lesions. CT ABDOMEN PELVIS FINDINGS Hepatobiliary: The liver is normal in size. There is liver surface irregularity/nodularity, suggesting cirrhosis cirrhotic appearance due to underlying liver lesions described as follows. There are multiple (more than 15), ill-defined hypoattenuating masses throughout the liver with largest in the left hepatic lobe, segment 4 B/3 junction region measuring up to 3.0 x 3.2 cm. No intrahepatic or extrahepatic bile duct dilation. No calcified gallstones. Normal gallbladder wall thickness. No pericholecystic inflammatory changes. Pancreas: There is an ill-defined, hypoenhancing, approximately 1.6 x 1.9 cm lesion in the pancreatic head, which is highly concerning for pancreatic malignancy. There is resultant moderate upstream main pancreatic duct dilation, measuring up to 8 mm in the neck region. No peripancreatic fat stranding. There is resultant up to moderate narrowing of the portal vein at the level of confluence of SMV/splenic veins (series 7, image 66). Rest of the main portal vein and its tributaries are otherwise normal in caliber and patent. Unremarkable hepatic veins. There is subtle fat stranding surrounding the pancreatic head/uncinate process and neck region, which is nonspecific but can be seen with acute interstitial pancreatitis. Correlate clinically and with serum lipase levels. Spleen: Within normal limits. No focal lesion. Adrenals/Urinary Tract: There are bilateral adrenal adenomas. On the left it measures up to 1.0 x 1.6 cm and on the right it measures up to 0.9 x 1.2 cm. No suspicious renal mass. There is a 1.3 x 1.6 cm simple  cyst arising from the left kidney upper pole. There is a 6 x 10 mm nonobstructing calculus in the left kidney upper pole and an additional 2 mm nonobstructing calculus in the left kidney lower pole. No other nephroureterolithiasis on either side. No obstructive uropathy on either side. Urinary bladder is under distended, precluding optimal assessment. However, no large mass or stones identified. No perivesical fat stranding. Stomach/Bowel: No disproportionate dilation of the small or large bowel loops. No evidence of abnormal bowel wall thickening. There is subtle fat stranding surrounding the proximal duodenum, which is nonspecific but typically seen as sequela of pancreatitis or duodenitis. Correlate clinically. The appendix is unremarkable. Vascular/Lymphatic: No ascites or pneumoperitoneum. No abdominal or pelvic lymphadenopathy, by size criteria. No aneurysmal dilation of the major abdominal arteries. Reproductive: Normal size prostate. Symmetric seminal vesicles. Other: There is a tiny fat containing umbilical hernia. There are bilateral small fat containing inguinal hernias. Musculoskeletal: No suspicious osseous lesions. There are mild multilevel degenerative changes in the visualized spine. IMPRESSION: 1. There is an  ill-defined, hypoenhancing, approximately 1.6 x 1.9 cm lesion in the pancreatic head, which is highly concerning for pancreatic malignancy. There is resultant moderate upstream main pancreatic duct dilation, measuring up to 8 mm in the neck region. There is also resultant up to moderate narrowing of the portal vein at the level of confluence of SMV/splenic veins. 2. There are multiple (more than 15), ill-defined hypoattenuating masses throughout the liver with largest in the left hepatic lobe, segment 4B/3 junction region measuring up to 3.0 x 3.2 cm. These are compatible with metastases. 3. There is subtle fat stranding surrounding the pancreatic head/uncinate process and neck region, which is  nonspecific but can be seen with acute interstitial pancreatitis. There is subtle fat stranding surrounding the proximal duodenum, which is typically seen as sequela of pancreatitis or duodenitis. 4. No metastatic disease identified within the chest. 5. Multiple other nonacute observations, as described above. Aortic Atherosclerosis (ICD10-I70.0). Electronically Signed   By: Ree Molt M.D.   On: 03/09/2024 10:17   CT CHEST W CONTRAST Result Date: 03/09/2024 CLINICAL DATA:  Liver mass on right upper quadrant ultrasound, chest pain. * Tracking Code: BO * EXAM: CT CHEST WITH CONTRAST CT ABDOMEN WITHOUT AND WITH CONTRAST TECHNIQUE: Multidetector CT imaging of the abdomen was performed without intravenous contrast. Multidetector CT imaging of the chest and abdomen was then performed during bolus administration of intravenous contrast. RADIATION DOSE REDUCTION: This exam was performed according to the departmental dose-optimization program which includes automated exposure control, adjustment of the mA and/or kV according to patient size and/or use of iterative reconstruction technique. CONTRAST:  75mL OMNIPAQUE  IOHEXOL  350 MG/ML SOLN COMPARISON:  Ultrasound abdomen from earlier the same day. FINDINGS: CT CHEST FINDINGS Cardiovascular: Normal cardiac size. No pericardial effusion. No aortic aneurysm. There are coronary artery calcifications, in keeping with coronary artery disease. Mediastinum/Nodes: Visualized thyroid  gland appears grossly unremarkable. No solid / cystic mediastinal masses. The esophagus is nondistended precluding optimal assessment. No axillary, mediastinal or hilar lymphadenopathy by size criteria. Lungs/Pleura: The central tracheo-bronchial tree is patent. There are dependent changes in bilateral lungs. No mass or consolidation. No pleural effusion or pneumothorax. No suspicious lung nodules. Musculoskeletal: The visualized soft tissues of the chest wall are grossly unremarkable. No suspicious  osseous lesions. CT ABDOMEN PELVIS FINDINGS Hepatobiliary: The liver is normal in size. There is liver surface irregularity/nodularity, suggesting cirrhosis cirrhotic appearance due to underlying liver lesions described as follows. There are multiple (more than 15), ill-defined hypoattenuating masses throughout the liver with largest in the left hepatic lobe, segment 4 B/3 junction region measuring up to 3.0 x 3.2 cm. No intrahepatic or extrahepatic bile duct dilation. No calcified gallstones. Normal gallbladder wall thickness. No pericholecystic inflammatory changes. Pancreas: There is an ill-defined, hypoenhancing, approximately 1.6 x 1.9 cm lesion in the pancreatic head, which is highly concerning for pancreatic malignancy. There is resultant moderate upstream main pancreatic duct dilation, measuring up to 8 mm in the neck region. No peripancreatic fat stranding. There is resultant up to moderate narrowing of the portal vein at the level of confluence of SMV/splenic veins (series 7, image 66). Rest of the main portal vein and its tributaries are otherwise normal in caliber and patent. Unremarkable hepatic veins. There is subtle fat stranding surrounding the pancreatic head/uncinate process and neck region, which is nonspecific but can be seen with acute interstitial pancreatitis. Correlate clinically and with serum lipase levels. Spleen: Within normal limits. No focal lesion. Adrenals/Urinary Tract: There are bilateral adrenal adenomas. On the left it measures  up to 1.0 x 1.6 cm and on the right it measures up to 0.9 x 1.2 cm. No suspicious renal mass. There is a 1.3 x 1.6 cm simple cyst arising from the left kidney upper pole. There is a 6 x 10 mm nonobstructing calculus in the left kidney upper pole and an additional 2 mm nonobstructing calculus in the left kidney lower pole. No other nephroureterolithiasis on either side. No obstructive uropathy on either side. Urinary bladder is under distended, precluding  optimal assessment. However, no large mass or stones identified. No perivesical fat stranding. Stomach/Bowel: No disproportionate dilation of the small or large bowel loops. No evidence of abnormal bowel wall thickening. There is subtle fat stranding surrounding the proximal duodenum, which is nonspecific but typically seen as sequela of pancreatitis or duodenitis. Correlate clinically. The appendix is unremarkable. Vascular/Lymphatic: No ascites or pneumoperitoneum. No abdominal or pelvic lymphadenopathy, by size criteria. No aneurysmal dilation of the major abdominal arteries. Reproductive: Normal size prostate. Symmetric seminal vesicles. Other: There is a tiny fat containing umbilical hernia. There are bilateral small fat containing inguinal hernias. Musculoskeletal: No suspicious osseous lesions. There are mild multilevel degenerative changes in the visualized spine. IMPRESSION: 1. There is an ill-defined, hypoenhancing, approximately 1.6 x 1.9 cm lesion in the pancreatic head, which is highly concerning for pancreatic malignancy. There is resultant moderate upstream main pancreatic duct dilation, measuring up to 8 mm in the neck region. There is also resultant up to moderate narrowing of the portal vein at the level of confluence of SMV/splenic veins. 2. There are multiple (more than 15), ill-defined hypoattenuating masses throughout the liver with largest in the left hepatic lobe, segment 4B/3 junction region measuring up to 3.0 x 3.2 cm. These are compatible with metastases. 3. There is subtle fat stranding surrounding the pancreatic head/uncinate process and neck region, which is nonspecific but can be seen with acute interstitial pancreatitis. There is subtle fat stranding surrounding the proximal duodenum, which is typically seen as sequela of pancreatitis or duodenitis. 4. No metastatic disease identified within the chest. 5. Multiple other nonacute observations, as described above. Aortic  Atherosclerosis (ICD10-I70.0). Electronically Signed   By: Ree Molt M.D.   On: 03/09/2024 10:17   US  Abdomen Limited RUQ (LIVER/GB) Result Date: 03/09/2024 CLINICAL DATA:  [id number].  Elevated liver enzymes. EXAM: ULTRASOUND ABDOMEN LIMITED RIGHT UPPER QUADRANT COMPARISON:  Ultrasound complete abdomen from 12/23/2018. FINDINGS: Gallbladder: No gallstones or wall thickening visualized. The gallbladder is mildly dilated measuring 11 cm in length. There is a small amount of hypoechoic layering sludge. No sonographic Murphy sign noted by sonographer. Common bile duct: Diameter: Measures prominent at 7.4 mm mild intrahepatic biliary prominence. Liver: Not seen in 2020, there is a heterogeneously hypoechoic solid mass with scattered color flow in the left lobe measuring 3.5 x 2.9 x 3.3 cm, worrisome for primary or metastatic neoplasm. CT or MRI recommended preferably without and with contrast. Otherwise, the liver is within normal limits in parenchymal echogenicity. Portal vein is patent on color Doppler imaging with normal direction of blood flow towards the liver. Other: No right upper quadrant ascites. IMPRESSION: 1. 3.5 x 2.9 x 3.3 cm heterogeneously hypoechoic solid mass in the left lobe of the liver, worrisome for primary or metastatic neoplasm. CT or MRI recommended preferably without and with contrast. 2. Mildly dilated gallbladder with a small amount of sludge. No gallstones or wall thickening. 3. Prominent common bile duct at 7.4 mm with mild intrahepatic biliary prominence. Electronically Signed   By:  Francis Quam M.D.   On: 03/09/2024 07:46   DG Ribs Unilateral W/Chest Left Result Date: 03/09/2024 CLINICAL DATA:  53 year old male with back and rib pain for 1 month. EXAM: LEFT RIBS AND CHEST - 3+ VIEW COMPARISON:  None Available. FINDINGS: PA view of the chest 0457 hours. Low normal lung volumes. Normal cardiac size and mediastinal contours. Visualized tracheal air column is within normal limits.  Both lungs appear clear. No pneumothorax or pleural effusion. Four oblique views of the left ribs. Bone mineralization is within normal limits. 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 EKG leads project over the left chest. No left rib fracture or rib lesion identified. Other visible osseous structures appear intact. Negative visible bowel gas pattern. However, left abdominal oval, crescent-shaped 13 mm calculus is visible on multiple views, suspicious for left nephrolithiasis. IMPRESSION: 1. No acute cardiopulmonary abnormality.  Negative left radiographs. 2. Evidence of bulky 13 mm left renal calculus.  Query renal colic. Electronically Signed   By: VEAR Hurst M.D.   On: 03/09/2024 05:32    Labs:  CBC: Recent Labs    03/09/24 0515 03/11/24 0257 03/12/24 0325 03/14/24 1337  WBC 4.8 4.9 5.9 5.4  HGB 12.0* 12.1* 12.0* 12.4*  HCT 38.0* 37.8* 37.3* 39.1  PLT 169 160 169 195    COAGS: Recent Labs    03/10/24 0604  INR 1.0    BMP: Recent Labs    03/09/24 0515 03/11/24 0257 03/12/24 0325 03/14/24 1337  NA 138 137 136 136  K 3.4* 3.5 3.4* 3.7  CL 101 100 102 99  CO2 26 25 25 26   GLUCOSE 110* 165* 174* 116*  BUN 10 12 9 17   CALCIUM 9.4 8.9 9.0 10.1  CREATININE 1.33* 1.16 1.05 1.15  GFRNONAA >60 >60 >60 >60    LIVER FUNCTION TESTS: Recent Labs    03/09/24 0515 03/11/24 0257 03/12/24 0325 03/14/24 1337  BILITOT 1.5* 2.9* 4.2* 3.4*  AST 108* 339* 298* 208*  ALT 135* 290* 339* 417*  ALKPHOS 130* 164* 196* 305*  PROT 7.5 6.8 7.0 8.4*  ALBUMIN 3.9 3.3* 3.3* 4.3    TUMOR MARKERS: No results for input(s): AFPTM, CEA, CA199, CHROMGRNA in the last 8760 hours.  Assessment and Plan: 53 y.o. male with past medical history significant for GERD, glaucoma, hypothyroidism, hypertension, red cell microcytosis, hyperthyroidism status post RAI in 2009 who presents now with newly diagnosed stage IV pancreatic cancer.  He is scheduled today for Port-A-Cath placement to assist with  palliative treatment.  He is familiar to IR team from left lobe liver mass biopsy on 03/11/2024.Risks and benefits of image guided port-a-catheter placement was discussed with the patient including, but not limited to bleeding, infection, pneumothorax, or fibrin sheath development and need for additional procedures.  All of the patient's questions were answered, patient is agreeable to proceed. Consent signed and in chart.    Thank you for allowing our service to participate in SHAHAN STARKS 's care.  Electronically Signed: D. Franky Rakers, PA-C   03/14/2024, 4:38 PM      I spent a total of  20 minutes   in face to face in clinical consultation, greater than 50% of which was counseling/coordinating care for image guided Port-A-Cath placement

## 2024-03-14 NOTE — Progress Notes (Signed)
 Provided bag of groceries from food pantry.

## 2024-03-14 NOTE — Progress Notes (Signed)
 [facility] New Patient Consult   Requesting MD: Medicine, [facility] [address] [place],  [place] [address]   ELLIOT MELDRUM 53 y.o.  12/30/1970    Reason for Consult: Pancreas cancer   HPI: Mr. Mauriello reports a 1 month history of postprandial subxiphoid discomfort.  The pain occurs after eating solids, but not liquids.  The pain radiates to the left posterior chest. He presented to the emergency room 03/09/2024.  A right upper quadrant ultrasound revealed a 3.5 cm hypoechoic mass in the left liver.  The common bile duct measured 7.4 mm with mild intrahepatic biliary prominence.  A chemistry panel was remarkable for elevated liver enzymes and bilirubin. CTs on 03/09/2024 revealed a 1.6 x 1.9 cm hypoenhancing pancreas head lesion with moderate main pancreatic duct dilation.  Moderate narrowing of the portal vein at the confluence.  Multiple dense liver lesions were noted including a 3 x 3.2 cm left liver lesion.  No intrahepatic or extrahepatic biliary duct dilation.  No ascites or lymphadenopathy.  No evidence of metastatic disease to the chest.  An ultrasound-guided biopsy of a left liver lesion 03/11/2024 confirmed adenocarcinoma.   Past Medical History:  Diagnosis Date   COVID-19    03/17/20   FH: heart disease    GERD (gastroesophageal reflux disease)    Glaucoma    Hyperthyroidism    s/p RAI 2009    Hypothyroidism    Verruca vulgaris     Past Surgical History:  Procedure Laterality Date   ANTERIOR CRUCIATE LIGAMENT REPAIR Right 2000   HAND SURGERY Right 2001   with hardware implanted   NO PAST SURGERIES      Medications: Reviewed  Allergies: No Known Allergies  Family history: A sister had colon cancer in her 62s  Social History:   He lives alone in [place].  He is divorced.  He works for Product/process development scientist and a Engineer, drilling.  He does not use cigarettes.  He reports occasional alcohol use.  No history of heavy alcohol use.  No risk  factor for HIV or hepatitis.  No transfusion history.  He has 2 children.  ROS:   Positives include: Postprandial subxiphoid and left mid back discomfort, dark urine, light-colored stool  A complete ROS was otherwise negative.  Physical Exam:  Blood pressure (!) 144/99, pulse 83, temperature 97.8 F (36.6 C), resp. rate 18, height 6' 4 (1.93 m), weight 207 lb (93.9 kg), SpO2 100%.  HEENT: Oropharynx without visible mass, neck without mass Lungs: Clear bilaterally Cardiac: Regular rate and rhythm Abdomen: Nontender, no mass, no hepatosplenomegaly GU: Testes without mass Vascular: No leg edema Lymph nodes: No cervical, supraclavicular, axillary, or inguinal nodes Neurologic: Alert and oriented, the motor exam appears intact in the upper and lower extremities bilaterally Skin: No rash, multiple tattoos Musculoskeletal: No spine tenderness   LAB:  CBC  Lab Results  Component Value Date   WBC 5.4 03/14/2024   HGB 12.4 (L) 03/14/2024   HCT 39.1 03/14/2024   MCV 63.0 (L) 03/14/2024   PLT 195 03/14/2024   NEUTROABS 4.0 03/14/2024        CMP  Lab Results  Component Value Date   NA 136 03/14/2024   K 3.7 03/14/2024   CL 99 03/14/2024   CO2 26 03/14/2024   GLUCOSE 116 (H) 03/14/2024   BUN 17 03/14/2024   CREATININE 1.15 03/14/2024   CALCIUM 10.1 03/14/2024   PROT 8.4 (H) 03/14/2024   ALBUMIN 4.3 03/14/2024  AST 208 (HH) 03/14/2024   ALT 417 (HH) 03/14/2024   ALKPHOS 305 (H) 03/14/2024   BILITOT 3.4 (H) 03/14/2024   GFRNONAA >60 03/14/2024   GFRAA 83 04/11/2019     Lab Results  Component Value Date   CEA1 43.8 (H) 03/10/2024   RJW800 20,185 (H) 03/10/2024    Imaging: 03/09/2024 CT images reviewed with Mr. Castiglia   Assessment/Plan:   Pancreas cancer, stage IV (cT1c, cN0, pM1) 03/09/2024 CTs chest, abdomen, and pelvis: 1.6 x 1.9 cm pancreas head mass, upstream pancreatic duct dilation, multiple hypoattenuating liver lesions, fat stranding surrounding the  pancreas head and proximal duodenum, no metastatic disease in the chest Ultrasound-guided biopsy of left liver mass 03/11/2024: Invasive moderately differentiated pancreatic adenocarcinoma, pancreas with severe ductal dysplasia (PanIN-3), no hepatic parenchyma is present Elevated CA 19-9 Elevated liver enzymes and bilirubin Pain secondary to #1 Hypothyroidism Family history of colon cancer Red cell microcytosis Hyperthyroidism status post RAI in 2009 Gastroesophageal reflux disease Glaucoma Hypertension   Disposition:   Mr. Becvar has been diagnosed with stage IV pancreas cancer.  I reviewed the CT images and biopsy result with him.  He underwent an ultrasound-guided biopsy of a left liver lesion, but no liver tissue was seen on the biopsy.  The biopsy consisted of adenocarcinoma and pancreas with dysplastic changes.  I will clarify the pathology result with the pathologist and interventional radiology.  The clinical presentation is consistent with metastatic pancreas cancer.  He understands no therapy will be curative.  The goals of treatment are to palliate symptoms and potentially extend survival.  We discussed treatment options.  I recommend FOLFIRINOX.  We reviewed potential toxicities associated with the FOLFIRINOX regimen including the chance of nausea/vomiting, mucositis, alopecia, diarrhea, hematologic toxicity, infection, and bleeding.  We discussed the sun sensitivity, rash, hyperpigmentation, cardiac toxicity, and hand/foot syndrome associated with 5-fluorouracil.  We discussed the acute and delayed diarrhea seen with irinotecan.  We discussed the allergic reaction and various types of neuropathy associated with oxaliplatin.  He agrees to proceed.  He will attend a chemotherapy teaching class.  The liver enzymes and bilirubin are elevated, likely secondary to the hepatic tumor burden.  No biliary obstruction was noted on the 03/09/2024 CT.  We will repeat a liver panel prior to  beginning chemotherapy.  Irinotecan will be held if the bilirubin and liver enzymes remain markedly elevated.  The biopsy tissue has been submitted for NGS testing.  He may be a candidate for a K-ras inhibitor trial if the tumor harbors a K-ras mutation.  Germline DNA testing was obtained today.  Mr Skowronek is scheduled for Port-A-Cath placement on 03/17/2024.  He will be scheduled for an office visit and cycle 1 FOLFIRINOX on 03/24/2024.  A treatment plan was entered today.  I will present his case at the GI tumor conference.  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  We checked a hemoglobin electrophoresis today.  Arley Hof, MD  03/14/2024, 3:27 PM

## 2024-03-14 NOTE — Progress Notes (Signed)
 PATIENT NAVIGATOR PROGRESS NOTE  Name: Douglas Harper Date: 03/14/2024 MRN: [id number]  DOB: September 20, 1970   Reason for visit:  New patient appt  Comments:    Referrals to SW Referral to Nutrition Referral to Genetics, Vaughn kit drawn today Foundation one testing requested from liver biopsy Port placement scheduled for Monday 9/29 at [facility], written instructions given FOLFIRINOX information given     Time spent counseling/coordinating care: > 60 minutes

## 2024-03-14 NOTE — Progress Notes (Signed)
START ON PATHWAY REGIMEN - Pancreatic Adenocarcinoma     A cycle is every 14 days:     Irinotecan      Oxaliplatin      Leucovorin      Fluorouracil   **Always confirm dose/schedule in your pharmacy ordering system**  Patient Characteristics: Metastatic Disease, First Line, PS = 0,1, BRCA1/2 and PALB2  Mutation Absent/Unknown Therapeutic Status: Metastatic Disease Line of Therapy: First Line ECOG Performance Status: 1 BRCA1/2 Mutation Status: Awaiting Test Results PALB2 Mutation Status: Awaiting Test Results Intent of Therapy: Non-Curative / Palliative Intent, Discussed with Patient 

## 2024-03-14 NOTE — Telephone Encounter (Signed)
 Blood drawn for POC testing for pancreatic cancer.  Will submit a TRF order for CancerNext-Expanded+RNAinsight panel.  Darice Monte, MS, CGC  Licensed, Patent attorney [email or web address] phone: [phone]

## 2024-03-16 ENCOUNTER — Other Ambulatory Visit: Payer: Self-pay

## 2024-03-17 ENCOUNTER — Encounter: Payer: Self-pay | Admitting: Oncology

## 2024-03-17 ENCOUNTER — Other Ambulatory Visit: Payer: Self-pay

## 2024-03-17 ENCOUNTER — Encounter: Payer: Self-pay | Admitting: Nutrition

## 2024-03-17 ENCOUNTER — Encounter (HOSPITAL_COMMUNITY): Payer: Self-pay

## 2024-03-17 ENCOUNTER — Ambulatory Visit (HOSPITAL_COMMUNITY)
Admission: RE | Admit: 2024-03-17 | Discharge: 2024-03-17 | Disposition: A | Source: Ambulatory Visit | Attending: Oncology

## 2024-03-17 ENCOUNTER — Ambulatory Visit (HOSPITAL_COMMUNITY)
Admission: RE | Admit: 2024-03-17 | Discharge: 2024-03-17 | Disposition: A | Discharge status: Home / Self Care | Source: Ambulatory Visit | Attending: Oncology | Admitting: Oncology

## 2024-03-17 DIAGNOSIS — C259 Malignant neoplasm of pancreas, unspecified: Secondary | ICD-10-CM | POA: Diagnosis present

## 2024-03-17 DIAGNOSIS — H409 Unspecified glaucoma: Secondary | ICD-10-CM | POA: Diagnosis not present

## 2024-03-17 DIAGNOSIS — I10 Essential (primary) hypertension: Secondary | ICD-10-CM | POA: Insufficient documentation

## 2024-03-17 DIAGNOSIS — E039 Hypothyroidism, unspecified: Secondary | ICD-10-CM | POA: Diagnosis not present

## 2024-03-17 DIAGNOSIS — K219 Gastro-esophageal reflux disease without esophagitis: Secondary | ICD-10-CM | POA: Diagnosis not present

## 2024-03-17 HISTORY — PX: IR IMAGING GUIDED PORT INSERTION: IMG5740

## 2024-03-17 HISTORY — DX: Essential (primary) hypertension: I10

## 2024-03-17 MED ORDER — DIPHENHYDRAMINE HCL 50 MG/ML IJ SOLN
INTRAMUSCULAR | Status: AC | PRN
  Administered 2024-03-17: 25 mg via INTRAVENOUS

## 2024-03-17 MED ORDER — FENTANYL CITRATE (PF) 100 MCG/2ML IJ SOLN
INTRAMUSCULAR | Status: AC | PRN
  Administered 2024-03-17: 50 ug via INTRAVENOUS

## 2024-03-17 MED ORDER — LIDOCAINE HCL 1 % IJ SOLN
INTRAMUSCULAR | Status: AC
  Filled 2024-03-17: qty 20

## 2024-03-17 MED ORDER — HEPARIN SOD (PORK) LOCK FLUSH 100 UNIT/ML IV SOLN
500.0000 [IU] | Freq: Once | INTRAVENOUS | Status: AC
  Administered 2024-03-17: 500 [IU] via INTRAVENOUS

## 2024-03-17 MED ORDER — MIDAZOLAM HCL 2 MG/2ML IJ SOLN
INTRAMUSCULAR | Status: AC | PRN
  Administered 2024-03-17: 1 mg via INTRAVENOUS

## 2024-03-17 MED ORDER — HEPARIN SOD (PORK) LOCK FLUSH 100 UNIT/ML IV SOLN
INTRAVENOUS | Status: AC
  Filled 2024-03-17: qty 5

## 2024-03-17 MED ORDER — FENTANYL CITRATE (PF) 100 MCG/2ML IJ SOLN
INTRAMUSCULAR | Status: AC
  Filled 2024-03-17: qty 2

## 2024-03-17 MED ORDER — LIDOCAINE HCL 1 % IJ SOLN
20.0000 mL | Freq: Once | INTRAMUSCULAR | Status: AC
  Administered 2024-03-17: 16 mL via INTRADERMAL

## 2024-03-17 MED ORDER — DIPHENHYDRAMINE HCL 50 MG/ML IJ SOLN
INTRAMUSCULAR | Status: AC
Start: 2024-03-17 — End: 2024-03-17
  Filled 2024-03-17: qty 1

## 2024-03-17 MED ORDER — SODIUM CHLORIDE 0.9 % IV SOLN: INTRAVENOUS | Status: DC

## 2024-03-17 MED ORDER — MIDAZOLAM HCL 2 MG/2ML IJ SOLN
INTRAMUSCULAR | Status: AC
  Filled 2024-03-17: qty 2

## 2024-03-17 NOTE — Procedures (Signed)
 Interventional Radiology Procedure Note  Procedure: Chest port placement  Complications: None  Estimated Blood Loss: < 10 mL  Findings: LIJ chest port placement.Douglas Cordella DELENA Jenna, MD

## 2024-03-17 NOTE — Sedation Documentation (Signed)
 RN Simren Popson pulled 50 mg Benadryl , 2 mg Versed , and 100 mcg Fentanyl  in Ir room pysix. Pt. Received 25 mg Benadryl , 2 mg Versed , and 100 mcg Fentanyl  throughout the procedure.

## 2024-03-17 NOTE — Discharge Instructions (Signed)
Discharge Instructions:   Please call Interventional Radiology clinic [phone] with any questions or concerns.  You may remove your dressing and shower tomorrow.  Do not use EMLA / Lidocaine cream for 2 weeks post Port Insertion this will remove the surgical glue.  Moderate Conscious Sedation, Adult, Care After This sheet gives you information about how to care for yourself after your procedure. Your health care provider may also give you more specific instructions. If you have problems or questions, contact your health care provider. What can I expect after the procedure? After the procedure, it is common to have: Sleepiness for several hours. Impaired judgment for several hours. Difficulty with balance. Vomiting if you eat too soon. Follow these instructions at home: For the time period you were told by your health care provider: Rest. Do not participate in activities where you could fall or become injured. Do not drive or use machinery. Do not drink alcohol. Do not take sleeping pills or medicines that cause drowsiness. Do not make important decisions or sign legal documents. Do not take care of children on your own. Eating and drinking  Follow the diet recommended by your health care provider. Drink enough fluid to keep your urine pale yellow. If you vomit: Drink water, juice, or soup when you can drink without vomiting. Make sure you have little or no nausea before eating solid foods. General instructions Take over-the-counter and prescription medicines only as told by your health care provider. Have a responsible adult stay with you for the time you are told. It is important to have someone help care for you until you are awake and alert. Do not smoke. Keep all follow-up visits as told by your health care provider. This is important. Contact a health care provider if: You are still sleepy or having trouble with balance after 24 hours. You feel light-headed. You keep  feeling nauseous or you keep vomiting. You develop a rash. You have a fever. You have redness or swelling around the IV site. Get help right away if: You have trouble breathing. You have new-onset confusion at home. Summary After the procedure, it is common to feel sleepy, have impaired judgment, or feel nauseous if you eat too soon. Rest after you get home. Know the things you should not do after the procedure. Follow the diet recommended by your health care provider and drink enough fluid to keep your urine pale yellow. Get help right away if you have trouble breathing or new-onset confusion at home. This information is not intended to replace advice given to you by your health care provider. Make sure you discuss any questions you have with your health care provider. Document Revised: 10/03/2019 Document Reviewed: 05/01/2019 Elsevier Patient Education  2023 Elsevier Inc.  Implanted Port Insertion, Care After The following information offers guidance on how to care for yourself after your procedure. Your health care provider may also give you more specific instructions. If you have problems or questions, contact your health care provider. What can I expect after the procedure? After the procedure, it is common to have: Discomfort at the port insertion site. Bruising on the skin over the port. This should improve over 3-4 days. Follow these instructions at home: Port care After your port is placed, you will get a manufacturer's information card. The card has information about your port. Keep this card with you at all times. Take care of the port as told by your health care provider. Ask your health care provider if you or   a family member can get training for taking care of the port at home. A home health care nurse will be be available to help care for the port. Make sure to remember what type of port you have. Incision care     Follow instructions from your health care provider  about how to take care of your port insertion site. Make sure you: Wash your hands with soap and water for at least 20 seconds before and after you change your bandage (dressing). If soap and water are not available, use hand sanitizer. Change your dressing as told by your health care provider. Leave stitches (sutures), skin glue, or adhesive strips in place. These skin closures may need to stay in place for 2 weeks or longer. If adhesive strip edges start to loosen and curl up, you may trim the loose edges. Do not remove adhesive strips completely unless your health care provider tells you to do that. Check your port insertion site every day for signs of infection. Check for: Redness, swelling, or pain. Fluid or blood. Warmth. Pus or a bad smell. Activity Return to your normal activities as told by your health care provider. Ask your health care provider what activities are safe for you. You may have to avoid lifting. Ask your health care provider how much you can safely lift. General instructions Take over-the-counter and prescription medicines only as told by your health care provider. Do not take baths, swim, or use a hot tub until your health care provider approves. Ask your health care provider if you may take showers. You may only be allowed to take sponge baths. If you were given a sedative during the procedure, it can affect you for several hours. Do not drive or operate machinery until your health care provider says that it is safe. Wear a medical alert bracelet in case of an emergency. This will tell any health care providers that you have a port. Keep all follow-up visits. This is important. Contact a health care provider if: You cannot flush your port with saline as directed, or you cannot draw blood from the port. You have a fever or chills. You have redness, swelling, or pain around your port insertion site. You have fluid or blood coming from your port insertion site. Your port  insertion site feels warm to the touch. You have pus or a bad smell coming from the port insertion site. Get help right away if: You have chest pain or shortness of breath. You have bleeding from your port that you cannot control. These symptoms may be an emergency. Get help right away. Call 911. Do not wait to see if the symptoms will go away. Do not drive yourself to the hospital. Summary Take care of the port as told by your health care provider. Keep the manufacturer's information card with you at all times. Change your dressing as told by your health care provider. Contact a health care provider if you have a fever or chills or if you have redness, swelling, or pain around your port insertion site. Keep all follow-up visits. This information is not intended to replace advice given to you by your health care provider. Make sure you discuss any questions you have with your health care provider. Document Revised: 12/07/2020 Document Reviewed: 12/07/2020 Elsevier Patient Education  2023 Elsevier Inc.  

## 2024-03-17 NOTE — Progress Notes (Signed)
 1500 Ice pack given to use prn to left upper neck and left upper chest for comfort.

## 2024-03-18 ENCOUNTER — Other Ambulatory Visit: Payer: Self-pay | Admitting: Nurse Practitioner

## 2024-03-18 ENCOUNTER — Telehealth: Payer: Self-pay

## 2024-03-18 ENCOUNTER — Other Ambulatory Visit: Payer: Self-pay

## 2024-03-18 DIAGNOSIS — C259 Malignant neoplasm of pancreas, unspecified: Secondary | ICD-10-CM

## 2024-03-18 MED ORDER — HYDROMORPHONE HCL 2 MG PO TABS: 1.0000 mg | ORAL_TABLET | Freq: Four times a day (QID) | ORAL | 0 refills | Status: DC | PRN

## 2024-03-18 NOTE — Telephone Encounter (Signed)
 Patient called and request a refill of his Diaudid 2 mg, I placed the request on the Np's desk.

## 2024-03-19 ENCOUNTER — Inpatient Hospital Stay: Attending: Oncology

## 2024-03-19 ENCOUNTER — Encounter: Payer: Self-pay | Admitting: Oncology

## 2024-03-19 ENCOUNTER — Other Ambulatory Visit: Payer: Self-pay

## 2024-03-19 DIAGNOSIS — K219 Gastro-esophageal reflux disease without esophagitis: Secondary | ICD-10-CM | POA: Insufficient documentation

## 2024-03-19 DIAGNOSIS — C787 Secondary malignant neoplasm of liver and intrahepatic bile duct: Secondary | ICD-10-CM | POA: Insufficient documentation

## 2024-03-19 DIAGNOSIS — Z5111 Encounter for antineoplastic chemotherapy: Secondary | ICD-10-CM | POA: Insufficient documentation

## 2024-03-19 DIAGNOSIS — G893 Neoplasm related pain (acute) (chronic): Secondary | ICD-10-CM | POA: Insufficient documentation

## 2024-03-19 DIAGNOSIS — C250 Malignant neoplasm of head of pancreas: Secondary | ICD-10-CM | POA: Insufficient documentation

## 2024-03-19 DIAGNOSIS — R978 Other abnormal tumor markers: Secondary | ICD-10-CM | POA: Insufficient documentation

## 2024-03-19 DIAGNOSIS — R748 Abnormal levels of other serum enzymes: Secondary | ICD-10-CM | POA: Insufficient documentation

## 2024-03-19 DIAGNOSIS — Z800 Family history of malignant neoplasm of digestive organs: Secondary | ICD-10-CM | POA: Insufficient documentation

## 2024-03-19 DIAGNOSIS — H409 Unspecified glaucoma: Secondary | ICD-10-CM | POA: Insufficient documentation

## 2024-03-19 DIAGNOSIS — I10 Essential (primary) hypertension: Secondary | ICD-10-CM | POA: Insufficient documentation

## 2024-03-19 DIAGNOSIS — D509 Iron deficiency anemia, unspecified: Secondary | ICD-10-CM | POA: Insufficient documentation

## 2024-03-19 DIAGNOSIS — E039 Hypothyroidism, unspecified: Secondary | ICD-10-CM | POA: Insufficient documentation

## 2024-03-19 DIAGNOSIS — K831 Obstruction of bile duct: Secondary | ICD-10-CM | POA: Insufficient documentation

## 2024-03-19 NOTE — Progress Notes (Signed)
 The proposed treatment discussed in conference is for discussion purpose only and is not a binding recommendation.  The patients have not been physically examined, or presented with their treatment options.  Therefore, final treatment plans cannot be decided.

## 2024-03-19 NOTE — Progress Notes (Signed)
 [facility] Clinical Social Work  Initial Assessment   Douglas Harper is a 53 y.o. year old male contacted by phone. Clinical Social Work was referred by nurse for SDOH needs and assessment of psychosocial needs.   SDOH (Social Determinants of Health) assessments performed: No SDOH Interventions    Flowsheet Row Documentation from 03/17/2024 in [facility] - A Dept Of [place]. [facility] Office Visit from 03/14/2024 in [facility] - A Dept Of . [facility]  SDOH Interventions    Food Insecurity Interventions [facility] Food Pantry --  [Provided bag of groceries from pantry and CSW referral]  Housing Interventions -- Intervention Not Indicated  Transportation Interventions -- Intervention Not Indicated  Utilities Interventions -- Intervention Not Indicated    SDOH Screenings   Food Insecurity: Food Insecurity Present (03/17/2024)  Housing: Low Risk  (03/14/2024)  Transportation Needs: No Transportation Needs (03/14/2024)  Utilities: Not At Risk (03/14/2024)  Alcohol Screen: Low Risk  (04/25/2022)  Depression (PHQ2-9): Low Risk  (03/14/2024)  Financial Resource Strain: Medium Risk (04/25/2022)  Physical Activity: Sufficiently Active (04/25/2022)  Social Connections: Moderately Isolated (04/25/2022)  Stress: No Stress Concern Present (04/25/2022)  Tobacco Use: Low Risk  (03/17/2024)    PHQ 2/9:    03/14/2024    2:15 PM 03/14/2024    1:56 PM 07/19/2022    3:27 PM  Depression screen PHQ 2/9  Decreased Interest 0 0 0  Down, Depressed, Hopeless 0 0 0  PHQ - 2 Score 0 0 0     Distress Screen completed: No     No data to display            Family/Social Information:  Housing Arrangement: patient lives alonein [place]. Family members/support persons in your life? Douglas Harper named an extensive support system that includes his sister,a nurse,his friends, and a peer that also had a diagnosis of Stage III or Stage IV of Pancreatic  Cancer. Transportation concerns: No transportation concerns noted at time of assessment.  Douglas Harper will be transported by a member of his support system. Employment: Douglas Harper in employed but is not currently working due to the diagnosis. Patient's employer does not offer any short term disability benefits. Patient's niece is assisting with filling a SSDI application.   Income source: Not working at this time.  Financial concerns: No financial concerns reported to CSW at this time. SDOH screener shows potential food insecurity.  Type of concern: Food Food access concerns: yes, [facility] food bags provided. Religious or spiritual practice: Yes-Christian. Douglas Harper described how his faith is helping him manage the diagnosis.  Advanced directives: No Services Currently in place:  Insurance, Family, Transportation   Coping/ Adjustment to diagnosis: Patient understands treatment plan and what happens next? yes, patient understands the goal of treatment, understands his treatment schedule, and knows chemo education is on 10/03 Concerns about diagnosis and/or treatment: Douglas Harper denied any concerns about initiating treatment . Douglas Harper stated the diagnosis was a shock, but feels that he's managing it right now due his faith. Douglas Harper denied any changes in his mood, attributes this strength to being a positive person.  Patient reported stressors: No reported stressors at this time.  Patient enjoys exercise and basketball Current coping skills/ strengths: Ability for insight , Active sense of humor , Average or above average intelligence , Capable of independent living , Communication skills , General fund of knowledge , Motivation for treatment/growth , Physical Health , Religious Affiliation , and Supportive family/friends  SUMMARY: Current SDOH Barriers:  Food Insecurity reported - Engineer, maintenance utilized.  Clinical Social Work Clinical Goal(s):  No clinical social work goals at this  time  Interventions: Discussed common feeling and emotions when being diagnosed with cancer, and the importance of support during treatment. Informed patient of the support team roles and support services at [facility] (GI Support Group, Adjustment Counseling, and Support Programming) Provided CSW contact information and encouraged patient to call with any questions or concerns  Follow Up Plan: CSW will follow-up with patient by phone  on 11/04 after 3rd treatment. Patient verbalizes understanding of plan: Yes    Lizbeth Sprague, LCSW Clinical Social Worker [facility] [phone]

## 2024-03-20 ENCOUNTER — Other Ambulatory Visit (HOSPITAL_BASED_OUTPATIENT_CLINIC_OR_DEPARTMENT_OTHER): Payer: Self-pay

## 2024-03-20 ENCOUNTER — Other Ambulatory Visit: Payer: Self-pay

## 2024-03-20 DIAGNOSIS — C259 Malignant neoplasm of pancreas, unspecified: Secondary | ICD-10-CM

## 2024-03-20 MED ORDER — LIDOCAINE-PRILOCAINE 2.5-2.5 % EX CREA
1.0000 | TOPICAL_CREAM | CUTANEOUS | 0 refills | Status: AC | PRN
  Filled 2024-03-20: qty 30, 30d supply, fill #0

## 2024-03-20 MED ORDER — ONDANSETRON HCL 4 MG PO TABS
4.0000 mg | ORAL_TABLET | Freq: Three times a day (TID) | ORAL | 0 refills | Status: AC | PRN
  Filled 2024-03-20: qty 20, 7d supply, fill #0

## 2024-03-20 MED ORDER — PROCHLORPERAZINE MALEATE 10 MG PO TABS: 10.0000 mg | ORAL_TABLET | Freq: Four times a day (QID) | ORAL | Status: DC | PRN

## 2024-03-21 ENCOUNTER — Inpatient Hospital Stay

## 2024-03-21 ENCOUNTER — Other Ambulatory Visit (HOSPITAL_BASED_OUTPATIENT_CLINIC_OR_DEPARTMENT_OTHER): Payer: Self-pay

## 2024-03-21 ENCOUNTER — Other Ambulatory Visit: Payer: Self-pay

## 2024-03-21 MED ORDER — PROCHLORPERAZINE MALEATE 10 MG PO TABS
10.0000 mg | ORAL_TABLET | Freq: Four times a day (QID) | ORAL | 0 refills | Status: DC | PRN
  Filled 2024-03-21: qty 30, 8d supply, fill #0

## 2024-03-21 MED ORDER — LEVOTHYROXINE SODIUM 125 MCG PO TABS
125.0000 ug | ORAL_TABLET | Freq: Every morning | ORAL | 1 refills | Status: DC
  Filled 2024-03-21: qty 90, 90d supply, fill #0

## 2024-03-21 NOTE — Progress Notes (Signed)
 PATIENT NAVIGATOR PROGRESS NOTE  Name: Douglas Harper Date: 03/21/2024 MRN: [id number]  DOB: Feb 17, 1971   Reason for visit:  Education session  Comments:  Please see education note and distress screening    Time spent counseling/coordinating care: > 60 minutes

## 2024-03-23 ENCOUNTER — Other Ambulatory Visit: Payer: Self-pay

## 2024-03-23 ENCOUNTER — Other Ambulatory Visit: Payer: Self-pay | Admitting: Oncology

## 2024-03-24 ENCOUNTER — Inpatient Hospital Stay (HOSPITAL_COMMUNITY)
Admission: AD | Admit: 2024-03-24 | Discharge: 2024-03-26 | DRG: 445 | Disposition: A | Discharge status: Home / Self Care | Attending: Internal Medicine | Admitting: Internal Medicine

## 2024-03-24 ENCOUNTER — Ambulatory Visit (HOSPITAL_BASED_OUTPATIENT_CLINIC_OR_DEPARTMENT_OTHER)
Admission: RE | Admit: 2024-03-24 | Discharge: 2024-03-24 | Disposition: A | Source: Ambulatory Visit | Attending: Nurse Practitioner | Admitting: Nurse Practitioner

## 2024-03-24 ENCOUNTER — Other Ambulatory Visit: Payer: Self-pay

## 2024-03-24 ENCOUNTER — Encounter (HOSPITAL_COMMUNITY): Payer: Self-pay

## 2024-03-24 ENCOUNTER — Encounter (HOSPITAL_COMMUNITY): Payer: Self-pay | Admitting: Internal Medicine

## 2024-03-24 ENCOUNTER — Inpatient Hospital Stay

## 2024-03-24 ENCOUNTER — Inpatient Hospital Stay: Admitting: Nutrition

## 2024-03-24 ENCOUNTER — Encounter: Payer: Self-pay | Admitting: Nurse Practitioner

## 2024-03-24 ENCOUNTER — Inpatient Hospital Stay: Admitting: Nurse Practitioner

## 2024-03-24 VITALS — BP 125/87 | HR 79 | Temp 98.3°F | Resp 18 | Ht 76.0 in | Wt 202.4 lb

## 2024-03-24 DIAGNOSIS — K8021 Calculus of gallbladder without cholecystitis with obstruction: Principal | ICD-10-CM | POA: Diagnosis present

## 2024-03-24 DIAGNOSIS — I10 Essential (primary) hypertension: Secondary | ICD-10-CM | POA: Diagnosis present

## 2024-03-24 DIAGNOSIS — Z79899 Other long term (current) drug therapy: Secondary | ICD-10-CM | POA: Diagnosis not present

## 2024-03-24 DIAGNOSIS — Z59868 Other specified financial insecurity: Secondary | ICD-10-CM

## 2024-03-24 DIAGNOSIS — Z9689 Presence of other specified functional implants: Secondary | ICD-10-CM | POA: Diagnosis not present

## 2024-03-24 DIAGNOSIS — Z5948 Other specified lack of adequate food: Secondary | ICD-10-CM | POA: Diagnosis not present

## 2024-03-24 DIAGNOSIS — C250 Malignant neoplasm of head of pancreas: Secondary | ICD-10-CM | POA: Diagnosis present

## 2024-03-24 DIAGNOSIS — Z833 Family history of diabetes mellitus: Secondary | ICD-10-CM

## 2024-03-24 DIAGNOSIS — H409 Unspecified glaucoma: Secondary | ICD-10-CM | POA: Diagnosis present

## 2024-03-24 DIAGNOSIS — C259 Malignant neoplasm of pancreas, unspecified: Secondary | ICD-10-CM

## 2024-03-24 DIAGNOSIS — Z8249 Family history of ischemic heart disease and other diseases of the circulatory system: Secondary | ICD-10-CM

## 2024-03-24 DIAGNOSIS — R739 Hyperglycemia, unspecified: Secondary | ICD-10-CM | POA: Diagnosis present

## 2024-03-24 DIAGNOSIS — K219 Gastro-esophageal reflux disease without esophagitis: Secondary | ICD-10-CM | POA: Diagnosis present

## 2024-03-24 DIAGNOSIS — N200 Calculus of kidney: Secondary | ICD-10-CM | POA: Diagnosis present

## 2024-03-24 DIAGNOSIS — C787 Secondary malignant neoplasm of liver and intrahepatic bile duct: Secondary | ICD-10-CM | POA: Diagnosis present

## 2024-03-24 DIAGNOSIS — R17 Unspecified jaundice: Secondary | ICD-10-CM | POA: Diagnosis not present

## 2024-03-24 DIAGNOSIS — G8929 Other chronic pain: Secondary | ICD-10-CM | POA: Diagnosis present

## 2024-03-24 DIAGNOSIS — R7303 Prediabetes: Secondary | ICD-10-CM | POA: Diagnosis present

## 2024-03-24 DIAGNOSIS — T85520A Displacement of bile duct prosthesis, initial encounter: Secondary | ICD-10-CM | POA: Diagnosis not present

## 2024-03-24 DIAGNOSIS — Z79890 Hormone replacement therapy (postmenopausal): Secondary | ICD-10-CM | POA: Diagnosis not present

## 2024-03-24 DIAGNOSIS — R948 Abnormal results of function studies of other organs and systems: Secondary | ICD-10-CM | POA: Diagnosis present

## 2024-03-24 DIAGNOSIS — R7401 Elevation of levels of liver transaminase levels: Secondary | ICD-10-CM

## 2024-03-24 DIAGNOSIS — R935 Abnormal findings on diagnostic imaging of other abdominal regions, including retroperitoneum: Secondary | ICD-10-CM | POA: Diagnosis not present

## 2024-03-24 DIAGNOSIS — E039 Hypothyroidism, unspecified: Secondary | ICD-10-CM | POA: Diagnosis present

## 2024-03-24 DIAGNOSIS — K838 Other specified diseases of biliary tract: Secondary | ICD-10-CM | POA: Diagnosis not present

## 2024-03-24 DIAGNOSIS — E0590 Thyrotoxicosis, unspecified without thyrotoxic crisis or storm: Secondary | ICD-10-CM | POA: Diagnosis present

## 2024-03-24 DIAGNOSIS — L299 Pruritus, unspecified: Secondary | ICD-10-CM | POA: Diagnosis present

## 2024-03-24 DIAGNOSIS — K831 Obstruction of bile duct: Secondary | ICD-10-CM | POA: Diagnosis not present

## 2024-03-24 DIAGNOSIS — Z5941 Food insecurity: Secondary | ICD-10-CM

## 2024-03-24 DIAGNOSIS — K859 Acute pancreatitis without necrosis or infection, unspecified: Secondary | ICD-10-CM | POA: Diagnosis not present

## 2024-03-24 DIAGNOSIS — R7989 Other specified abnormal findings of blood chemistry: Secondary | ICD-10-CM | POA: Diagnosis not present

## 2024-03-24 DIAGNOSIS — C801 Malignant (primary) neoplasm, unspecified: Secondary | ICD-10-CM

## 2024-03-24 LAB — CBC
HCT: 32.7 % — ABNORMAL LOW (ref 39.0–52.0)
Hemoglobin: 10.6 g/dL — ABNORMAL LOW (ref 13.0–17.0)
MCH: 19.8 pg — ABNORMAL LOW (ref 26.0–34.0)
MCHC: 32.4 g/dL (ref 30.0–36.0)
MCV: 61.1 fL — ABNORMAL LOW (ref 80.0–100.0)
Platelets: 211 K/uL (ref 150–400)
RBC: 5.35 MIL/uL (ref 4.22–5.81)
RDW: 16.1 % — ABNORMAL HIGH (ref 11.5–15.5)
WBC: 4.4 K/uL (ref 4.0–10.5)
nRBC: 0 % (ref 0.0–0.2)

## 2024-03-24 LAB — BILIRUBIN, FRACTIONATED(TOT/DIR/INDIR)
Bilirubin, Direct: 7.6 mg/dL — ABNORMAL HIGH (ref 0.0–0.2)
Indirect Bilirubin: 3.7 mg/dL — ABNORMAL HIGH (ref 0.3–0.9)
Total Bilirubin: 11.3 mg/dL — ABNORMAL HIGH (ref 0.0–1.2)

## 2024-03-24 LAB — FERRITIN: Ferritin: 1068 ng/mL — ABNORMAL HIGH (ref 24–336)

## 2024-03-24 LAB — CMP (CANCER CENTER ONLY)
ALT: 689 U/L (ref 0–44)
AST: 296 U/L (ref 15–41)
Albumin: 4.2 g/dL (ref 3.5–5.0)
Alkaline Phosphatase: 367 U/L — ABNORMAL HIGH (ref 38–126)
Anion gap: 9 (ref 5–15)
BUN: 13 mg/dL (ref 6–20)
CO2: 27 mmol/L (ref 22–32)
Calcium: 10.2 mg/dL (ref 8.9–10.3)
Chloride: 101 mmol/L (ref 98–111)
Creatinine: 1.19 mg/dL (ref 0.61–1.24)
GFR, Estimated: >60 mL/min (ref >60)
Glucose, Bld: 139 mg/dL — ABNORMAL HIGH (ref 70–99)
Potassium: 3.7 mmol/L (ref 3.5–5.1)
Sodium: 137 mmol/L (ref 135–145)
Total Bilirubin: 11.3 mg/dL (ref 0.0–1.2)
Total Protein: 7.8 g/dL (ref 6.5–8.1)

## 2024-03-24 LAB — CBC WITH DIFFERENTIAL (CANCER CENTER ONLY)
Abs Immature Granulocytes: 0.01 K/uL (ref 0.00–0.07)
Basophils Absolute: 0 K/uL (ref 0.0–0.1)
Basophils Relative: 1 %
Eosinophils Absolute: 0.1 K/uL (ref 0.0–0.5)
Eosinophils Relative: 3 %
HCT: 32.4 % — ABNORMAL LOW (ref 39.0–52.0)
Hemoglobin: 10.8 g/dL — ABNORMAL LOW (ref 13.0–17.0)
Immature Granulocytes: 0 %
Lymphocytes Relative: 12 %
Lymphs Abs: 0.6 K/uL — ABNORMAL LOW (ref 0.7–4.0)
MCH: 20.3 pg — ABNORMAL LOW (ref 26.0–34.0)
MCHC: 33.3 g/dL (ref 30.0–36.0)
MCV: 60.9 fL — ABNORMAL LOW (ref 80.0–100.0)
Monocytes Absolute: 0.5 K/uL (ref 0.1–1.0)
Monocytes Relative: 10 %
Neutro Abs: 3.6 K/uL (ref 1.7–7.7)
Neutrophils Relative %: 74 %
Platelet Count: 216 K/uL (ref 150–400)
RBC: 5.32 MIL/uL (ref 4.22–5.81)
RDW: 17.2 % — ABNORMAL HIGH (ref 11.5–15.5)
WBC Count: 4.9 K/uL (ref 4.0–10.5)
nRBC: 0 % (ref 0.0–0.2)

## 2024-03-24 LAB — CREATININE, SERUM
Creatinine, Ser: 1.01 mg/dL (ref 0.61–1.24)
GFR, Estimated: >60 mL/min (ref >60)

## 2024-03-24 LAB — PROTIME-INR
INR: 1 (ref 0.8–1.2)
Prothrombin Time: 13.9 s (ref 11.4–15.2)

## 2024-03-24 LAB — HEMOGLOBIN A1C
Hgb A1c MFr Bld: 6.1 % — ABNORMAL HIGH (ref 4.8–5.6)
Mean Plasma Glucose: 128.37 mg/dL

## 2024-03-24 MED ORDER — HYDROMORPHONE HCL 2 MG PO TABS
1.0000 mg | ORAL_TABLET | Freq: Four times a day (QID) | ORAL | Status: DC | PRN
  Administered 2024-03-24 – 2024-03-25 (×2): 1 mg via ORAL
  Filled 2024-03-24 (×3): qty 1

## 2024-03-24 MED ORDER — ACETAMINOPHEN 650 MG RE SUPP: 650.0000 mg | Freq: Four times a day (QID) | RECTAL | Status: DC | PRN

## 2024-03-24 MED ORDER — PANTOPRAZOLE SODIUM 40 MG PO TBEC
40.0000 mg | DELAYED_RELEASE_TABLET | Freq: Every day | ORAL | Status: DC
  Administered 2024-03-25 – 2024-03-26 (×2): 40 mg via ORAL
  Filled 2024-03-24 (×2): qty 1

## 2024-03-24 MED ORDER — LEVOTHYROXINE SODIUM 125 MCG PO TABS
125.0000 ug | ORAL_TABLET | Freq: Every day | ORAL | Status: DC
  Administered 2024-03-25: 125 ug via ORAL
  Filled 2024-03-24: qty 1

## 2024-03-24 MED ORDER — SODIUM CHLORIDE 0.9% FLUSH: 10.0000 mL | INTRAVENOUS | Status: DC | PRN

## 2024-03-24 MED ORDER — HYDROMORPHONE HCL 1 MG/ML IJ SOLN: 1.0000 mg | INTRAMUSCULAR | Status: DC | PRN

## 2024-03-24 MED ORDER — AMLODIPINE BESYLATE 10 MG PO TABS
10.0000 mg | ORAL_TABLET | Freq: Every day | ORAL | Status: DC
  Administered 2024-03-25 – 2024-03-26 (×2): 10 mg via ORAL
  Filled 2024-03-24 (×2): qty 1

## 2024-03-24 MED ORDER — ONDANSETRON HCL 4 MG PO TABS: 4.0000 mg | ORAL_TABLET | Freq: Four times a day (QID) | ORAL | Status: DC | PRN

## 2024-03-24 MED ORDER — CHLORHEXIDINE GLUCONATE CLOTH 2 % EX PADS
6.0000 | MEDICATED_PAD | Freq: Every day | CUTANEOUS | Status: DC
  Administered 2024-03-24 – 2024-03-26 (×3): 6 via TOPICAL

## 2024-03-24 MED ORDER — AMLODIPINE BESYLATE 10 MG PO TABS: 10.0000 mg | ORAL_TABLET | Freq: Every day | ORAL | Status: DC

## 2024-03-24 MED ORDER — IOHEXOL 300 MG/ML  SOLN
100.0000 mL | Freq: Once | INTRAMUSCULAR | Status: AC | PRN
  Administered 2024-03-24: 100 mL via INTRAVENOUS

## 2024-03-24 MED ORDER — SODIUM CHLORIDE 0.9% FLUSH
10.0000 mL | INTRAVENOUS | Status: DC | PRN
  Administered 2024-03-25 – 2024-03-26 (×2): 10 mL

## 2024-03-24 MED ORDER — SODIUM CHLORIDE 0.9% FLUSH
10.0000 mL | Freq: Two times a day (BID) | INTRAVENOUS | Status: DC
  Administered 2024-03-25 – 2024-03-26 (×2): 10 mL

## 2024-03-24 MED ORDER — HEPARIN SODIUM (PORCINE) 5000 UNIT/ML IJ SOLN
5000.0000 [IU] | Freq: Three times a day (TID) | INTRAMUSCULAR | Status: DC
  Administered 2024-03-24 – 2024-03-25 (×3): 5000 [IU] via SUBCUTANEOUS
  Filled 2024-03-24 (×3): qty 1

## 2024-03-24 MED ORDER — HYDRALAZINE HCL 20 MG/ML IJ SOLN: 10.0000 mg | Freq: Four times a day (QID) | INTRAMUSCULAR | Status: DC | PRN

## 2024-03-24 MED ORDER — ONDANSETRON HCL 4 MG/2ML IJ SOLN
4.0000 mg | Freq: Four times a day (QID) | INTRAMUSCULAR | Status: DC | PRN
  Administered 2024-03-25: 4 mg via INTRAVENOUS

## 2024-03-24 MED ORDER — ACETAMINOPHEN 325 MG PO TABS: 650.0000 mg | ORAL_TABLET | Freq: Four times a day (QID) | ORAL | Status: DC | PRN

## 2024-03-24 NOTE — Consult Note (Cosign Needed)
 Referring Provider: Dr. Cloretta Primary Care Physician:  Medicine, [facility] Primary Gastroenterologist:  Dr. Eda previously  Reason for Consultation:  CBD obstruction  HPI: Douglas Harper is a 53 y.o. male with a new diagnosis of invasive moderately differentiated pancreatic adenocarcinoma with liver metastatic disease.  Was supposed start chemotherapy today, but LFTs found to be significantly elevated.  LFTs elevated most notably for total bilirubin 11.3 up from 3.4 just 10 days ago.  Alk phos 267, ALT 689, AST 296.  CT scan abdomen and pelvis with contrast 03/24/2024:  IMPRESSION: 1. The pancreatic head/uncinate process primary and liver metastasis are relatively similar. 2. Increase in mild to moderate biliary duct dilatation. 3. Incidental findings, including: Left nephrolithiasis. Cholelithiasis.   He was admitted from the oncology office for expedited treatment/evaluation.  Patient says that otherwise he feels fine.  He has noticed that his eyes have turned more yellow and his urine has become darker recently, but no abdominal pain, nausea vomiting, etc.  Moving his bowels well.  Per Dr. Andriette note:  He underwent biopsy of the liver lesion 03/11/2024. The biopsy confirmed adenocarcinoma, but no hepatic tissue was noted. We reviewed the pathology at the GI tumor conference. It appears the pancreas was biopsied. We will request an ultrasound-guided biopsy of the liver lesion during this hospital admission.  Past Medical History:  Diagnosis Date   COVID-19    03/17/20   FH: heart disease    GERD (gastroesophageal reflux disease)    Glaucoma    Hypertension    Hyperthyroidism    s/p RAI 2009    Hypothyroidism    Verruca vulgaris     Past Surgical History:  Procedure Laterality Date   ANTERIOR CRUCIATE LIGAMENT REPAIR Right 2000   HAND SURGERY Right 2001   with hardware implanted   IR IMAGING GUIDED PORT INSERTION  03/17/2024   NO PAST  SURGERIES      Prior to Admission medications   Medication Sig Start Date End Date Taking? Authorizing Provider  amLODipine  (NORVASC ) 10 MG tablet Take 1 tablet (10 mg total) by mouth daily. 03/13/24   Darci Pore, MD  HYDROmorphone  (DILAUDID ) 2 MG tablet Take 0.5 tablets (1 mg total) by mouth every 6 (six) hours as needed for severe pain (pain score 7-10). 03/18/24   Debby Olam POUR, NP  levothyroxine  (SYNTHROID ) 125 MCG tablet TAKE 1 TABLET BY MOUTH 30 MINUTES BEFORE BREAKFAST (STOP DOSE) 12/14/23 12/13/24  Mercer Clotilda SAUNDERS, MD  levothyroxine  (SYNTHROID ) 125 MCG tablet Take 1 tablet (125 mcg total) by mouth every morning 30 minutes before breakfast (stop 112 mcg dose) 12/17/23     lidocaine -prilocaine (EMLA) cream Apply 1 Application topically as needed. Apply 1 Application topically as needed (Apply 1 hours prior to appt) Patient not taking: Reported on 03/24/2024 03/20/24   Cloretta Arley NOVAK, MD  omeprazole (PRILOSEC) 40 MG capsule Take 40 mg by mouth daily. 02/29/24   [provider]  ondansetron  (ZOFRAN ) 4 MG tablet Take 1 tablet (4 mg total) by mouth every 8 (eight) hours as needed for nausea or vomiting. Patient not taking: Reported on 03/24/2024 03/20/24   Cloretta Arley NOVAK, MD  prochlorperazine (COMPAZINE) 10 MG tablet Take 1 tablet (10 mg total) by mouth every 6 (six) hours as needed for nausea or vomiting. Patient not taking: Reported on 03/24/2024 03/21/24   Cloretta Arley NOVAK, MD    Current Facility-Administered Medications  Medication Dose Route Frequency Provider Last Rate Last Admin  Chlorhexidine Gluconate Cloth 2 % PADS 6 each  6 each Topical Daily Cheryle Page, MD   6 each at 03/24/24 1559   sodium chloride  flush (NS) 0.9 % injection 10-40 mL  10-40 mL Intracatheter Q12H Alekh, Kshitiz, MD       sodium chloride  flush (NS) 0.9 % injection 10-40 mL  10-40 mL Intracatheter PRN Cheryle Page, MD        Allergies as of 03/24/2024   (No Known Allergies)     Family History  Problem Relation Age of Onset   Heart disease Mother    Hypertension Mother    Diabetes Mother    Colon cancer Neg Hx    Esophageal cancer Neg Hx    Pancreatic cancer Neg Hx    Liver disease Neg Hx    Rectal cancer Neg Hx    Stomach cancer Neg Hx     Social History   Socioeconomic History   Marital status: Single    Spouse name: Not on file   Number of children: Not on file   Years of education: Not on file   Highest education level: Some college, no degree  Occupational History   Not on file  Tobacco Use   Smoking status: Never   Smokeless tobacco: Never  Substance and Sexual Activity   Alcohol use: Yes    Comment: socially   Drug use: Never   Sexual activity: Not on file  Other Topics Concern   Not on file  Social History Narrative   Married wife [name]    From [place] been here since 53 y.o    Works Product/process development scientist [employer]    No tobacco, drugs, occas. etoh    8 siblings   Social Drivers of Corporate investment banker Strain: Medium Risk (04/25/2022)   Overall Financial Resource Strain (CARDIA)    Difficulty of Paying Living Expenses: Somewhat hard  Food Insecurity: Food Insecurity Present (03/17/2024)   Hunger Vital Sign    Worried About Running Out of Food in the Last Year: Sometimes true    Ran Out of Food in the Last Year: Sometimes true  Transportation Needs: No Transportation Needs (03/14/2024)   PRAPARE - Administrator, Civil Service (Medical): No    Lack of Transportation (Non-Medical): No  Physical Activity: Sufficiently Active (04/25/2022)   Exercise Vital Sign    Days of Exercise per Week: 6 days    Minutes of Exercise per Session: 30 min  Stress: No Stress Concern Present (04/25/2022)   [employer] of Occupational Health - Occupational Stress Questionnaire    Feeling of Stress : Not at all  Social Connections: Moderately Isolated (04/25/2022)   Social Connection and Isolation Panel     Frequency of Communication with Friends and Family: More than three times a week    Frequency of Social Gatherings with Friends and Family: More than three times a week    Attends Religious Services: Never    Database administrator or Organizations: No    Attends Engineer, structural: Not on file    Marital Status: Living with partner  Intimate Partner Violence: Not At Risk (03/14/2024)   Humiliation, Afraid, Rape, and Kick questionnaire    Fear of Current or Ex-Partner: No    Emotionally Abused: No    Physically Abused: No    Sexually Abused: No    Review of Systems: ROS is O/W negative except as mentioned in HPI.  Physical Exam:  Vital signs in last 24 hours: Temp:  [98.2 F (36.8 C)-98.3 F (36.8 C)] 98.2 F (36.8 C) (10/06 1544) Pulse Rate:  [66-79] 66 (10/06 1544) Resp:  [18] 18 (10/06 1544) BP: (125-139)/(87-91) 139/91 (10/06 1544) SpO2:  [100 %] 100 % (10/06 1544) Weight:  [91.8 kg] 91.8 kg (10/06 0901) Last BM Date : 03/23/24 General:  Alert, Well-developed, well-nourished, pleasant and cooperative in NAD Head:  Normocephalic and atraumatic. Eyes:  Scleral icterus noted. Ears:  Normal auditory acuity. Mouth:  No deformity or lesions.   Lungs:  Clear throughout to auscultation.  No wheezes, crackles, or rhonchi.  Heart:  Regular rate and rhythm; no murmurs, clicks, rubs, or gallops. Abdomen:  Soft, non-distended.  BS present.  Non-tender.  Msk:  Symmetrical without gross deformities.  Pulses:  Normal pulses noted. Extremities:  Without clubbing or edema. Neurologic:  Alert and oriented x 4;  grossly normal neurologically. Skin:  Intact without significant lesions or rashes. Psych:  Alert and cooperative. Normal mood and affect.  Lab Results: Recent Labs    03/24/24 0845  WBC 4.9  HGB 10.8*  HCT 32.4*  PLT 216   BMET Recent Labs    03/24/24 0845  NA 137  K 3.7  CL 101  CO2 27  GLUCOSE 139*  BUN 13  CREATININE 1.19  CALCIUM 10.2    LFT Recent Labs    03/24/24 0845  PROT 7.8  ALBUMIN 4.2  AST 296*  ALT 689*  ALKPHOS 367*  BILITOT 11.3*  11.3*  BILIDIR 7.6*  IBILI 3.7*   Studies/Results: CT ABDOMEN PELVIS W CONTRAST Result Date: 03/24/2024 CLINICAL DATA:  Elevated bilirubin. On chemotherapy for pancreatic carcinoma. * Tracking Code: BO * EXAM: CT ABDOMEN AND PELVIS WITH CONTRAST TECHNIQUE: Multidetector CT imaging of the abdomen and pelvis was performed using the standard protocol following bolus administration of intravenous contrast. RADIATION DOSE REDUCTION: This exam was performed according to the departmental dose-optimization program which includes automated exposure control, adjustment of the mA and/or kV according to patient size and/or use of iterative reconstruction technique. CONTRAST:  OMNIPAQUE  IOHEXOL  300 MG/ML  SOLN COMPARISON:  03/09/2024 FINDINGS: Lower chest: Clear lung bases. Normal heart size without pericardial or pleural effusion. Hepatobiliary: Bilobar hepatic metastasis. Index segment 3 mass measures 3.8 x 3.3 cm in 24/2 versus 3.2 x 3.4 cm when measured in a similar fashion on the prior. Index subcapsular right hepatic lobe lesion measures 2.0 cm on image 31/2 versus 2.3 cm when remeasured in a similar fashion on the prior. Index subcapsular right hepatic lobe 1.6 cm lesion on 20/2 measured 1.4 cm on the prior. Intrahepatic biliary duct dilatation is mild and minimally increased. Common duct measures 12 mm on coronal image 58 versus 9 mm on the prior exam when remeasured in similar fashion. Common duct is obstructed by the pancreatic head mass detailed below. Subtle stone or stones in the gallbladder fundus. Pancreas: Pancreatic head/uncinate process mass measures 2.7 x 3.0 cm on 31/2 and is felt to be similar to on the prior (when remeasured). The upstream pancreatic duct dilatation and atrophy are moderate and similar. No evidence of superimposed pancreatitis. Spleen: Normal in size, without  focal abnormality. Adrenals/Urinary Tract: Normal adrenal glands. Left renal collecting system calculi up to 1.0 cm Low-density left renal lesions up to 1.5 cm are likely cysts and do not warrant imaging follow-up. No hydronephrosis.  Normal urinary bladder. Stomach/Bowel: Normal stomach, without wall thickening. Normal colon and terminal ileum. Normal small bowel. Vascular/Lymphatic: Aortic  atherosclerosis. Splenoportal confluence and SMV narrowing by tumor. No arterial encasement. No abdominopelvic adenopathy. Reproductive: Normal prostate. Other: No significant free fluid.  No free intraperitoneal air. Musculoskeletal: Lumbar spondylosis. IMPRESSION: 1. The pancreatic head/uncinate process primary and liver metastasis are relatively similar. 2. Increase in mild to moderate biliary duct dilatation. 3. Incidental findings, including: Left nephrolithiasis. Cholelithiasis. Case discussed with Dr. Cloretta  at 1:10 p.m. Electronically Signed   By: Rockey Kilts M.D.   On: 03/24/2024 13:22    IMPRESSION:  53 year old male with new diagnosis of pancreatic adenocarcinoma with metastatic disease to the liver.  Was supposed start chemotherapy today, but LFTs found to be further elevated from previous with a total bili of 11.  CT scan showed increase in mild to moderate biliary ductal dilatation.  Common duct is obstructed by the pancreatic head mass.  PLAN: - Will plan for ERCP with stent placement with Dr. Abran on 10/7 tentatively. - Per Dr. Andriette note, he will also need repeat liver biopsy of one of the hepatic lesions.  Will leave oncology to place that order if needed.  Looks like there is already an order in place, but I am not sure if that is an outpatient order. - Will allow him to have regular diet tonight and n.p.o. after midnight. - Will check PT/INR.  Trend other labs.   Harlene BIRCH. Braylynn Lewing  03/24/2024, 4:03 PM  GI ATTENDING  History, laboratories, x-rays, prior endoscopy reports were all  personally reviewed.  Patient seen and examined.  Agree with comprehensive consultation note as outlined above.  Unfortunate gentleman with metastatic pancreatic cancer.  Now presents with obstructive jaundice, presumably secondary to cancer, as noted on imaging.  We will plan on ERCP with hopes of providing biliary drainage via stenting.The nature of the procedure, as well as the risks, benefits, and alternatives were carefully and thoroughly reviewed with the patient. Ample time for discussion and questions allowed. The patient understood, was satisfied, and agreed to proceed.  Total time 75 minutes was required for all of the aspects of the above H&P as outlined as well as medical decision making which has been deemed highly complex.  As well, the patient is high risk due to his comorbidities.  Norleen SAILOR. Abran Raddle., M.D. [facility] Division of Gastroenterology

## 2024-03-24 NOTE — Progress Notes (Signed)
 53 year old male diagnosed with Pancreas cancer and followed by Dr. Cloretta.  Plan for Modified FOLFIRINOX q14d x 4 cycles. Treatment held today.  PMH includes COVID 19, GERD, HTN, and Thyroid  disease.  Medications include Synthroid , Prilosec, Zofran , and Compazine.  Labs include Glucose 139.  Height: 6'4. Weight: 202 pounds 6.4 oz Oct 6. UBW: ~210 pounds. BMI: 24.64.  Reports eating well at this time. He intentionally lost weight in 2024 and settled in at about 207 pounds. Reports unintentional weight loss recently due to changing his diet and eliminating sweets and junk foods. Since discussion with Nurse Navigator, patient increasing oral intake. Focuses on soft foods in small amounts because otherwise, he feels like it is getting stuck. Drinks Dispensing optician. Denies other nutrition impact symptoms.  Nutrition Diagnosis: Food and Nutrition Related Knowledge Deficit related to pancreas cancer and associated treatment as evidenced by no prior need for nutrition related information.  Intervention: Educated on importance of small, frequent meals and snacks. Focus on high calorie and high protein foods for weight maintenance. Add ONS if desired/needed to support oral intake. Provided samples and coupons. Reviewed cold sensitivity with relation to food choices after oxaliplatin. Provided nutrition fact sheets. Questions answered and contact information given.  Monitoring, Evaluation, Goals: Tolerate adequate calories and protein to minimize weight loss.  Next Visit: To be scheduled as needed with upcoming treatments.

## 2024-03-24 NOTE — H&P (Signed)
 History and Physical    Patient: Douglas Harper FMW:[id number] DOB: 09-08-1970 DOA: 03/24/2024 DOS: the patient was seen and examined on 03/24/2024 PCP: Medicine, [facility]   Patient coming from: Home  Chief Complaint: No chief complaint on file.  HPI: Douglas Harper is a 53 y.o. male with medical history significant of hypothyroidism, hypertension, gastroesophageal flux disease and pancreatic cancer; who presented to the hospital secondary to elevated LFT's.   Patient was seen at cancer center for initiation of chemotherapy and despite being currently asymptomatic his blood work was concerning for elevated bilirubin and worsening LFT's, CT demonstrated mild to moderate biliary duct dilatation.  Case was discussed with gastroenterology service and the plan is for ERCP/stenting 03/25/2024.  Patient bilirubin 11.3 (was 3.4 approximately 10 days ago); LFTs demonstrating alk phos 267, ALT 639 and AST 296.  Review of Systems: As mentioned in the history of present illness. All other systems reviewed and are negative. Past Medical History:  Diagnosis Date   COVID-19    03/17/20   FH: heart disease    GERD (gastroesophageal reflux disease)    Glaucoma    Hypertension    Hyperthyroidism    s/p RAI 2009    Hypothyroidism    Verruca vulgaris    Past Surgical History:  Procedure Laterality Date   ANTERIOR CRUCIATE LIGAMENT REPAIR Right 2000   HAND SURGERY Right 2001   with hardware implanted   IR IMAGING GUIDED PORT INSERTION  03/17/2024   NO PAST SURGERIES     Social History:  reports that he has never smoked. He has never used smokeless tobacco. He reports current alcohol use. He reports that he does not use drugs.  No Known Allergies  Family History  Problem Relation Age of Onset   Heart disease Mother    Hypertension Mother    Diabetes Mother    Colon cancer Neg Hx    Esophageal cancer Neg Hx    Pancreatic cancer Neg Hx    Liver disease Neg Hx    Rectal cancer  Neg Hx    Stomach cancer Neg Hx     Prior to Admission medications   Medication Sig Start Date End Date Taking? Authorizing Provider  amLODipine  (NORVASC ) 10 MG tablet Take 1 tablet (10 mg total) by mouth daily. 03/13/24   Darci Pore, MD  HYDROmorphone  (DILAUDID ) 2 MG tablet Take 0.5 tablets (1 mg total) by mouth every 6 (six) hours as needed for severe pain (pain score 7-10). 03/18/24   Debby Olam POUR, NP  levothyroxine  (SYNTHROID ) 125 MCG tablet TAKE 1 TABLET BY MOUTH 30 MINUTES BEFORE BREAKFAST (STOP DOSE) 12/14/23 12/13/24  Mercer Clotilda SAUNDERS, MD  lidocaine -prilocaine (EMLA) cream Apply 1 Application topically as needed. Apply 1 Application topically as needed (Apply 1 hours prior to appt) Patient not taking: Reported on 03/24/2024 03/20/24   Cloretta Arley NOVAK, MD  omeprazole (PRILOSEC) 40 MG capsule Take 40 mg by mouth daily. 02/29/24   [provider]  ondansetron  (ZOFRAN ) 4 MG tablet Take 1 tablet (4 mg total) by mouth every 8 (eight) hours as needed for nausea or vomiting. Patient not taking: Reported on 03/24/2024 03/20/24   Cloretta Arley NOVAK, MD  prochlorperazine (COMPAZINE) 10 MG tablet Take 1 tablet (10 mg total) by mouth every 6 (six) hours as needed for nausea or vomiting. Patient not taking: Reported on 03/24/2024 03/21/24   Cloretta Arley NOVAK, MD    Physical Exam: Vitals:   03/24/24 1544 03/24/24 1714  BP: (!) 139/91 (!) 152/91  Pulse: 66 73  Resp: 18 18  Temp: 98.2 F (36.8 C) 97.9 F (36.6 C)  TempSrc: Oral Oral  SpO2: 100% 99%  Weight: 91.8 kg   Height: 6' 4 (1.93 m)    General exam: Alert, awake, oriented x 3 Respiratory system: Clear to auscultation. Respiratory effort normal. Cardiovascular system:RRR. No murmurs, rubs, gallops. Gastrointestinal system: Abdomen is nondistended, soft and nontender. No organomegaly or masses felt. Normal bowel sounds heard. Central nervous system: Alert and oriented. No focal neurological deficits. Extremities: No  C/C/E, +pedal pulses Skin: No rashes, lesions or ulcers Psychiatry: Judgement and insight appear normal. Mood & affect appropriate.   Data Reviewed: Comprehensive metabolic panel: Sodium 137, potassium 3.7, chloride 101, bicarb 27, glucose 139, BUN 13, creatinine 1.18, calcium 10.2, albumin 4.2, AST 296, ALT 689, alk phos 367, total bilirubin 11.3 and GFR >60 CBC: White blood cells 4.9, hemoglobin 10.8, platelet count 216K Fractionated bilirubin: Direct bilirubin 7.6 and indirect bilirubin 3.7 Ferritin: 1068  Assessment and Plan: 1-elevated LFTs/hyperbilirubinemia/pancreatic cancer - With high concern for biliary obstruction - Case discussed with GI service will plan for ERCP and stenting on 03/25/2024 - Continue to follow GI and oncology service rec's -there is also plans for liver biopsy as per oncology rec's.  2-hypertension - Resume home antihypertensive agents - As needed hydralazine  will be provided - Follow vital signs.  3-hypothyroidism - Continue Synthroid   4-GERD - Continue PPI  5-hyperglycemia - No prior history of diabetes - Will check A1c - Follow CBG fluctuation.    Advance Care Planning:   Code Status: Full Code   Consults: Gastroenterology service and oncology service.  Family Communication: No family at bedside.  Severity of Illness: The appropriate patient status for this patient is INPATIENT. Inpatient status is judged to be reasonable and necessary in order to provide the required intensity of service to ensure the patient's safety. The patient's presenting symptoms, physical exam findings, and initial radiographic and laboratory data in the context of their chronic comorbidities is felt to place them at high risk for further clinical deterioration. Furthermore, it is not anticipated that the patient will be medically stable for discharge from the hospital within 2 midnights of admission.   * I certify that at the point of admission it is my clinical  judgment that the patient will require inpatient hospital care spanning beyond 2 midnights from the point of admission due to high intensity of service, high risk for further deterioration and high frequency of surveillance required.*  Author: Eric Nunnery, MD 03/24/2024 6:20 PM  For on call review [email or web address].

## 2024-03-24 NOTE — Patient Instructions (Signed)

## 2024-03-24 NOTE — Progress Notes (Signed)
  [facility] OFFICE PROGRESS NOTE   Diagnosis: Pancreas cancer  INTERVAL HISTORY:   Douglas Harper returns as scheduled.  He is seen prior to proceeding with cycle 1 FOLFIRINOX.  He denies nausea/vomiting.  No diarrhea.  Bowels moving regularly overall.  He denies any bleeding.  He has noted generalized pruritus since the Port-A-Cath was placed.  He continues to note dysphagia.  Objective:  Vital signs in last 24 hours:  Blood pressure 125/87, pulse 79, temperature 98.3 F (36.8 C), temperature source Temporal, resp. rate 18, height 6' 4 (1.93 m), weight 202 lb 6.4 oz (91.8 kg), SpO2 100%.    HEENT: Scleral icterus.  No thrush or ulcers. Resp: Lungs clear bilaterally. Cardio: Regular rate and rhythm. GI: No hepatosplenomegaly.  Nontender. Vascular: No leg edema. Port-A-Cath without erythema.  Lab Results:  Lab Results  Component Value Date   WBC 5.4 03/14/2024   HGB 12.4 (L) 03/14/2024   HCT 39.1 03/14/2024   MCV 63.0 (L) 03/14/2024   PLT 195 03/14/2024   NEUTROABS 4.0 03/14/2024    Imaging:  No results found.  Medications: I have reviewed the patient's current medications.  Assessment/Plan: Pancreas cancer, stage IV (cT1c, cN0, pM1) 03/09/2024 CTs chest, abdomen, and pelvis: 1.6 x 1.9 cm pancreas head mass, upstream pancreatic duct dilation, multiple hypoattenuating liver lesions, fat stranding surrounding the pancreas head and proximal duodenum, no metastatic disease in the chest Ultrasound-guided biopsy of left liver mass 03/11/2024: Invasive moderately differentiated pancreatic adenocarcinoma, pancreas with severe ductal dysplasia (PanIN-3), no hepatic parenchyma is present Elevated CA 19-9 Elevated liver enzymes and bilirubin Progressive rise in liver enzymes and bilirubin 03/24/2024 CTs 03/24/2024-increase in mild to moderate biliary duct dilatation; pancreatic head/uncinate process mass and liver metastasis relatively similar Pain secondary to  #1 Hypothyroidism Family history of colon cancer Red cell microcytosis Hyperthyroidism status post RAI in 2009 Gastroesophageal reflux disease Glaucoma Hypertension  Disposition: Douglas Harper appears stable.  He was scheduled to begin treatment today with cycle 1 FOLFIRINOX.  There has been a progressive rise in the liver enzymes and bilirubin.  He may have biliary obstruction.  Chemotherapy on hold pending further evaluation, plan for urgent CT scans today.  Patient seen with Dr. Cloretta.   Olam Ned ANP/GNP-BC   03/24/2024  9:31 AM  Addendum 03/24/2024 1:43 PM-CT with increased biliary duct dilatation.  Plan for hospital admission for stent placement.  He will return for follow-up and cycle 1 FOLFIRINOX in 2 weeks.  We are available to see him sooner if needed.  LT   This was a shared visit with Olam Ned.  Douglas Harper was interviewed and examined.  He presented today to begin systemic therapy for treatment of metastatic pancreas cancer.  The bilirubin and liver enzymes are more elevated.  He had an urgent CT abdomen/pelvis.  This confirmed biliary obstruction.  I reviewed the CT images and discussed with a radiologist.  I contacted the gastroenterology service.  Douglas Harper will be admitted to [facility] with the plan to proceed with an ERCP/bile duct stent placement.  He underwent biopsy of the liver lesion 03/11/2024.  The biopsy confirmed adenocarcinoma, but no hepatic tissue was noted.  We reviewed the pathology at the GI tumor conference.  It appears the pancreas was biopsied.  We will request an ultrasound-guided biopsy of the liver lesion during this hospital admission.  I discussed the biliary obstruction and treatment plan with Douglas Harper and his family.  Arvella Cloretta, MD

## 2024-03-24 NOTE — Progress Notes (Signed)
 CRITICAL VALUE STICKER  CRITICAL VALUE: AST 296, ALT 689, Total bili 11.3  RECEIVER (on-site recipient of call):Madora Barletta,RN  DATE & TIME NOTIFIED: 03/24/2024 @ 0956  MESSENGER (representative from lab):Thersia  MD NOTIFIED: Olam Ned, NP  TIME OF NOTIFICATION:0957  RESPONSE: Seeing patient now

## 2024-03-24 NOTE — Plan of Care (Signed)

## 2024-03-25 ENCOUNTER — Inpatient Hospital Stay (HOSPITAL_COMMUNITY)

## 2024-03-25 ENCOUNTER — Encounter (HOSPITAL_COMMUNITY): Payer: Self-pay | Admitting: Internal Medicine

## 2024-03-25 ENCOUNTER — Other Ambulatory Visit: Payer: Self-pay

## 2024-03-25 ENCOUNTER — Encounter (HOSPITAL_COMMUNITY)
Admission: AD | Disposition: A | Discharge status: Home / Self Care | Payer: Self-pay | Source: Home / Self Care | Attending: Internal Medicine

## 2024-03-25 ENCOUNTER — Inpatient Hospital Stay (HOSPITAL_COMMUNITY): Admitting: Anesthesiology

## 2024-03-25 ENCOUNTER — Other Ambulatory Visit (HOSPITAL_BASED_OUTPATIENT_CLINIC_OR_DEPARTMENT_OTHER): Payer: Self-pay

## 2024-03-25 ENCOUNTER — Encounter (HOSPITAL_COMMUNITY): Payer: Self-pay

## 2024-03-25 DIAGNOSIS — E039 Hypothyroidism, unspecified: Secondary | ICD-10-CM | POA: Diagnosis not present

## 2024-03-25 DIAGNOSIS — C250 Malignant neoplasm of head of pancreas: Secondary | ICD-10-CM | POA: Diagnosis not present

## 2024-03-25 DIAGNOSIS — I10 Essential (primary) hypertension: Secondary | ICD-10-CM

## 2024-03-25 DIAGNOSIS — R17 Unspecified jaundice: Secondary | ICD-10-CM

## 2024-03-25 DIAGNOSIS — K831 Obstruction of bile duct: Secondary | ICD-10-CM

## 2024-03-25 DIAGNOSIS — K838 Other specified diseases of biliary tract: Secondary | ICD-10-CM

## 2024-03-25 DIAGNOSIS — C801 Malignant (primary) neoplasm, unspecified: Secondary | ICD-10-CM

## 2024-03-25 DIAGNOSIS — R935 Abnormal findings on diagnostic imaging of other abdominal regions, including retroperitoneum: Secondary | ICD-10-CM

## 2024-03-25 HISTORY — PX: ERCP: SHX5425

## 2024-03-25 LAB — COMPREHENSIVE METABOLIC PANEL WITH GFR
ALT: 623 U/L — ABNORMAL HIGH (ref 0–44)
AST: 246 U/L — ABNORMAL HIGH (ref 15–41)
Albumin: 3.9 g/dL (ref 3.5–5.0)
Alkaline Phosphatase: 319 U/L — ABNORMAL HIGH (ref 38–126)
Anion gap: 11 (ref 5–15)
BUN: 14 mg/dL (ref 6–20)
CO2: 25 mmol/L (ref 22–32)
Calcium: 9.4 mg/dL (ref 8.9–10.3)
Chloride: 104 mmol/L (ref 98–111)
Creatinine, Ser: 1.05 mg/dL (ref 0.61–1.24)
GFR, Estimated: >60 mL/min (ref >60)
Glucose, Bld: 102 mg/dL — ABNORMAL HIGH (ref 70–99)
Potassium: 3.9 mmol/L (ref 3.5–5.1)
Sodium: 139 mmol/L (ref 135–145)
Total Bilirubin: 11.3 mg/dL — ABNORMAL HIGH (ref 0.0–1.2)
Total Protein: 6.9 g/dL (ref 6.5–8.1)

## 2024-03-25 LAB — GLUCOSE, CAPILLARY: Glucose-Capillary: 268 mg/dL — ABNORMAL HIGH (ref 70–99)

## 2024-03-25 LAB — CBC
HCT: 32.6 % — ABNORMAL LOW (ref 39.0–52.0)
Hemoglobin: 10.3 g/dL — ABNORMAL LOW (ref 13.0–17.0)
MCH: 19.3 pg — ABNORMAL LOW (ref 26.0–34.0)
MCHC: 31.6 g/dL (ref 30.0–36.0)
MCV: 61 fL — ABNORMAL LOW (ref 80.0–100.0)
Platelets: 209 K/uL (ref 150–400)
RBC: 5.34 MIL/uL (ref 4.22–5.81)
RDW: 16 % — ABNORMAL HIGH (ref 11.5–15.5)
WBC: 4.8 K/uL (ref 4.0–10.5)
nRBC: 0 % (ref 0.0–0.2)

## 2024-03-25 SURGERY — ERCP, WITH INTERVENTION IF INDICATED
Anesthesia: General

## 2024-03-25 MED ORDER — CIPROFLOXACIN IN D5W 400 MG/200ML IV SOLN
INTRAVENOUS | Status: AC
  Filled 2024-03-25: qty 200

## 2024-03-25 MED ORDER — FENTANYL CITRATE (PF) 100 MCG/2ML IJ SOLN
INTRAMUSCULAR | Status: DC | PRN
  Administered 2024-03-25 (×4): 50 ug via INTRAVENOUS

## 2024-03-25 MED ORDER — PROPOFOL 10 MG/ML IV BOLUS
INTRAVENOUS | Status: DC | PRN
Start: 2024-03-25 — End: 2024-03-25
  Administered 2024-03-25: 180 mg via INTRAVENOUS

## 2024-03-25 MED ORDER — INDOMETHACIN 50 MG RE SUPP
RECTAL | Status: DC | PRN
Start: 2024-03-25 — End: 2024-03-25
  Administered 2024-03-25: 100 mg via RECTAL

## 2024-03-25 MED ORDER — PHENYLEPHRINE 80 MCG/ML (10ML) SYRINGE FOR IV PUSH (FOR BLOOD PRESSURE SUPPORT)
PREFILLED_SYRINGE | INTRAVENOUS | Status: DC | PRN
  Administered 2024-03-25 (×2): 80 ug via INTRAVENOUS

## 2024-03-25 MED ORDER — PROPOFOL 10 MG/ML IV BOLUS
INTRAVENOUS | Status: AC
  Filled 2024-03-25: qty 20

## 2024-03-25 MED ORDER — DICLOFENAC SUPPOSITORY 100 MG
RECTAL | Status: AC
  Filled 2024-03-25: qty 1

## 2024-03-25 MED ORDER — FENTANYL CITRATE (PF) 100 MCG/2ML IJ SOLN
INTRAMUSCULAR | Status: AC
  Filled 2024-03-25: qty 2

## 2024-03-25 MED ORDER — SODIUM CHLORIDE 0.9 % IV SOLN: INTRAVENOUS | Status: DC

## 2024-03-25 MED ORDER — ROCURONIUM BROMIDE 100 MG/10ML IV SOLN
INTRAVENOUS | Status: DC | PRN
  Administered 2024-03-25: 50 mg via INTRAVENOUS

## 2024-03-25 MED ORDER — MIDAZOLAM HCL 5 MG/5ML IJ SOLN
INTRAMUSCULAR | Status: DC | PRN
  Administered 2024-03-25: 2 mg via INTRAVENOUS

## 2024-03-25 MED ORDER — LACTATED RINGERS IV SOLN: Freq: Once | INTRAVENOUS | Status: AC

## 2024-03-25 MED ORDER — PHENYLEPHRINE HCL-NACL 20-0.9 MG/250ML-% IV SOLN
INTRAVENOUS | Status: DC | PRN
  Administered 2024-03-25: 50 ug/min via INTRAVENOUS

## 2024-03-25 MED ORDER — INDOMETHACIN 50 MG RE SUPP
RECTAL | Status: AC
  Filled 2024-03-25: qty 2

## 2024-03-25 MED ORDER — SODIUM CHLORIDE 0.9 % IV SOLN
1.5000 g | Freq: Once | INTRAVENOUS | Status: AC
  Administered 2024-03-25: 1.5 g via INTRAVENOUS
  Filled 2024-03-25: qty 4

## 2024-03-25 MED ORDER — INDOMETHACIN 50 MG RE SUPP: 100.0000 mg | Freq: Once | RECTAL | Status: DC

## 2024-03-25 MED ORDER — DEXAMETHASONE SODIUM PHOSPHATE 10 MG/ML IJ SOLN
INTRAMUSCULAR | Status: DC | PRN
Start: 2024-03-25 — End: 2024-03-25
  Administered 2024-03-25: 10 mg via INTRAVENOUS

## 2024-03-25 MED ORDER — SODIUM CHLORIDE 0.9 % IV SOLN
INTRAVENOUS | Status: DC | PRN
  Administered 2024-03-25: 90 mL

## 2024-03-25 MED ORDER — MIDAZOLAM HCL 2 MG/2ML IJ SOLN
INTRAMUSCULAR | Status: AC
  Filled 2024-03-25: qty 2

## 2024-03-25 MED ORDER — LIDOCAINE 2% (20 MG/ML) 5 ML SYRINGE
INTRAMUSCULAR | Status: DC | PRN
  Administered 2024-03-25: 100 mg via INTRAVENOUS

## 2024-03-25 MED ORDER — SUGAMMADEX SODIUM 200 MG/2ML IV SOLN
INTRAVENOUS | Status: DC | PRN
  Administered 2024-03-25: 200 mg via INTRAVENOUS

## 2024-03-25 MED ORDER — LACTATED RINGERS IV SOLN: INTRAVENOUS | Status: DC | PRN

## 2024-03-25 MED ORDER — GLUCAGON HCL RDNA (DIAGNOSTIC) 1 MG IJ SOLR
INTRAMUSCULAR | Status: AC
  Filled 2024-03-25: qty 1

## 2024-03-25 MED ORDER — GLYCOPYRROLATE PF 0.2 MG/ML IJ SOSY
PREFILLED_SYRINGE | INTRAMUSCULAR | Status: DC | PRN
  Administered 2024-03-25: .2 mg via INTRAVENOUS

## 2024-03-25 NOTE — Anesthesia Preprocedure Evaluation (Signed)
 Anesthesia Evaluation  Patient identified by MRN, date of birth, ID band Patient awake    Reviewed: Allergy & Precautions, NPO status , Patient's Chart, lab work & pertinent test results  History of Anesthesia Complications Negative for: history of anesthetic complications  Airway Mallampati: II  TM Distance: >3 FB Neck ROM: Full    Dental no notable dental hx. (+) Teeth Intact   Pulmonary neg pulmonary ROS, neg sleep apnea, neg COPD, Patient abstained from smoking.Not current smoker   Pulmonary exam normal breath sounds clear to auscultation       Cardiovascular Exercise Tolerance: Good METShypertension, (-) CAD and (-) Past MI (-) dysrhythmias  Rhythm:Regular Rate:Normal - Systolic murmurs    Neuro/Psych negative neurological ROS  negative psych ROS   GI/Hepatic ,GERD  ,,(+)     (-) substance abuse  Pancreatic cancer   Endo/Other  neg diabetesHypothyroidism    Renal/GU negative Renal ROS     Musculoskeletal   Abdominal   Peds  Hematology   Anesthesia Other Findings Past Medical History: No date: COVID-19     Comment:  03/17/20 No date: FH: heart disease No date: GERD (gastroesophageal reflux disease) No date: Glaucoma No date: Hypertension No date: Hyperthyroidism     Comment:  s/p RAI 2009  No date: Hypothyroidism No date: Verruca vulgaris  Reproductive/Obstetrics                              Anesthesia Physical Anesthesia Plan  ASA: 3  Anesthesia Plan: General   Post-op Pain Management:    Induction: Intravenous  PONV Risk Score and Plan: 2 and Ondansetron  and Dexamethasone   Airway Management Planned: Oral ETT  Additional Equipment: None  Intra-op Plan:   Post-operative Plan: Extubation in OR  Informed Consent: I have reviewed the patients History and Physical, chart, labs and discussed the procedure including the risks, benefits and alternatives for the  proposed anesthesia with the patient or authorized representative who has indicated his/her understanding and acceptance.     Dental advisory given  Plan Discussed with: CRNA and Surgeon  Anesthesia Plan Comments: (Discussed risks of anesthesia with patient, including PONV, sore throat, lip/dental/eye damage. Rare risks discussed as well, such as cardiorespiratory and neurological sequelae, and allergic reactions. Discussed the role of CRNA in patient's perioperative care. Patient understands.)         Anesthesia Quick Evaluation

## 2024-03-25 NOTE — Progress Notes (Signed)
  Vanice Sharper, MD  Michaelene Setter [facility] for US  liver met core bx  See Ct 03/24/24  TS           _____________________________________________________    Jenna Cordella LABOR, MD  Michaelene Setter PROCEDURE / BIOPSY REVIEW Date: 03/25/24  Requested Biopsy site: liver Reason for request: liver masses, repeat Imaging review: Best seen on CT  Decision: Approved Imaging modality to perform: Ultrasound Schedule with: Moderate Sedation Schedule for: Any VIR  Additional comments: @Schedulers .  Please contact me with questions, concerns, or if issue pertaining to this request arise.  Cordella LABOR Jenna, MD [facility] Radiology       Previous Messages    ----- Message ----- From: Jodelle Fausto Sent: 03/24/2024   2:26 PM EDT To: Zyrell Carmean; Ir Procedure Requests Subject: US  biopsy ( liver )                            Procedure : US  liver biopsy  Reason : pancreas cancer, liver lesions--biopsy liver lesion Dx: Pancreatic adenocarcinoma (HCC) [C25.9 (ICD-10-CM)]    History :  CT abd pelv w/  Provider :Debby Olam POUR, NP  Contact : [phone]

## 2024-03-25 NOTE — Anesthesia Procedure Notes (Signed)
 Procedure Name: Intubation Date/Time: 03/25/2024 2:41 PM  Performed by: Nada Corean CROME, CRNAPre-anesthesia Checklist: Suction available, Emergency Drugs available, Patient identified, Patient being monitored and Timeout performed Patient Re-evaluated:Patient Re-evaluated prior to induction Oxygen Delivery Method: Circle system utilized Preoxygenation: Pre-oxygenation with 100% oxygen Induction Type: IV induction Ventilation: Mask ventilation without difficulty Laryngoscope Size: Mac and 4 Grade View: Grade II Tube type: Oral Tube size: 7.0 mm Number of attempts: 1 Airway Equipment and Method: Stylet Secured at: 23 cm Tube secured with: Tape Dental Injury: Teeth and Oropharynx as per pre-operative assessment

## 2024-03-25 NOTE — Progress Notes (Signed)
 PROGRESS NOTE    Douglas Harper  FMW:[id number] DOB: [birth year] DOA: 03/24/2024 PCP: Medicine, [facility]   Brief Narrative:  Douglas Harper is a 53 y.o. male with medical history significant of hypothyroidism, hypertension, gastroesophageal flux disease and pancreatic cancer; who presented to the hospital secondary to elevated LFT's concerning for obstructive pathology.  Hospitalist called for admission, GI called in consult.  Assessment & Plan:   Principal Problem:   Biliary obstruction (HCC)   Acutely worsening LFTs/hyperbilirubinemia in the setting of known pancreatic cancer Rule out obstructive pathology Chronic abdominal pain - Given imaging GI planning ERCP with stenting later today on 03/25/2024 -Tentative plan for biopsy of the liver during ERCP as well - Will need close follow-up in the outpatient setting with both GI and oncology  -Continue home pain regimen with Dilaudid    Hypertension, essential, well-controlled - Continue home amlodipine    Hypothyroidism, well-controlled - Continue Synthroid    GERD, well-controlled - Continue PPI   Hyperglycemia without history of diabetes Currently meets criteria for prediabetes -A1c 6.1; follow repeat CBC, avoid hypoglycemia   DVT prophylaxis: heparin injection 5,000 Units Start: 03/24/24 2200 Code Status:   Code Status: Full Code Family Communication: At bedside  Status is: Inpatient  Dispo: The patient is from: Home              Anticipated d/c is to: Home              Anticipated d/c date is: 24 to 48 hours              Patient currently not medically stable for discharge given need for further procedure and imaging as above  Consultants:  GI  Procedures:  ERCP planned 03/25/2024 Liver biopsy tentatively with IR planned 03/25/2024  Antimicrobials:  None indicated  Subjective: No acute issues or events overnight denies nausea vomiting diarrhea constipation no fever chills or chest  pain  Objective: Vitals:   03/24/24 1714 03/24/24 2157 03/25/24 0224 03/25/24 0637  BP: (!) 152/91 (!) 143/84 129/87 129/80  Pulse: 73 69 65 70  Resp: 18 18 17 16   Temp: 97.9 F (36.6 C) 98.2 F (36.8 C) 98.1 F (36.7 C) 98.2 F (36.8 C)  TempSrc: Oral Oral Oral Oral  SpO2: 99% 100% 99% 98%  Weight:      Height:        Intake/Output Summary (Last 24 hours) at 03/25/2024 0720 Last data filed at 03/25/2024 0522 Gross per 24 hour  Intake 270 ml  Output 600 ml  Net -330 ml   Filed Weights   03/24/24 1544  Weight: 91.8 kg    Examination: General:  Pleasantly resting in bed, No acute distress. HEENT:  Normocephalic atraumatic.  Sclerae nonicteric, noninjected.  Extraocular movements intact bilaterally. Neck:  Without mass or deformity.  Trachea is midline. Lungs:  Clear to auscultate bilaterally without rhonchi, wheeze, or rales. Heart:  Regular rate and rhythm.  Without murmurs, rubs, or gallops. Abdomen:  Soft, nontender, nondistended.  Without guarding or rebound. Extremities: Without cyanosis, clubbing, edema, or obvious deformity. Skin:  Warm and dry, no erythema.  Data Reviewed: I have personally reviewed following labs and imaging studies  CBC: Recent Labs  Lab 03/24/24 0845 03/24/24 1852 03/25/24 0530  WBC 4.9 4.4 4.8  NEUTROABS 3.6  --   --   HGB 10.8* 10.6* 10.3*  HCT 32.4* 32.7* 32.6*  MCV 60.9* 61.1* 61.0*  PLT 216 211 209   Basic Metabolic Panel: Recent Labs  Lab 03/24/24 0845 03/24/24 1852 03/25/24 0530  NA 137  --  139  K 3.7  --  3.9  CL 101  --  104  CO2 27  --  25  GLUCOSE 139*  --  102*  BUN 13  --  14  CREATININE 1.19 1.01 1.05  CALCIUM 10.2  --  9.4   GFR: Estimated Creatinine Clearance: 98.7 mL/min (by C-G formula based on SCr of 1.05 mg/dL).  Liver Function Tests: Recent Labs  Lab 03/24/24 0845 03/25/24 0530  AST 296* 246*  ALT 689* 623*  ALKPHOS 367* 319*  BILITOT 11.3*  11.3* 11.3*  PROT 7.8 6.9  ALBUMIN 4.2 3.9    Coagulation Profile: Recent Labs  Lab 03/24/24 1648  INR 1.0   HbA1C: Recent Labs    03/24/24 1852  HGBA1C 6.1*   Anemia Panel: Recent Labs    03/24/24 0845  FERRITIN 1,068*   No results found for this or any previous visit (from the past 240 hours).   Radiology Studies: CT ABDOMEN PELVIS W CONTRAST Result Date: 03/24/2024 CLINICAL DATA:  Elevated bilirubin. On chemotherapy for pancreatic carcinoma. * Tracking Code: BO * EXAM: CT ABDOMEN AND PELVIS WITH CONTRAST TECHNIQUE: Multidetector CT imaging of the abdomen and pelvis was performed using the standard protocol following bolus administration of intravenous contrast. RADIATION DOSE REDUCTION: This exam was performed according to the departmental dose-optimization program which includes automated exposure control, adjustment of the mA and/or kV according to patient size and/or use of iterative reconstruction technique. CONTRAST:  OMNIPAQUE  IOHEXOL  300 MG/ML  SOLN COMPARISON:  03/09/2024 FINDINGS: Lower chest: Clear lung bases. Normal heart size without pericardial or pleural effusion. Hepatobiliary: Bilobar hepatic metastasis. Index segment 3 mass measures 3.8 x 3.3 cm in 24/2 versus 3.2 x 3.4 cm when measured in a similar fashion on the prior. Index subcapsular right hepatic lobe lesion measures 2.0 cm on image 31/2 versus 2.3 cm when remeasured in a similar fashion on the prior. Index subcapsular right hepatic lobe 1.6 cm lesion on 20/2 measured 1.4 cm on the prior. Intrahepatic biliary duct dilatation is mild and minimally increased. Common duct measures 12 mm on coronal image 58 versus 9 mm on the prior exam when remeasured in similar fashion. Common duct is obstructed by the pancreatic head mass detailed below. Subtle stone or stones in the gallbladder fundus. Pancreas: Pancreatic head/uncinate process mass measures 2.7 x 3.0 cm on 31/2 and is felt to be similar to on the prior (when remeasured). The upstream pancreatic duct  dilatation and atrophy are moderate and similar. No evidence of superimposed pancreatitis. Spleen: Normal in size, without focal abnormality. Adrenals/Urinary Tract: Normal adrenal glands. Left renal collecting system calculi up to 1.0 cm Low-density left renal lesions up to 1.5 cm are likely cysts and do not warrant imaging follow-up. No hydronephrosis.  Normal urinary bladder. Stomach/Bowel: Normal stomach, without wall thickening. Normal colon and terminal ileum. Normal small bowel. Vascular/Lymphatic: Aortic atherosclerosis. Splenoportal confluence and SMV narrowing by tumor. No arterial encasement. No abdominopelvic adenopathy. Reproductive: Normal prostate. Other: No significant free fluid.  No free intraperitoneal air. Musculoskeletal: Lumbar spondylosis. IMPRESSION: 1. The pancreatic head/uncinate process primary and liver metastasis are relatively similar. 2. Increase in mild to moderate biliary duct dilatation. 3. Incidental findings, including: Left nephrolithiasis. Cholelithiasis. Case discussed with Dr. Cloretta  at 1:10 p.m. Electronically Signed   By: Rockey Kilts M.D.   On: 03/24/2024 13:22   Scheduled Meds:  amLODipine   10 mg Oral Daily  Chlorhexidine Gluconate Cloth  6 each Topical Daily   heparin  5,000 Units Subcutaneous Q8H   levothyroxine   125 mcg Oral Q0600   pantoprazole   40 mg Oral Daily   sodium chloride  flush  10-40 mL Intracatheter Q12H   sodium chloride  flush  10-40 mL Intracatheter Q12H   Continuous Infusions:   LOS: 1 day   Time spent:  Elsie JAYSON Montclair, DO [facility]  If 7PM-7AM, please contact night-coverage [email or web address]  03/25/2024, 7:20 AM

## 2024-03-25 NOTE — Progress Notes (Addendum)
 Go ERCP with stent today.  Seen by Dr. Cloretta his morning and he ordered liver biopsy.  LFTs obviously still elevated.  Patient does not have any new questions today.  GI ATTENDING As above. PT/INR normal. Norleen SAILOR. Abran Raddle., M.D. [facility] Division of Gastroenterology

## 2024-03-25 NOTE — Anesthesia Postprocedure Evaluation (Signed)
 Anesthesia Post Note  Patient: Douglas Harper  Procedure(s) Performed: ERCP, WITH INTERVENTION IF INDICATED     Patient location during evaluation: PACU Anesthesia Type: General Level of consciousness: awake and alert Pain management: pain level controlled Vital Signs Assessment: post-procedure vital signs reviewed and stable Respiratory status: spontaneous breathing, nonlabored ventilation, respiratory function stable and patient connected to nasal cannula oxygen Cardiovascular status: blood pressure returned to baseline and stable Postop Assessment: no apparent nausea or vomiting Anesthetic complications: no   No notable events documented.  Last Vitals:  Vitals:   03/25/24 1700 03/25/24 1721  BP: (!) 145/94 (!) 150/92  Pulse: 68 71  Resp: 12 14  Temp:    SpO2: 100% 97%    Last Pain:  Vitals:   03/25/24 1700  TempSrc:   PainSc: 0-No pain                 [name]

## 2024-03-25 NOTE — Transfer of Care (Signed)
 Immediate Anesthesia Transfer of Care Note  Patient: Douglas Harper  Procedure(s) Performed: ERCP, WITH INTERVENTION IF INDICATED  Patient Location: Endoscopy Unit  Anesthesia Type:General  Level of Consciousness: awake, alert , oriented, and patient cooperative  Airway & Oxygen Therapy: Patient Spontanous Breathing and Patient connected to face mask oxygen  Post-op Assessment: Report given to RN and Post -op Vital signs reviewed and stable  Post vital signs: Reviewed and stable  Last Vitals:  Vitals Value Taken Time  BP 152/93 03/25/24 16:41  Temp    Pulse 73 03/25/24 16:43  Resp 14 03/25/24 16:43  SpO2 95 % 03/25/24 16:43  Vitals shown include unfiled device data.  Last Pain:  Vitals:   03/25/24 1339  TempSrc: Temporal  PainSc: 0-No pain      Patients Stated Pain Goal: 0 (03/25/24 1339)  Complications: No notable events documented.

## 2024-03-25 NOTE — Op Note (Signed)
 [facility] Patient Name: Douglas Harper Procedure Date: 03/25/2024 MRN: [id number] Attending MD: Norleen SAILOR. Abran , MD, [id number] Date of Birth: Apr 02, 1971 CSN: [id number] Age: 53 Admit Type: Inpatient Procedure:                ERCP with biliary sphincterotomy and self-expanding                            metal stent placement Indications:              Abnormal abdominal CT, Biliary dilation on Computed                            Tomogram Scan, Jaundice, Malignant tumor of the                            head of pancreas Providers:                Norleen SAILOR. Abran, MD, Hoy Penner, RN, Coye Bade, Technician Referring MD:             Oncology Medicines:                General Anesthesia Complications:            No immediate complications. Estimated Blood Loss:     Estimated blood loss: none. Procedure:                Pre-Anesthesia Assessment:                           - Prior to the procedure, a History and Physical                            was performed, and patient medications and                            allergies were reviewed. The patient is competent.                            The risks and benefits of the procedure and the                            sedation options and risks were discussed with the                            patient. All questions were answered and informed                            consent was obtained. Patient identification and                            proposed procedure were verified by the physician.  Mental Status Examination: alert and oriented.                            Airway Examination: normal oropharyngeal airway and                            neck mobility. Respiratory Examination: clear to                            auscultation. CV Examination: normal. Prophylactic                            Antibiotics: The patient does not require                             prophylactic antibiotics. Prior Anticoagulants: The                            patient has taken no anticoagulant or antiplatelet                            agents. ASA Grade Assessment: III - A patient with                            severe systemic disease. After reviewing the risks                            and benefits, the patient was deemed in                            satisfactory condition to undergo the procedure.                            The anesthesia plan was to use moderate sedation /                            analgesia (conscious sedation). Immediately prior                            to administration of medications, the patient was                            re-assessed for adequacy to receive sedatives. The                            heart rate, respiratory rate, oxygen saturations,                            blood pressure, adequacy of pulmonary ventilation,                            and response to care were monitored throughout the  procedure. The physical status of the patient was                            re-assessed after the procedure.                           After obtaining informed consent, the scope was                            passed under direct vision. Throughout the                            procedure, the patient's blood pressure, pulse, and                            oxygen saturations were monitored continuously. The                            [id number]-[id number] ([id number] Olympus duodenoscope was                            introduced through the mouth, and used to inject                            contrast into and used to inject contrast into the                            bile duct and ventral pancreatic duct. The ERCP was                            accomplished without difficulty. The patient                            tolerated the procedure well. Scope In: Scope Out: Findings:      1. The side-viewing endoscope was passed  blindly into the esophagus. The       stomach and duodenum were grossly normal as well as the duodenum and       major ampulla. The minor ampulla was not sought      2. A scout radiograph of the abdomen with the endoscope and position was       unremarkable      3. Biliary cannulation was difficult. Initial attempts at biliary       cannulation yielded a pancreatogram which demonstrated stricturing and       ectatic vessels in the head with upstream dilation proximal.      4. Attempted double wire cannulation was not successful      5. Bile duct was subsequently accessed with revolution catheter wire       system.      6. Injection of contrast yielded a short distal biliary stricture with       marked upstream dilation filling of the gallbladder.      7. A biliary sphincterotomy was made cutting over the guidewire in the       12:00 orientation using the ERBE system      8. A 10 mm diameter 6  cm in length uncovered self-expanding metal stent       was placed into the biliary tree. Excellent drainage. The stent was       above the stricture with good drainage, though there was some concern       that it may have had the cystic duct. Injection of contrast multiple       occasions demonstrated the biliary system drained readily. Impression:               1. Head of the pancreas cancer with double duct sign                           2. Malignant obstructive jaundice secondary to                            distal biliary stricture status post biliary                            sphincterotomy and self-expanding metal stent                            placement                           3. Difficult case as described Moderate Sedation:      none Recommendation:           1. Standard post ERCP observation and care                           2. Observe patient's clinical course.                           3. Trend liver tests                           4. Ongoing management of cancer per  oncology Procedure Code(s):        --- Professional ---                           [phone], Endoscopic retrograde                            cholangiopancreatography (ERCP); with placement of                            endoscopic stent into biliary or pancreatic duct,                            including pre- and post-dilation and guide wire                            passage, when performed, including sphincterotomy,                            when performed, each stent Diagnosis Code(s):        --- Professional ---  K83.8, Other specified diseases of biliary tract                           Z90.49, Acquired absence of other specified parts                            of digestive tract                           R17, Unspecified jaundice                           C25.0, Malignant neoplasm of head of pancreas                           R93.5, Abnormal findings on diagnostic imaging of                            other abdominal regions, including retroperitoneum CPT copyright 2022 [employer]. All rights reserved. The codes documented in this report are preliminary and upon coder review may  be revised to meet current compliance requirements. Norleen SAILOR. Abran, MD 03/25/2024 4:43:49 PM This report has been signed electronically. Number of Addenda: 0

## 2024-03-25 NOTE — Plan of Care (Signed)
   Problem: Activity: Goal: Risk for activity intolerance will decrease Outcome: Progressing   Problem: Nutrition: Goal: Adequate nutrition will be maintained Outcome: Progressing   Problem: Coping: Goal: Level of anxiety will decrease Outcome: Progressing

## 2024-03-25 NOTE — Plan of Care (Signed)
  Problem: Clinical Measurements: Goal: Ability to maintain clinical measurements within normal limits will improve Outcome: Progressing   Problem: Nutrition: Goal: Adequate nutrition will be maintained Outcome: Progressing   

## 2024-03-25 NOTE — Progress Notes (Signed)
 IP PROGRESS NOTE  Subjective:   Douglas Harper reports feeling well.  He has mild diffuse pruritus.  He had discomfort in the xiphoid region after eating last night.  No vomiting.  His urine is dark.  Objective: Vital signs in last 24 hours: Blood pressure 129/80, pulse 70, temperature 98.2 F (36.8 C), temperature source Oral, resp. rate 16, height 6' 4 (1.93 m), weight 202 lb 6.4 oz (91.8 kg), SpO2 98%.  Intake/Output from previous day: 10/06 0701 - 10/07 0700 In: 270 [P.O.:270] Out: 600 [Urine:600]  Physical Exam: HEENT: Scleral icterus Lungs: Clear bilaterally Cardiac: Regular rate and rhythm Abdomen: Nontender, no mass, no hepatosplenomegaly Extremities: No leg edema   Portacath/PICC-without erythema  Lab Results: Recent Labs    03/24/24 1852 03/25/24 0530  WBC 4.4 4.8  HGB 10.6* 10.3*  HCT 32.7* 32.6*  PLT 211 209    BMET Recent Labs    03/24/24 0845 03/24/24 1852 03/25/24 0530  NA 137  --  139  K 3.7  --  3.9  CL 101  --  104  CO2 27  --  25  GLUCOSE 139*  --  102*  BUN 13  --  14  CREATININE 1.19 1.01 1.05  CALCIUM 10.2  --  9.4    Lab Results  Component Value Date   CEA1 43.8 (H) 03/10/2024   RJW800 20,185 (H) 03/10/2024    Studies/Results: CT ABDOMEN PELVIS W CONTRAST Result Date: 03/24/2024 CLINICAL DATA:  Elevated bilirubin. On chemotherapy for pancreatic carcinoma. * Tracking Code: BO * EXAM: CT ABDOMEN AND PELVIS WITH CONTRAST TECHNIQUE: Multidetector CT imaging of the abdomen and pelvis was performed using the standard protocol following bolus administration of intravenous contrast. RADIATION DOSE REDUCTION: This exam was performed according to the departmental dose-optimization program which includes automated exposure control, adjustment of the mA and/or kV according to patient size and/or use of iterative reconstruction technique. CONTRAST:  OMNIPAQUE  IOHEXOL  300 MG/ML  SOLN COMPARISON:  03/09/2024 FINDINGS: Lower chest: Clear lung  bases. Normal heart size without pericardial or pleural effusion. Hepatobiliary: Bilobar hepatic metastasis. Index segment 3 mass measures 3.8 x 3.3 cm in 24/2 versus 3.2 x 3.4 cm when measured in a similar fashion on the prior. Index subcapsular right hepatic lobe lesion measures 2.0 cm on image 31/2 versus 2.3 cm when remeasured in a similar fashion on the prior. Index subcapsular right hepatic lobe 1.6 cm lesion on 20/2 measured 1.4 cm on the prior. Intrahepatic biliary duct dilatation is mild and minimally increased. Common duct measures 12 mm on coronal image 58 versus 9 mm on the prior exam when remeasured in similar fashion. Common duct is obstructed by the pancreatic head mass detailed below. Subtle stone or stones in the gallbladder fundus. Pancreas: Pancreatic head/uncinate process mass measures 2.7 x 3.0 cm on 31/2 and is felt to be similar to on the prior (when remeasured). The upstream pancreatic duct dilatation and atrophy are moderate and similar. No evidence of superimposed pancreatitis. Spleen: Normal in size, without focal abnormality. Adrenals/Urinary Tract: Normal adrenal glands. Left renal collecting system calculi up to 1.0 cm Low-density left renal lesions up to 1.5 cm are likely cysts and do not warrant imaging follow-up. No hydronephrosis.  Normal urinary bladder. Stomach/Bowel: Normal stomach, without wall thickening. Normal colon and terminal ileum. Normal small bowel. Vascular/Lymphatic: Aortic atherosclerosis. Splenoportal confluence and SMV narrowing by tumor. No arterial encasement. No abdominopelvic adenopathy. Reproductive: Normal prostate. Other: No significant free fluid.  No free intraperitoneal air. Musculoskeletal:  Lumbar spondylosis. IMPRESSION: 1. The pancreatic head/uncinate process primary and liver metastasis are relatively similar. 2. Increase in mild to moderate biliary duct dilatation. 3. Incidental findings, including: Left nephrolithiasis. Cholelithiasis. Case  discussed with Dr. Cloretta  at 1:10 p.m. Electronically Signed   By: Rockey Kilts M.D.   On: 03/24/2024 13:22    Medications: I have reviewed the patient's current medications.  Assessment/Plan:  Pancreas cancer, stage IV (cT1c, cN0, pM1) 03/09/2024 CTs chest, abdomen, and pelvis: 1.6 x 1.9 cm pancreas head mass, upstream pancreatic duct dilation, multiple hypoattenuating liver lesions, fat stranding surrounding the pancreas head and proximal duodenum, no metastatic disease in the chest Ultrasound-guided biopsy of left liver mass 03/11/2024: Invasive moderately differentiated pancreatic adenocarcinoma, pancreas with severe ductal dysplasia (PanIN-3), no hepatic parenchyma is present Elevated CA 19-9 Elevated liver enzymes and bilirubin Progressive rise in liver enzymes and bilirubin 03/24/2024 CTs 03/24/2024-increase in mild to moderate biliary duct dilatation; pancreatic head/uncinate process mass and liver metastasis relatively similar Pain secondary to #1 Hypothyroidism Family history of colon cancer Red cell microcytosis Hyperthyroidism status post RAI in 2009 Gastroesophageal reflux disease Glaucoma Hypertension Admission 03/24/2024 with obstructive jaundice secondary to the pancreas head mass   Douglas Harper appears stable.  The bilirubin and liver enzymes remain elevated.  He was seen by gastroenterology yesterday and is scheduled for an ERCP/stent placement today.  I will place an order for an ultrasound-guided biopsy of the liver lesion to confirm a diagnosis of metastatic pancreas cancer.  He agrees to proceed with a repeat liver biopsy.  The plan is to proceed with systemic chemotherapy when the hyperbilirubinemia and elevated liver enzymes improve.  Recommendations: Proceed with ERCP today IR consult for ultrasound-guided biopsy of a liver lesion Follow-up hemoglobin electrophoresis to evaluate the red cell microcytosis Outpatient follow-up as scheduled at the Cancer center     LOS: 1 day   Arley Cloretta, MD   03/25/2024, 8:01 AM

## 2024-03-25 NOTE — Progress Notes (Signed)
   03/25/24 1145  TOC Brief Assessment  Insurance and Status Reviewed  Patient has primary care physician Yes  Home environment has been reviewed single family home  Prior level of function: independent  Prior/Current Home Services No current home services  Social Drivers of Health Review SDOH reviewed no interventions necessary  Readmission risk has been reviewed Yes  Transition of care needs no transition of care needs at this time    Signed: Heather Saltness, MSW, LCSW Clinical Social Worker Inpatient Care Management 03/25/2024 11:45 AM

## 2024-03-26 ENCOUNTER — Inpatient Hospital Stay (HOSPITAL_COMMUNITY)

## 2024-03-26 ENCOUNTER — Other Ambulatory Visit (HOSPITAL_BASED_OUTPATIENT_CLINIC_OR_DEPARTMENT_OTHER): Payer: Self-pay

## 2024-03-26 ENCOUNTER — Encounter

## 2024-03-26 DIAGNOSIS — R7989 Other specified abnormal findings of blood chemistry: Secondary | ICD-10-CM

## 2024-03-26 DIAGNOSIS — K838 Other specified diseases of biliary tract: Secondary | ICD-10-CM | POA: Diagnosis not present

## 2024-03-26 DIAGNOSIS — C250 Malignant neoplasm of head of pancreas: Secondary | ICD-10-CM | POA: Diagnosis not present

## 2024-03-26 DIAGNOSIS — I10 Essential (primary) hypertension: Secondary | ICD-10-CM | POA: Diagnosis not present

## 2024-03-26 DIAGNOSIS — R935 Abnormal findings on diagnostic imaging of other abdominal regions, including retroperitoneum: Secondary | ICD-10-CM

## 2024-03-26 DIAGNOSIS — R739 Hyperglycemia, unspecified: Secondary | ICD-10-CM

## 2024-03-26 DIAGNOSIS — K831 Obstruction of bile duct: Secondary | ICD-10-CM | POA: Diagnosis not present

## 2024-03-26 LAB — CBC WITH DIFFERENTIAL/PLATELET
Abs Immature Granulocytes: 0.01 K/uL (ref 0.00–0.07)
Basophils Absolute: 0 K/uL (ref 0.0–0.1)
Basophils Relative: 0 %
Eosinophils Absolute: 0 K/uL (ref 0.0–0.5)
Eosinophils Relative: 0 %
HCT: 31.7 % — ABNORMAL LOW (ref 39.0–52.0)
Hemoglobin: 10.2 g/dL — ABNORMAL LOW (ref 13.0–17.0)
Immature Granulocytes: 0 %
Lymphocytes Relative: 15 %
Lymphs Abs: 0.9 K/uL (ref 0.7–4.0)
MCH: 19.5 pg — ABNORMAL LOW (ref 26.0–34.0)
MCHC: 32.2 g/dL (ref 30.0–36.0)
MCV: 60.5 fL — ABNORMAL LOW (ref 80.0–100.0)
Monocytes Absolute: 0.6 K/uL (ref 0.1–1.0)
Monocytes Relative: 10 %
Neutro Abs: 4.4 K/uL (ref 1.7–7.7)
Neutrophils Relative %: 75 %
Platelets: 212 K/uL (ref 150–400)
RBC: 5.24 MIL/uL (ref 4.22–5.81)
RDW: 15.9 % — ABNORMAL HIGH (ref 11.5–15.5)
Smear Review: NORMAL
WBC: 5.9 K/uL (ref 4.0–10.5)
nRBC: 0 % (ref 0.0–0.2)

## 2024-03-26 LAB — HGB FRACTIONATION CASCADE
Hgb A2: 5.4 % — ABNORMAL HIGH (ref 1.8–3.2)
Hgb A: 94.6 % — ABNORMAL LOW (ref 96.4–98.8)
Hgb F: 0 % (ref 0.0–2.0)
Hgb S: 0 %

## 2024-03-26 LAB — COMPREHENSIVE METABOLIC PANEL WITH GFR
ALT: 497 U/L — ABNORMAL HIGH (ref 0–44)
AST: 178 U/L — ABNORMAL HIGH (ref 15–41)
Albumin: 3.6 g/dL (ref 3.5–5.0)
Alkaline Phosphatase: 305 U/L — ABNORMAL HIGH (ref 38–126)
Anion gap: 10 (ref 5–15)
BUN: 16 mg/dL (ref 6–20)
CO2: 25 mmol/L (ref 22–32)
Calcium: 9.5 mg/dL (ref 8.9–10.3)
Chloride: 102 mmol/L (ref 98–111)
Creatinine, Ser: 1.25 mg/dL — ABNORMAL HIGH (ref 0.61–1.24)
GFR, Estimated: >60 mL/min (ref >60)
Glucose, Bld: 109 mg/dL — ABNORMAL HIGH (ref 70–99)
Potassium: 3.9 mmol/L (ref 3.5–5.1)
Sodium: 138 mmol/L (ref 135–145)
Total Bilirubin: 11.1 mg/dL — ABNORMAL HIGH (ref 0.0–1.2)
Total Protein: 6.9 g/dL (ref 6.5–8.1)

## 2024-03-26 LAB — MAGNESIUM: Magnesium: 1.9 mg/dL (ref 1.7–2.4)

## 2024-03-26 MED ORDER — LIDOCAINE HCL 1 % IJ SOLN
INTRAMUSCULAR | Status: AC
  Filled 2024-03-26: qty 20

## 2024-03-26 MED ORDER — GELATIN ABSORBABLE 12-7 MM EX MISC
CUTANEOUS | Status: AC
  Filled 2024-03-26: qty 1

## 2024-03-26 MED ORDER — FENTANYL CITRATE (PF) 100 MCG/2ML IJ SOLN
INTRAMUSCULAR | Status: AC
  Filled 2024-03-26: qty 2

## 2024-03-26 MED ORDER — MIDAZOLAM HCL 2 MG/2ML IJ SOLN
INTRAMUSCULAR | Status: AC | PRN
  Administered 2024-03-26: 1 mg via INTRAVENOUS

## 2024-03-26 MED ORDER — FENTANYL CITRATE (PF) 100 MCG/2ML IJ SOLN
INTRAMUSCULAR | Status: AC | PRN
  Administered 2024-03-26: 50 ug via INTRAVENOUS

## 2024-03-26 MED ORDER — MIDAZOLAM HCL 2 MG/2ML IJ SOLN
INTRAMUSCULAR | Status: AC
  Filled 2024-03-26: qty 2

## 2024-03-26 MED ORDER — HEPARIN SOD (PORK) LOCK FLUSH 100 UNIT/ML IV SOLN
500.0000 [IU] | INTRAVENOUS | Status: AC | PRN
  Administered 2024-03-26: 500 [IU]
  Filled 2024-03-26: qty 5

## 2024-03-26 MED ORDER — HEPARIN SODIUM (PORCINE) 5000 UNIT/ML IJ SOLN: 5000.0000 [IU] | Freq: Three times a day (TID) | INTRAMUSCULAR | Status: DC

## 2024-03-26 NOTE — Progress Notes (Signed)
 Discharge instructions were reviewed with patient and all questions were answered. Patient taken to main entrance by NT.

## 2024-03-26 NOTE — Plan of Care (Signed)

## 2024-03-26 NOTE — Procedures (Signed)
 Vascular and Interventional Radiology Procedure Note  Patient: Douglas Harper DOB: April 29, 1971 Medical Record Number: [id number] Note Date/Time: 03/26/24 11:48 AM   Performing Physician: Thom Hall, MD Assistant(s): None  Diagnosis: Liver mass. Hx pancraetic CA   Procedure: LIVER MASS BIOPSY  Anesthesia: Conscious Sedation Complications: None Estimated Blood Loss: Minimal Specimens: Sent for Pathology  Findings:  Successful Ultrasound-guided biopsy of liver mass. A total of 3 samples were obtained. Hemostasis of the tract was achieved using Manual Pressure.  Plan: Bed rest for 2 hours.  See detailed procedure note with images in PACS. The patient tolerated the procedure well without incident or complication and was returned to Recovery in stable condition.    Thom Hall, MD [facility]   Pager. [phone] Clinic. [phone]

## 2024-03-26 NOTE — Progress Notes (Addendum)
 Patient ID: Douglas Harper, male   DOB: 06-Oct-1970, 53 y.o.   MRN: [id number]    Referring Physician(s): Sherrill,B  Supervising Physician: Hughes Simmonds  Patient Status:  [facility] - In-pt  Chief Complaint:  Stage IV pancreatic cancer with malignant obstructive jaundice/distal biliary stricture; liver lesions, elevated liver function tests; referred for image guided liver lesion biopsy  Subjective: Patient known to IR team from left lobe liver mass biopsy on 03/11/2024 with pathology revealing invasive moderately differentiated pancreatic adenocarcinoma; specimen was negative for hepatic parenchyma.  Patient is also status post Port-A-Cath placement on 03/17/2024.  Past medical history significant for hyperthyroidism status post RAI in 2009, hypothyroidism,, hypertension, GERD, red cell microcytosis, glaucoma.  Patient is status post ERCP with biliary sphincterotomy and self-expanding biliary metal stent placement on 03/25/2024.  Request now received for image guided liver lesion biopsy to confirm metastatic pancreatic cancer.  Patient currently denies fever, headache, chest pain, dyspnea, cough, abdominal/back pain, nausea, vomiting or bleeding.  Past Medical History:  Diagnosis Date   COVID-19    03/17/20   FH: heart disease    GERD (gastroesophageal reflux disease)    Glaucoma    Hypertension    Hyperthyroidism    s/p RAI 2009    Hypothyroidism    Verruca vulgaris    Past Surgical History:  Procedure Laterality Date   ANTERIOR CRUCIATE LIGAMENT REPAIR Right 2000   HAND SURGERY Right 2001   with hardware implanted   IR IMAGING GUIDED PORT INSERTION  03/17/2024   NO PAST SURGERIES        Allergies: Patient has no known allergies.  Medications: Prior to Admission medications   Medication Sig Start Date End Date Taking? Authorizing Provider  amLODipine  (NORVASC ) 10 MG tablet Take 1 tablet (10 mg total) by mouth daily. 03/13/24  Yes Darci Pore, MD  HYDROmorphone  (DILAUDID ) 2  MG tablet Take 0.5 tablets (1 mg total) by mouth every 6 (six) hours as needed for severe pain (pain score 7-10). 03/18/24  Yes Debby Olam POUR, NP  levothyroxine  (SYNTHROID ) 125 MCG tablet TAKE 1 TABLET BY MOUTH 30 MINUTES BEFORE BREAKFAST (STOP DOSE) Patient taking differently: Take 125 mcg by mouth daily before breakfast. 12/14/23 12/13/24 Yes Mercer Clotilda SAUNDERS, MD  omeprazole (PRILOSEC) 40 MG capsule Take 40 mg by mouth daily. 02/29/24  Yes [provider]  ondansetron  (ZOFRAN ) 4 MG tablet Take 1 tablet (4 mg total) by mouth every 8 (eight) hours as needed for nausea or vomiting. 03/20/24  Yes Cloretta Arley NOVAK, MD  prochlorperazine (COMPAZINE) 10 MG tablet Take 1 tablet (10 mg total) by mouth every 6 (six) hours as needed for nausea or vomiting. 03/21/24  Yes Cloretta Arley NOVAK, MD  lidocaine -prilocaine (EMLA) cream Apply 1 Application topically as needed. Apply 1 Application topically as needed (Apply 1 hours prior to appt) Patient not taking: No sig reported 03/20/24   Cloretta Arley NOVAK, MD     Vital Signs: BP 121/79 (BP Location: Left Arm)   Pulse 66   Temp (!) 97.4 F (36.3 C) (Oral)   Resp 18   Ht 6' 4 (1.93 m)   Wt 202 lb 6.4 oz (91.8 kg)   SpO2 99%   BMI 24.64 kg/m   Physical Exam awake, alert.  Scleral icterus; Chest clear to auscultation bilaterally.  Clean, intact left chest wall Port-A-Cath.  Heart with regular rate and rhythm.  Abdomen soft, positive bowel sounds, nontender.  No lower extremity edema.  Imaging: DG C-Arm 1-60 Min-No Report  Result Date: 03/25/2024 Fluoroscopy was utilized by the requesting physician.  No radiographic interpretation.   DG C-Arm 1-60 Min-No Report Result Date: 03/25/2024 Fluoroscopy was utilized by the requesting physician.  No radiographic interpretation.   CT ABDOMEN PELVIS W CONTRAST Result Date: 03/24/2024 CLINICAL DATA:  Elevated bilirubin. On chemotherapy for pancreatic carcinoma. * Tracking Code: BO * EXAM: CT ABDOMEN AND  PELVIS WITH CONTRAST TECHNIQUE: Multidetector CT imaging of the abdomen and pelvis was performed using the standard protocol following bolus administration of intravenous contrast. RADIATION DOSE REDUCTION: This exam was performed according to the departmental dose-optimization program which includes automated exposure control, adjustment of the mA and/or kV according to patient size and/or use of iterative reconstruction technique. CONTRAST:  OMNIPAQUE  IOHEXOL  300 MG/ML  SOLN COMPARISON:  03/09/2024 FINDINGS: Lower chest: Clear lung bases. Normal heart size without pericardial or pleural effusion. Hepatobiliary: Bilobar hepatic metastasis. Index segment 3 mass measures 3.8 x 3.3 cm in 24/2 versus 3.2 x 3.4 cm when measured in a similar fashion on the prior. Index subcapsular right hepatic lobe lesion measures 2.0 cm on image 31/2 versus 2.3 cm when remeasured in a similar fashion on the prior. Index subcapsular right hepatic lobe 1.6 cm lesion on 20/2 measured 1.4 cm on the prior. Intrahepatic biliary duct dilatation is mild and minimally increased. Common duct measures 12 mm on coronal image 58 versus 9 mm on the prior exam when remeasured in similar fashion. Common duct is obstructed by the pancreatic head mass detailed below. Subtle stone or stones in the gallbladder fundus. Pancreas: Pancreatic head/uncinate process mass measures 2.7 x 3.0 cm on 31/2 and is felt to be similar to on the prior (when remeasured). The upstream pancreatic duct dilatation and atrophy are moderate and similar. No evidence of superimposed pancreatitis. Spleen: Normal in size, without focal abnormality. Adrenals/Urinary Tract: Normal adrenal glands. Left renal collecting system calculi up to 1.0 cm Low-density left renal lesions up to 1.5 cm are likely cysts and do not warrant imaging follow-up. No hydronephrosis.  Normal urinary bladder. Stomach/Bowel: Normal stomach, without wall thickening. Normal colon and terminal ileum.  Normal small bowel. Vascular/Lymphatic: Aortic atherosclerosis. Splenoportal confluence and SMV narrowing by tumor. No arterial encasement. No abdominopelvic adenopathy. Reproductive: Normal prostate. Other: No significant free fluid.  No free intraperitoneal air. Musculoskeletal: Lumbar spondylosis. IMPRESSION: 1. The pancreatic head/uncinate process primary and liver metastasis are relatively similar. 2. Increase in mild to moderate biliary duct dilatation. 3. Incidental findings, including: Left nephrolithiasis. Cholelithiasis. Case discussed with Dr. Cloretta  at 1:10 p.m. Electronically Signed   By: Rockey Kilts M.D.   On: 03/24/2024 13:22    Labs:  CBC: Recent Labs    03/14/24 1337 03/24/24 0845 03/24/24 1852 03/25/24 0530  WBC 5.4 4.9 4.4 4.8  HGB 12.4* 10.8* 10.6* 10.3*  HCT 39.1 32.4* 32.7* 32.6*  PLT 195 216 211 209    COAGS: Recent Labs    03/10/24 0604 03/24/24 1648  INR 1.0 1.0    BMP: Recent Labs    03/12/24 0325 03/14/24 1337 03/24/24 0845 03/24/24 1852 03/25/24 0530  NA 136 136 137  --  139  K 3.4* 3.7 3.7  --  3.9  CL 102 99 101  --  104  CO2 25 26 27   --  25  GLUCOSE 174* 116* 139*  --  102*  BUN 9 17 13   --  14  CALCIUM 9.0 10.1 10.2  --  9.4  CREATININE 1.05 1.15 1.19 1.01 1.05  GFRNONAA >60 >60 >60 >60 >60    LIVER FUNCTION TESTS: Recent Labs    03/12/24 0325 03/14/24 1337 03/24/24 0845 03/25/24 0530  BILITOT 4.2* 3.4* 11.3*  11.3* 11.3*  AST 298* 208* 296* 246*  ALT 339* 417* 689* 623*  ALKPHOS 196* 305* 367* 319*  PROT 7.0 8.4* 7.8 6.9  ALBUMIN 3.3* 4.3 4.2 3.9    Assessment and Plan: Patient known to IR team from left lobe liver mass biopsy on 03/11/2024 with pathology revealing invasive moderately differentiated pancreatic adenocarcinoma; specimen was negative for hepatic parenchyma.  Patient is also status post Port-A-Cath placement on 03/17/2024.  Past medical history significant for hyperthyroidism status post RAI in 2009,  hypothyroidism,, hypertension, GERD, red cell microcytosis, glaucoma.  Patient is status post ERCP with biliary sphincterotomy and self-expanding biliary metal stent placement on 03/25/2024.  Request now received from oncology  for image guided liver lesion biopsy to confirm metastatic pancreatic cancer.  Latest imaging studies were reviewed by Dr. Hughes. Risks and benefits of procedure was discussed with the patient  including, but not limited to bleeding, infection, damage to adjacent structures or low yield requiring additional tests.  All of the questions were answered and there is agreement to proceed.  Consent signed and in chart.  Procedure scheduled for today.   Electronically Signed: D. Franky Rakers, PA-C 03/26/2024, 9:23 AM   I spent a total of 20 minutes at the the patient's bedside AND on the patient's hospital floor or unit, greater than 50% of which was counseling/coordinating care for image guided liver lesion biopsy

## 2024-03-26 NOTE — Progress Notes (Addendum)
  Gastroenterology Progress Note  CC:  CBD obstruction/pancreatic cancer  Subjective:  Feels fine.  No abdominal pain.  Had 2 BMs yesterday.  Waiting to get liver biopsy so hopefully he can go home later today.  Objective:  Vital signs in last 24 hours: Temp:  [97.3 F (36.3 C)-98.3 F (36.8 C)] 97.4 F (36.3 C) (10/08 0534) Pulse Rate:  [61-74] 66 (10/08 0534) Resp:  [12-18] 18 (10/08 0534) BP: (121-169)/(79-97) 121/79 (10/08 0534) SpO2:  [96 %-100 %] 99 % (10/08 0534) Weight:  [91.8 kg] 91.8 kg (10/07 1339) Last BM Date : 03/25/24 General:  Alert, Well-developed, in NAD Heart:  Regular rate and rhythm; no murmurs Pulm:  No W/R/R. Abdomen:  Soft, non-distended.  BS present.  Non-tender. Extremities:  Without edema. Neurologic:  Alert and oriented x 4;  grossly normal neurologically. Psych:  Alert and cooperative. Normal mood and affect.  Intake/Output from previous day: 10/07 0701 - 10/08 0700 In: 1988.6 [P.O.:840; I.V.:1048.6; IV Piggyback:100] Out: 0   Lab Results: Recent Labs    03/24/24 0845 03/24/24 1852 03/25/24 0530  WBC 4.9 4.4 4.8  HGB 10.8* 10.6* 10.3*  HCT 32.4* 32.7* 32.6*  PLT 216 211 209   BMET Recent Labs    03/24/24 0845 03/24/24 1852 03/25/24 0530  NA 137  --  139  K 3.7  --  3.9  CL 101  --  104  CO2 27  --  25  GLUCOSE 139*  --  102*  BUN 13  --  14  CREATININE 1.19 1.01 1.05  CALCIUM 10.2  --  9.4   LFT Recent Labs    03/24/24 0845 03/25/24 0530  PROT 7.8 6.9  ALBUMIN 4.2 3.9  AST 296* 246*  ALT 689* 623*  ALKPHOS 367* 319*  BILITOT 11.3*  11.3* 11.3*  BILIDIR 7.6*  --   IBILI 3.7*  --    PT/INR Recent Labs    03/24/24 1648  LABPROT 13.9  INR 1.0   DG C-Arm 1-60 Min-No Report Result Date: 03/25/2024 Fluoroscopy was utilized by the requesting physician.  No radiographic interpretation.   DG C-Arm 1-60 Min-No Report Result Date: 03/25/2024 Fluoroscopy was utilized by the requesting physician.  No  radiographic interpretation.   CT ABDOMEN PELVIS W CONTRAST Result Date: 03/24/2024 CLINICAL DATA:  Elevated bilirubin. On chemotherapy for pancreatic carcinoma. * Tracking Code: BO * EXAM: CT ABDOMEN AND PELVIS WITH CONTRAST TECHNIQUE: Multidetector CT imaging of the abdomen and pelvis was performed using the standard protocol following bolus administration of intravenous contrast. RADIATION DOSE REDUCTION: This exam was performed according to the departmental dose-optimization program which includes automated exposure control, adjustment of the mA and/or kV according to patient size and/or use of iterative reconstruction technique. CONTRAST:  OMNIPAQUE  IOHEXOL  300 MG/ML  SOLN COMPARISON:  03/09/2024 FINDINGS: Lower chest: Clear lung bases. Normal heart size without pericardial or pleural effusion. Hepatobiliary: Bilobar hepatic metastasis. Index segment 3 mass measures 3.8 x 3.3 cm in 24/2 versus 3.2 x 3.4 cm when measured in a similar fashion on the prior. Index subcapsular right hepatic lobe lesion measures 2.0 cm on image 31/2 versus 2.3 cm when remeasured in a similar fashion on the prior. Index subcapsular right hepatic lobe 1.6 cm lesion on 20/2 measured 1.4 cm on the prior. Intrahepatic biliary duct dilatation is mild and minimally increased. Common duct measures 12 mm on coronal image 58 versus 9 mm on the prior exam when remeasured in similar fashion. Common duct  is obstructed by the pancreatic head mass detailed below. Subtle stone or stones in the gallbladder fundus. Pancreas: Pancreatic head/uncinate process mass measures 2.7 x 3.0 cm on 31/2 and is felt to be similar to on the prior (when remeasured). The upstream pancreatic duct dilatation and atrophy are moderate and similar. No evidence of superimposed pancreatitis. Spleen: Normal in size, without focal abnormality. Adrenals/Urinary Tract: Normal adrenal glands. Left renal collecting system calculi up to 1.0 cm Low-density left renal  lesions up to 1.5 cm are likely cysts and do not warrant imaging follow-up. No hydronephrosis.  Normal urinary bladder. Stomach/Bowel: Normal stomach, without wall thickening. Normal colon and terminal ileum. Normal small bowel. Vascular/Lymphatic: Aortic atherosclerosis. Splenoportal confluence and SMV narrowing by tumor. No arterial encasement. No abdominopelvic adenopathy. Reproductive: Normal prostate. Other: No significant free fluid.  No free intraperitoneal air. Musculoskeletal: Lumbar spondylosis. IMPRESSION: 1. The pancreatic head/uncinate process primary and liver metastasis are relatively similar. 2. Increase in mild to moderate biliary duct dilatation. 3. Incidental findings, including: Left nephrolithiasis. Cholelithiasis. Case discussed with Dr. Cloretta  at 1:10 p.m. Electronically Signed   By: Rockey Kilts M.D.   On: 03/24/2024 13:22    Assessment / Plan: 53 year old male with new diagnosis of pancreatic adenocarcinoma with metastatic disease to the liver.  Was supposed start chemotherapy today, but LFTs found to be further elevated from previous with a total bili of 11.  CT scan showed increase in mild to moderate biliary ductal dilatation.  Common duct is obstructed by the pancreatic head mass.  ERCP 10/7:  Malignant obstructive jaundice secondary to distal biliary stricture status post biliary sphincterotomy and self-expanding metal stent placement.  Labs pending for today.   PLAN: - Biopsy of liver lesion today with IR. -Trend other labs/await labs that were ordered for this AM.    LOS: 2 days   Douglas Harper. Zehr  03/26/2024, 8:43 AM  GI ATTENDING  Interval history and data reviewed.  Agree with interval progress note as outlined.  No issues post ERCP with sphincterotomy and stent placement.  Blood work pending.  Liver biopsy today.  Please continue to trend liver tests.  Management at this point per oncology. Please contact GI for questions or problems.  Will sign off.  Douglas Harper.  Abran Raddle., M.D. [facility] Division of Gastroenterology

## 2024-03-26 NOTE — Discharge Summary (Signed)
 Physician Discharge Summary  Douglas Harper FMW:[id number] DOB: 1970/09/05 DOA: 03/24/2024  PCP: [facility]  Admit date: 03/24/2024 Discharge date: 03/26/2024  Time spent: 60 minutes  Recommendations for Outpatient Follow-up:  Follow-up with Dr. Cloretta, oncology as scheduled.  On follow-up patient will need a comprehensive metabolic profile done to follow-up on electrolytes, renal function, LFTs.  Patient will need a CBC done to follow-up on counts. Follow-up with [facility] in 3 weeks.  Patient's hyperglycemia needs to be reassessed on follow-up.   Discharge Diagnoses:  Principal Problem:   Biliary obstruction (HCC) Active Problems:   Abnormal CT of the abdomen   Malignant neoplasm of head of pancreas (HCC)   Obstructive jaundice (HCC)   Hyperglycemia   Discharge Condition: Stable and improved  Diet recommendation: Regular  Filed Weights   03/24/24 1544 03/25/24 1339  Weight: 91.8 kg 91.8 kg    History of present illness:  HPI per Dr. Ricky Lynwood FORBES Ee is a 53 y.o. male with medical history significant of hypothyroidism, hypertension, gastroesophageal flux disease and pancreatic cancer; who presented to the hospital secondary to elevated LFT's.    Patient was seen at cancer center for initiation of chemotherapy and despite being currently asymptomatic his blood work was concerning for elevated bilirubin and worsening LFT's, CT demonstrated mild to moderate biliary duct dilatation.   Case was discussed with gastroenterology service and the plan is for ERCP/stenting 03/25/2024.   Patient bilirubin 11.3 (was 3.4 approximately 10 days ago); LFTs demonstrating alk phos 267, ALT 639 and AST 296.   Hospital Course:  #1 acute worsening transaminitis/hyperbilirubinemia in the setting of known pancreatic cancer/rule out obstructive pathology/chronic abdominal pain - Patient admitted secondary to worsening LFTs after being seen at the  cancer center for initiation of chemotherapy. - Patient however remained asymptomatic with worsening LFTs. - CT abdomen and pelvis obtained with pancreatic head/uncinate process primary and liver metastases are relatively similar.  Increasing mild to moderate biliary duct dilatation.  Incidental findings including left nephrolithiasis, cholelithiasis. - Seen in consultation by GI and patient subsequently underwent ERCP with biliary sphincterotomy and placement of self-expanding metal stent on 03/25/2024 without any complications. - Patient subsequently underwent image guided liver lesion biopsy to confirm metastatic pancreatic cancer as requested by oncology. - LFTs started to slowly trend down by day of discharge - Patient cleared by GI for discharge with outpatient follow-up with primary oncologist with serial LFTs. - Patient was discharged in stable condition with outpatient follow-up with his primary oncologist, Dr. Cloretta as previously scheduled.  2.  Hypertension -Patient maintained on home regimen amlodipine .  3.  Hypothyroidism -Maintained on home regimen Synthroid .  4.  GERD -Patient maintained on PPI.  5.  Hyperglycemia without history of diabetes - Hemoglobin A1c at 6.1. - Outpatient follow-up with PCP.  Procedures: CT abdomen and pelvis 03/24/2024 Liver mass biopsy per IR: Dr.Mugweru 03/26/2024 ERCP with biliary sphincterotomy and self-expanding metal stent placement per GI: Dr. Abran 03/25/2024  Consultations: Interventional radiology Oncology: Dr. Cloretta Gastroenterology: Dr. Abran 03/24/2024  Discharge Exam: Vitals:   03/26/24 1303 03/26/24 1315  BP: 126/85 128/81  Pulse: 61 61  Resp: 18 18  Temp: 98.1 F (36.7 C) 97.8 F (36.6 C)  SpO2: 99% 100%    General: NAD Cardiovascular: RRR no murmurs rubs or gallops.  No JVD.  No lower extremity edema Respiratory: Clear to auscultation bilaterally anterior lung fields.  No wheezes, no crackles, no rhonchi.  Fair  air movement.  Speaking in full sentences.  Discharge Instructions   Discharge Instructions     Diet general   Complete by: As directed    Increase activity slowly   Complete by: As directed    No wound care   Complete by: As directed       Allergies as of 03/26/2024   No Known Allergies      Medication List     TAKE these medications    amLODipine  10 MG tablet Commonly known as: NORVASC  Take 1 tablet (10 mg total) by mouth daily.   HYDROmorphone  2 MG tablet Commonly known as: Dilaudid  Take 0.5 tablets (1 mg total) by mouth every 6 (six) hours as needed for severe pain (pain score 7-10).   levothyroxine  125 MCG tablet Commonly known as: SYNTHROID  TAKE 1 TABLET BY MOUTH 30 MINUTES BEFORE BREAKFAST (STOP DOSE) What changed: See the new instructions.   lidocaine -prilocaine cream Commonly known as: EMLA Apply 1 Application topically as needed. Apply 1 Application topically as needed (Apply 1 hours prior to appt)   omeprazole 40 MG capsule Commonly known as: PRILOSEC Take 40 mg by mouth daily.   ondansetron  4 MG tablet Commonly known as: Zofran  Take 1 tablet (4 mg total) by mouth every 8 (eight) hours as needed for nausea or vomiting.   prochlorperazine 10 MG tablet Commonly known as: COMPAZINE Take 1 tablet (10 mg total) by mouth every 6 (six) hours as needed for nausea or vomiting.       No Known Allergies  Follow-up Information     Cloretta Arley NOVAK, MD Follow up.   Specialty: Oncology Why: Follow-up as scheduled. Contact information: [address] [place] [address] [phone]         [facility]. Schedule an appointment as soon as possible for a visit in 3 week(s).   Specialty: Family Medicine Contact information: [address] [place] [address] [phone]                  The results of significant diagnostics from this hospitalization (including imaging, microbiology,  ancillary and laboratory) are listed below for reference.    Significant Diagnostic Studies: US  BIOPSY (LIVER) Result Date: 03/26/2024 INDICATION: [id number] Liver mass [id number].  History of pancreatic cancer. EXAM: ULTRASOUND GUIDED LIVER MASS BIOPSY COMPARISON:  CT AP, 03/24/2024.  US  Abdomen, 03/09/2024. MEDICATIONS: None ANESTHESIA/SEDATION: Moderate (conscious) sedation was employed during this procedure. A total of Versed  2 mg and Fentanyl  100 mcg was administered intravenously. Moderate Sedation Time: 10 minutes. The patient's level of consciousness and vital signs were monitored continuously by radiology nursing throughout the procedure under my direct supervision. COMPLICATIONS: None immediate. PROCEDURE: Informed written consent was obtained from the patient and/or patient's representative after a discussion of the risks, benefits and alternatives to treatment. The patient understands and consents the procedure. A timeout was performed prior to the initiation of the procedure. Ultrasound scanning was performed of the right upper abdominal quadrant demonstrates multifocal hepatic masses The RIGHT hepatic lobe was selected for biopsy and the procedure was planned. The right upper abdominal quadrant was prepped and draped in the usual sterile fashion. The overlying soft tissues were anesthetized with 1% lidocaine . A 17 gauge co-axial needle was advanced into a peripheral aspect of the lesion. This was followed by 4 core biopsies with an 18 gauge core device under direct ultrasound guidance. The needle was removed, then superficial hemostasis was obtained with manual compression. Post procedural scanning was negative for definitive  area of hemorrhage or additional complication. A dressing was placed. The patient tolerated the procedure well without immediate post procedural complication. IMPRESSION: Successful ultrasound-guided core needle biopsy of a liver mass. Thom Hall, MD [facility] Radiology Electronically Signed   By: Thom Hall M.D.   On: 03/26/2024 14:05   DG ERCP Result Date: 03/26/2024 CLINICAL DATA:  [id number] Surgery, elective [id number] EXAM: ERCP COMPARISON:  CT AP, 03/26/2024 and 03/09/2024. FLUOROSCOPY: Exposure Index (as provided by the fluoroscopic device): 215 mGy Kerma FINDINGS: Limited oblique planar images of the RIGHT upper quadrant obtained C-arm. Images demonstrating flexible endoscopy, biliary duct cannulation, retrograde cholangiogram and metallic biliary stent placement. No biliary ductal dilation. No evidence of biliary filling defect is demonstrated. IMPRESSION: Fluoroscopic imaging for ERCP and metallic biliary stent placement. For complete description of intra procedural findings, please see performing service dictation. Electronically Signed   By: Thom Hall M.D.   On: 03/26/2024 09:34   DG C-Arm 1-60 Min-No Report Result Date: 03/25/2024 Fluoroscopy was utilized by the requesting physician.  No radiographic interpretation.   DG C-Arm 1-60 Min-No Report Result Date: 03/25/2024 Fluoroscopy was utilized by the requesting physician.  No radiographic interpretation.   CT ABDOMEN PELVIS W CONTRAST Result Date: 03/24/2024 CLINICAL DATA:  Elevated bilirubin. On chemotherapy for pancreatic carcinoma. * Tracking Code: BO * EXAM: CT ABDOMEN AND PELVIS WITH CONTRAST TECHNIQUE: Multidetector CT imaging of the abdomen and pelvis was performed using the standard protocol following bolus administration of intravenous contrast. RADIATION DOSE REDUCTION: This exam was performed according to the departmental dose-optimization program which includes automated exposure control, adjustment of the mA and/or kV according to patient size and/or use of iterative reconstruction technique. CONTRAST:  OMNIPAQUE  IOHEXOL  300 MG/ML  SOLN COMPARISON:  03/09/2024 FINDINGS: Lower chest: Clear lung bases. Normal heart size without pericardial or pleural  effusion. Hepatobiliary: Bilobar hepatic metastasis. Index segment 3 mass measures 3.8 x 3.3 cm in 24/2 versus 3.2 x 3.4 cm when measured in a similar fashion on the prior. Index subcapsular right hepatic lobe lesion measures 2.0 cm on image 31/2 versus 2.3 cm when remeasured in a similar fashion on the prior. Index subcapsular right hepatic lobe 1.6 cm lesion on 20/2 measured 1.4 cm on the prior. Intrahepatic biliary duct dilatation is mild and minimally increased. Common duct measures 12 mm on coronal image 58 versus 9 mm on the prior exam when remeasured in similar fashion. Common duct is obstructed by the pancreatic head mass detailed below. Subtle stone or stones in the gallbladder fundus. Pancreas: Pancreatic head/uncinate process mass measures 2.7 x 3.0 cm on 31/2 and is felt to be similar to on the prior (when remeasured). The upstream pancreatic duct dilatation and atrophy are moderate and similar. No evidence of superimposed pancreatitis. Spleen: Normal in size, without focal abnormality. Adrenals/Urinary Tract: Normal adrenal glands. Left renal collecting system calculi up to 1.0 cm Low-density left renal lesions up to 1.5 cm are likely cysts and do not warrant imaging follow-up. No hydronephrosis.  Normal urinary bladder. Stomach/Bowel: Normal stomach, without wall thickening. Normal colon and terminal ileum. Normal small bowel. Vascular/Lymphatic: Aortic atherosclerosis. Splenoportal confluence and SMV narrowing by tumor. No arterial encasement. No abdominopelvic adenopathy. Reproductive: Normal prostate. Other: No significant free fluid.  No free intraperitoneal air. Musculoskeletal: Lumbar spondylosis. IMPRESSION: 1. The pancreatic head/uncinate process primary and liver metastasis are relatively similar. 2. Increase in mild to moderate biliary duct dilatation. 3. Incidental findings, including: Left nephrolithiasis. Cholelithiasis. Case discussed  with Dr. Cloretta  at 1:10 p.m. Electronically Signed    By: Rockey Kilts M.D.   On: 03/24/2024 13:22   CT Abdomen Pelvis W Contrast Result Date: 03/18/2024 CLINICAL DATA:  Liver mass on right upper quadrant ultrasound, chest pain. * Tracking Code: BO * EXAM: CT CHEST WITH CONTRAST CT ABDOMEN WITHOUT AND WITH CONTRAST TECHNIQUE: Multidetector CT imaging of the abdomen was performed without intravenous contrast. Multidetector CT imaging of the chest and abdomen was then performed during bolus administration of intravenous contrast. RADIATION DOSE REDUCTION: This exam was performed according to the departmental dose-optimization program which includes automated exposure control, adjustment of the mA and/or kV according to patient size and/or use of iterative reconstruction technique. CONTRAST:  75mL OMNIPAQUE  IOHEXOL  350 MG/ML SOLN COMPARISON:  Ultrasound abdomen from earlier the same day. FINDINGS: CT CHEST FINDINGS Cardiovascular: Normal cardiac size. No pericardial effusion. No aortic aneurysm. There are coronary artery calcifications, in keeping with coronary artery disease. Mediastinum/Nodes: Visualized thyroid  gland appears grossly unremarkable. No solid / cystic mediastinal masses. The esophagus is nondistended precluding optimal assessment. No axillary, mediastinal or hilar lymphadenopathy by size criteria. Lungs/Pleura: The central tracheo-bronchial tree is patent. There are dependent changes in bilateral lungs. No mass or consolidation. No pleural effusion or pneumothorax. No suspicious lung nodules. Musculoskeletal: The visualized soft tissues of the chest wall are grossly unremarkable. No suspicious osseous lesions. CT ABDOMEN PELVIS FINDINGS Hepatobiliary: The liver is normal in size. There is liver surface irregularity/nodularity, suggesting cirrhosis cirrhotic appearance due to underlying liver lesions described as follows. There are multiple (more than 15), ill-defined hypoattenuating masses throughout the liver with largest in the left hepatic lobe,  segment 4 B/3 junction region measuring up to 3.0 x 3.2 cm. No intrahepatic or extrahepatic bile duct dilation. No calcified gallstones. Normal gallbladder wall thickness. No pericholecystic inflammatory changes. Pancreas: There is an ill-defined, hypoenhancing, approximately 1.6 x 1.9 cm lesion in the pancreatic head, which is highly concerning for pancreatic malignancy. There is resultant moderate upstream main pancreatic duct dilation, measuring up to 8 mm in the neck region. No peripancreatic fat stranding. There is resultant up to moderate narrowing of the portal vein at the level of confluence of SMV/splenic veins (series 7, image 66). Rest of the main portal vein and its tributaries are otherwise normal in caliber and patent. Unremarkable hepatic veins. There is subtle fat stranding surrounding the pancreatic head/uncinate process and neck region, which is nonspecific but can be seen with acute interstitial pancreatitis. Correlate clinically and with serum lipase levels. Spleen: Within normal limits. No focal lesion. Adrenals/Urinary Tract: There are bilateral adrenal adenomas. On the left it measures up to 1.0 x 1.6 cm and on the right it measures up to 0.9 x 1.2 cm. No suspicious renal mass. There is a 1.3 x 1.6 cm simple cyst arising from the left kidney upper pole. There is a 6 x 10 mm nonobstructing calculus in the left kidney upper pole and an additional 2 mm nonobstructing calculus in the left kidney lower pole. No other nephroureterolithiasis on either side. No obstructive uropathy on either side. Urinary bladder is under distended, precluding optimal assessment. However, no large mass or stones identified. No perivesical fat stranding. Stomach/Bowel: No disproportionate dilation of the small or large bowel loops. No evidence of abnormal bowel wall thickening. There is subtle fat stranding surrounding the proximal duodenum, which is nonspecific but typically seen as sequela of pancreatitis or  duodenitis. Correlate clinically. The appendix is unremarkable. Vascular/Lymphatic: No ascites or pneumoperitoneum.  No abdominal or pelvic lymphadenopathy, by size criteria. No aneurysmal dilation of the major abdominal arteries. Reproductive: Normal size prostate. Symmetric seminal vesicles. Other: There is a tiny fat containing umbilical hernia. There are bilateral small fat containing inguinal hernias. Musculoskeletal: No suspicious osseous lesions. There are mild multilevel degenerative changes in the visualized spine. IMPRESSION: 1. There is an ill-defined, hypoenhancing, approximately 1.6 x 1.9 cm lesion in the pancreatic head, which is highly concerning for pancreatic malignancy. There is resultant moderate upstream main pancreatic duct dilation, measuring up to 8 mm in the neck region. There is also resultant up to moderate narrowing of the portal vein at the level of confluence of SMV/splenic veins. 2. There are multiple (more than 15), ill-defined hypoattenuating masses throughout the liver with largest in the left hepatic lobe, segment 4B/3 junction region measuring up to 3.0 x 3.2 cm. These are compatible with metastases. 3. There is subtle fat stranding surrounding the pancreatic head/uncinate process and neck region, which is nonspecific but can be seen with acute interstitial pancreatitis. There is subtle fat stranding surrounding the proximal duodenum, which is typically seen as sequela of pancreatitis or duodenitis. 4. No metastatic disease identified within the chest. 5. Multiple other nonacute observations, as described above. Aortic Atherosclerosis (ICD10-I70.0). Electronically Signed   By: Ree Molt M.D.   On: 03/18/2024 11:35   IR IMAGING GUIDED PORT INSERTION Result Date: 03/17/2024 CLINICAL DATA:  Pancreatic adenocarcinoma. Chest port placement for therapy. EXAM: Chest port catheter placement TECHNIQUE: Procedure performed using fluoroscopy and ultrasound CONTRAST:  None  RADIOPHARMACEUTICALS:  None FLUOROSCOPY: Three mGy COMPARISON:  None FINDINGS: The patient was placed in supine position on the IR gantry and the left upper chest and neck were prepped and draped in the usual sterile fashion. The nurse administered intravenous fentanyl  and Versed  under my supervision and the nurse had no other injuries other than monitoring the patient and administering medications. I was present for the entire duration of procedure. 2 mg intravenous Versed , 100 mcg intravenous fentanyl , 25 mg Benadryl  administered for a total sedation time of 24 minutes Ultrasound guidance was used to investigate the left internal jugular vein which was anechoic and compressible indicating patency. The needle was then advanced from a skin incision through the soft tissue into the left internal jugular vein under ultrasound guidance. A final image was obtained and stored in the patient's permanent medical record. Access was then exchanged over a guidewire which was advanced under fluoroscopic guidance. The needle was removed and replaced with a micropuncture sheath. Approximately 2 inches below the clavicle the port pocket was created with a subsequent incision. The catheter was then tunneled from the port pocket to the venotomy site overlying the left internal jugular vein. Access was then exchanged over an 035 guidewire for peel-away sheath which was advanced over the guidewire under fluoroscopic guidance. The catheter was then advanced through the peel-away sheath to the cavoatrial junction. Sheath was removed. The catheter was then cut at the port pocket and connected to chest port. The chest port was tested for function and finally function well. The chest port was then flushed with heparin and a port pocket was closed with 4-0 suture. Final image was obtained demonstrating satisfactory position of chest port. The final count of all materials was satisfactory. IMPRESSION: 1. Satisfactory placement of left  internal jugular vein single-lumen chest port. 2.  Okay to use and power inject chest port. Electronically Signed   By: Cordella Banner   On: 03/17/2024 15:51  US  BIOPSY (LIVER) Result Date: 03/11/2024 INDICATION: 52 year old male with history of multifocal liver masses suspicious for metastasis. EXAM: ULTRASOUND GUIDED LIVER LESION BIOPSY COMPARISON:  None Available. MEDICATIONS: None ANESTHESIA/SEDATION: Fentanyl  100 mcg IV; Versed  2 mg IV Total Moderate Sedation time:  17 minutes. The patient's level of consciousness and vital signs were monitored continuously by radiology nursing throughout the procedure under my direct supervision. COMPLICATIONS: None immediate. PROCEDURE: Informed written consent was obtained from the patient after a discussion of the risks, benefits and alternatives to treatment. The patient understands and consents the procedure. A timeout was performed prior to the initiation of the procedure. Ultrasound scanning was performed of the right upper abdominal quadrant demonstrates ill-defined hypoechoic mass in in the left lobe of the liver. The left lobe liver mass was selected for biopsy and the procedure was planned. The right upper abdominal quadrant was prepped and draped in the usual sterile fashion. The overlying soft tissues were anesthetized with 1% lidocaine  with epinephrine . A 17 gauge, 6.8 cm co-axial needle was advanced into a peripheral aspect of the lesion. This was followed by 3 core biopsies with an 18 gauge core device under direct ultrasound guidance. The coaxial needle tract was embolized with a small amount of Gel-Foam slurry and superficial hemostasis was obtained with manual compression. Post procedural scanning was negative for definitive area of hemorrhage or additional complication. A dressing was placed. The patient tolerated the procedure well without immediate post procedural complication. IMPRESSION: Technically successful ultrasound guided core needle biopsy  of left lobe liver mass. Ester Sides, MD [facility] Radiology Electronically Signed   By: Ester Sides M.D.   On: 03/11/2024 16:20   CT ABDOMEN PELVIS W WO CONTRAST Result Date: 03/09/2024 CLINICAL DATA:  Liver mass on right upper quadrant ultrasound, chest pain. * Tracking Code: BO * EXAM: CT CHEST WITH CONTRAST CT ABDOMEN WITHOUT AND WITH CONTRAST TECHNIQUE: Multidetector CT imaging of the abdomen was performed without intravenous contrast. Multidetector CT imaging of the chest and abdomen was then performed during bolus administration of intravenous contrast. RADIATION DOSE REDUCTION: This exam was performed according to the departmental dose-optimization program which includes automated exposure control, adjustment of the mA and/or kV according to patient size and/or use of iterative reconstruction technique. CONTRAST:  75mL OMNIPAQUE  IOHEXOL  350 MG/ML SOLN COMPARISON:  Ultrasound abdomen from earlier the same day. FINDINGS: CT CHEST FINDINGS Cardiovascular: Normal cardiac size. No pericardial effusion. No aortic aneurysm. There are coronary artery calcifications, in keeping with coronary artery disease. Mediastinum/Nodes: Visualized thyroid  gland appears grossly unremarkable. No solid / cystic mediastinal masses. The esophagus is nondistended precluding optimal assessment. No axillary, mediastinal or hilar lymphadenopathy by size criteria. Lungs/Pleura: The central tracheo-bronchial tree is patent. There are dependent changes in bilateral lungs. No mass or consolidation. No pleural effusion or pneumothorax. No suspicious lung nodules. Musculoskeletal: The visualized soft tissues of the chest wall are grossly unremarkable. No suspicious osseous lesions. CT ABDOMEN PELVIS FINDINGS Hepatobiliary: The liver is normal in size. There is liver surface irregularity/nodularity, suggesting cirrhosis cirrhotic appearance due to underlying liver lesions described as  follows. There are multiple (more than 15), ill-defined hypoattenuating masses throughout the liver with largest in the left hepatic lobe, segment 4 B/3 junction region measuring up to 3.0 x 3.2 cm. No intrahepatic or extrahepatic bile duct dilation. No calcified gallstones. Normal gallbladder wall thickness. No pericholecystic inflammatory changes. Pancreas: There is an ill-defined, hypoenhancing, approximately 1.6 x 1.9 cm lesion in the pancreatic head, which  is highly concerning for pancreatic malignancy. There is resultant moderate upstream main pancreatic duct dilation, measuring up to 8 mm in the neck region. No peripancreatic fat stranding. There is resultant up to moderate narrowing of the portal vein at the level of confluence of SMV/splenic veins (series 7, image 66). Rest of the main portal vein and its tributaries are otherwise normal in caliber and patent. Unremarkable hepatic veins. There is subtle fat stranding surrounding the pancreatic head/uncinate process and neck region, which is nonspecific but can be seen with acute interstitial pancreatitis. Correlate clinically and with serum lipase levels. Spleen: Within normal limits. No focal lesion. Adrenals/Urinary Tract: There are bilateral adrenal adenomas. On the left it measures up to 1.0 x 1.6 cm and on the right it measures up to 0.9 x 1.2 cm. No suspicious renal mass. There is a 1.3 x 1.6 cm simple cyst arising from the left kidney upper pole. There is a 6 x 10 mm nonobstructing calculus in the left kidney upper pole and an additional 2 mm nonobstructing calculus in the left kidney lower pole. No other nephroureterolithiasis on either side. No obstructive uropathy on either side. Urinary bladder is under distended, precluding optimal assessment. However, no large mass or stones identified. No perivesical fat stranding. Stomach/Bowel: No disproportionate dilation of the small or large bowel loops. No evidence of abnormal bowel wall thickening.  There is subtle fat stranding surrounding the proximal duodenum, which is nonspecific but typically seen as sequela of pancreatitis or duodenitis. Correlate clinically. The appendix is unremarkable. Vascular/Lymphatic: No ascites or pneumoperitoneum. No abdominal or pelvic lymphadenopathy, by size criteria. No aneurysmal dilation of the major abdominal arteries. Reproductive: Normal size prostate. Symmetric seminal vesicles. Other: There is a tiny fat containing umbilical hernia. There are bilateral small fat containing inguinal hernias. Musculoskeletal: No suspicious osseous lesions. There are mild multilevel degenerative changes in the visualized spine. IMPRESSION: 1. There is an ill-defined, hypoenhancing, approximately 1.6 x 1.9 cm lesion in the pancreatic head, which is highly concerning for pancreatic malignancy. There is resultant moderate upstream main pancreatic duct dilation, measuring up to 8 mm in the neck region. There is also resultant up to moderate narrowing of the portal vein at the level of confluence of SMV/splenic veins. 2. There are multiple (more than 15), ill-defined hypoattenuating masses throughout the liver with largest in the left hepatic lobe, segment 4B/3 junction region measuring up to 3.0 x 3.2 cm. These are compatible with metastases. 3. There is subtle fat stranding surrounding the pancreatic head/uncinate process and neck region, which is nonspecific but can be seen with acute interstitial pancreatitis. There is subtle fat stranding surrounding the proximal duodenum, which is typically seen as sequela of pancreatitis or duodenitis. 4. No metastatic disease identified within the chest. 5. Multiple other nonacute observations, as described above. Aortic Atherosclerosis (ICD10-I70.0). Electronically Signed   By: Ree Molt M.D.   On: 03/09/2024 10:17   CT CHEST W CONTRAST Result Date: 03/09/2024 CLINICAL DATA:  Liver mass on right upper quadrant ultrasound, chest pain. *  Tracking Code: BO * EXAM: CT CHEST WITH CONTRAST CT ABDOMEN WITHOUT AND WITH CONTRAST TECHNIQUE: Multidetector CT imaging of the abdomen was performed without intravenous contrast. Multidetector CT imaging of the chest and abdomen was then performed during bolus administration of intravenous contrast. RADIATION DOSE REDUCTION: This exam was performed according to the departmental dose-optimization program which includes automated exposure control, adjustment of the mA and/or kV according to patient size and/or use of iterative reconstruction technique. CONTRAST:  75mL OMNIPAQUE  IOHEXOL  350 MG/ML SOLN COMPARISON:  Ultrasound abdomen from earlier the same day. FINDINGS: CT CHEST FINDINGS Cardiovascular: Normal cardiac size. No pericardial effusion. No aortic aneurysm. There are coronary artery calcifications, in keeping with coronary artery disease. Mediastinum/Nodes: Visualized thyroid  gland appears grossly unremarkable. No solid / cystic mediastinal masses. The esophagus is nondistended precluding optimal assessment. No axillary, mediastinal or hilar lymphadenopathy by size criteria. Lungs/Pleura: The central tracheo-bronchial tree is patent. There are dependent changes in bilateral lungs. No mass or consolidation. No pleural effusion or pneumothorax. No suspicious lung nodules. Musculoskeletal: The visualized soft tissues of the chest wall are grossly unremarkable. No suspicious osseous lesions. CT ABDOMEN PELVIS FINDINGS Hepatobiliary: The liver is normal in size. There is liver surface irregularity/nodularity, suggesting cirrhosis cirrhotic appearance due to underlying liver lesions described as follows. There are multiple (more than 15), ill-defined hypoattenuating masses throughout the liver with largest in the left hepatic lobe, segment 4 B/3 junction region measuring up to 3.0 x 3.2 cm. No intrahepatic or extrahepatic bile duct dilation. No calcified gallstones. Normal gallbladder wall thickness. No  pericholecystic inflammatory changes. Pancreas: There is an ill-defined, hypoenhancing, approximately 1.6 x 1.9 cm lesion in the pancreatic head, which is highly concerning for pancreatic malignancy. There is resultant moderate upstream main pancreatic duct dilation, measuring up to 8 mm in the neck region. No peripancreatic fat stranding. There is resultant up to moderate narrowing of the portal vein at the level of confluence of SMV/splenic veins (series 7, image 66). Rest of the main portal vein and its tributaries are otherwise normal in caliber and patent. Unremarkable hepatic veins. There is subtle fat stranding surrounding the pancreatic head/uncinate process and neck region, which is nonspecific but can be seen with acute interstitial pancreatitis. Correlate clinically and with serum lipase levels. Spleen: Within normal limits. No focal lesion. Adrenals/Urinary Tract: There are bilateral adrenal adenomas. On the left it measures up to 1.0 x 1.6 cm and on the right it measures up to 0.9 x 1.2 cm. No suspicious renal mass. There is a 1.3 x 1.6 cm simple cyst arising from the left kidney upper pole. There is a 6 x 10 mm nonobstructing calculus in the left kidney upper pole and an additional 2 mm nonobstructing calculus in the left kidney lower pole. No other nephroureterolithiasis on either side. No obstructive uropathy on either side. Urinary bladder is under distended, precluding optimal assessment. However, no large mass or stones identified. No perivesical fat stranding. Stomach/Bowel: No disproportionate dilation of the small or large bowel loops. No evidence of abnormal bowel wall thickening. There is subtle fat stranding surrounding the proximal duodenum, which is nonspecific but typically seen as sequela of pancreatitis or duodenitis. Correlate clinically. The appendix is unremarkable. Vascular/Lymphatic: No ascites or pneumoperitoneum. No abdominal or pelvic lymphadenopathy, by size criteria. No  aneurysmal dilation of the major abdominal arteries. Reproductive: Normal size prostate. Symmetric seminal vesicles. Other: There is a tiny fat containing umbilical hernia. There are bilateral small fat containing inguinal hernias. Musculoskeletal: No suspicious osseous lesions. There are mild multilevel degenerative changes in the visualized spine. IMPRESSION: 1. There is an ill-defined, hypoenhancing, approximately 1.6 x 1.9 cm lesion in the pancreatic head, which is highly concerning for pancreatic malignancy. There is resultant moderate upstream main pancreatic duct dilation, measuring up to 8 mm in the neck region. There is also resultant up to moderate narrowing of the portal vein at the level of confluence of SMV/splenic veins. 2. There are multiple (more than 15), ill-defined  hypoattenuating masses throughout the liver with largest in the left hepatic lobe, segment 4B/3 junction region measuring up to 3.0 x 3.2 cm. These are compatible with metastases. 3. There is subtle fat stranding surrounding the pancreatic head/uncinate process and neck region, which is nonspecific but can be seen with acute interstitial pancreatitis. There is subtle fat stranding surrounding the proximal duodenum, which is typically seen as sequela of pancreatitis or duodenitis. 4. No metastatic disease identified within the chest. 5. Multiple other nonacute observations, as described above. Aortic Atherosclerosis (ICD10-I70.0). Electronically Signed   By: Ree Molt M.D.   On: 03/09/2024 10:17   US  Abdomen Limited RUQ (LIVER/GB) Result Date: 03/09/2024 CLINICAL DATA:  [id number].  Elevated liver enzymes. EXAM: ULTRASOUND ABDOMEN LIMITED RIGHT UPPER QUADRANT COMPARISON:  Ultrasound complete abdomen from 12/23/2018. FINDINGS: Gallbladder: No gallstones or wall thickening visualized. The gallbladder is mildly dilated measuring 11 cm in length. There is a small amount of hypoechoic layering sludge. No sonographic Murphy sign noted by  sonographer. Common bile duct: Diameter: Measures prominent at 7.4 mm mild intrahepatic biliary prominence. Liver: Not seen in 2020, there is a heterogeneously hypoechoic solid mass with scattered color flow in the left lobe measuring 3.5 x 2.9 x 3.3 cm, worrisome for primary or metastatic neoplasm. CT or MRI recommended preferably without and with contrast. Otherwise, the liver is within normal limits in parenchymal echogenicity. Portal vein is patent on color Doppler imaging with normal direction of blood flow towards the liver. Other: No right upper quadrant ascites. IMPRESSION: 1. 3.5 x 2.9 x 3.3 cm heterogeneously hypoechoic solid mass in the left lobe of the liver, worrisome for primary or metastatic neoplasm. CT or MRI recommended preferably without and with contrast. 2. Mildly dilated gallbladder with a small amount of sludge. No gallstones or wall thickening. 3. Prominent common bile duct at 7.4 mm with mild intrahepatic biliary prominence. Electronically Signed   By: Francis Quam M.D.   On: 03/09/2024 07:46   DG Ribs Unilateral W/Chest Left Result Date: 03/09/2024 CLINICAL DATA:  53 year old male with back and rib pain for 1 month. EXAM: LEFT RIBS AND CHEST - 3+ VIEW COMPARISON:  None Available. FINDINGS: PA view of the chest 0457 hours. Low normal lung volumes. Normal cardiac size and mediastinal contours. Visualized tracheal air column is within normal limits. Both lungs appear clear. No pneumothorax or pleural effusion. Four oblique views of the left ribs. Bone mineralization is within normal limits. 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 EKG leads project over the left chest. No left rib fracture or rib lesion identified. Other visible osseous structures appear intact. Negative visible bowel gas pattern. However, left abdominal oval, crescent-shaped 13 mm calculus is visible on multiple views, suspicious for left nephrolithiasis. IMPRESSION: 1. No acute cardiopulmonary abnormality.  Negative left  radiographs. 2. Evidence of bulky 13 mm left renal calculus.  Query renal colic. Electronically Signed   By: VEAR Hurst M.D.   On: 03/09/2024 05:32    Microbiology: No results found for this or any previous visit (from the past 240 hours).   Labs: Basic Metabolic Panel: Recent Labs  Lab 03/24/24 0845 03/24/24 1852 03/25/24 0530 03/26/24 1330  NA 137  --  139 138  K 3.7  --  3.9 3.9  CL 101  --  104 102  CO2 27  --  25 25  GLUCOSE 139*  --  102* 109*  BUN 13  --  14 16  CREATININE 1.19 1.01 1.05 1.25*  CALCIUM 10.2  --  9.4 9.5  MG  --   --   --  1.9   Liver Function Tests: Recent Labs  Lab 03/24/24 0845 03/25/24 0530 03/26/24 1330  AST 296* 246* 178*  ALT 689* 623* 497*  ALKPHOS 367* 319* 305*  BILITOT 11.3*  11.3* 11.3* 11.1*  PROT 7.8 6.9 6.9  ALBUMIN 4.2 3.9 3.6   No results for input(s): LIPASE, AMYLASE in the last 168 hours. No results for input(s): AMMONIA in the last 168 hours. CBC: Recent Labs  Lab 03/24/24 0845 03/24/24 1852 03/25/24 0530 03/26/24 1330  WBC 4.9 4.4 4.8 5.9  NEUTROABS 3.6  --   --  4.4  HGB 10.8* 10.6* 10.3* 10.2*  HCT 32.4* 32.7* 32.6* 31.7*  MCV 60.9* 61.1* 61.0* 60.5*  PLT 216 211 209 212   Cardiac Enzymes: No results for input(s): CKTOTAL, CKMB, CKMBINDEX, TROPONINI in the last 168 hours. BNP: BNP (last 3 results) No results for input(s): BNP in the last 8760 hours.  ProBNP (last 3 results) No results for input(s): PROBNP in the last 8760 hours.  CBG: Recent Labs  Lab 03/25/24 2140  GLUCAP 268*       Signed:  Toribio Hummer MD.  [facility] 03/26/2024, 4:18 PM

## 2024-03-27 ENCOUNTER — Other Ambulatory Visit: Payer: Self-pay

## 2024-03-27 ENCOUNTER — Inpatient Hospital Stay (HOSPITAL_COMMUNITY)

## 2024-03-27 ENCOUNTER — Inpatient Hospital Stay (HOSPITAL_COMMUNITY)
Admission: EM | Admit: 2024-03-27 | Discharge: 2024-03-30 | DRG: 919 | Disposition: A | Discharge status: Home / Self Care | Attending: Internal Medicine | Admitting: Internal Medicine

## 2024-03-27 ENCOUNTER — Encounter (HOSPITAL_COMMUNITY): Payer: Self-pay | Admitting: Emergency Medicine

## 2024-03-27 ENCOUNTER — Emergency Department (HOSPITAL_COMMUNITY)

## 2024-03-27 DIAGNOSIS — C787 Secondary malignant neoplasm of liver and intrahepatic bile duct: Secondary | ICD-10-CM | POA: Diagnosis present

## 2024-03-27 DIAGNOSIS — C250 Malignant neoplasm of head of pancreas: Secondary | ICD-10-CM

## 2024-03-27 DIAGNOSIS — R7881 Bacteremia: Secondary | ICD-10-CM | POA: Diagnosis present

## 2024-03-27 DIAGNOSIS — D509 Iron deficiency anemia, unspecified: Secondary | ICD-10-CM | POA: Diagnosis present

## 2024-03-27 DIAGNOSIS — Z59868 Other specified financial insecurity: Secondary | ICD-10-CM

## 2024-03-27 DIAGNOSIS — T85520A Displacement of bile duct prosthesis, initial encounter: Secondary | ICD-10-CM | POA: Diagnosis not present

## 2024-03-27 DIAGNOSIS — Z9689 Presence of other specified functional implants: Secondary | ICD-10-CM | POA: Diagnosis not present

## 2024-03-27 DIAGNOSIS — D563 Thalassemia minor: Secondary | ICD-10-CM | POA: Diagnosis present

## 2024-03-27 DIAGNOSIS — E039 Hypothyroidism, unspecified: Secondary | ICD-10-CM | POA: Diagnosis present

## 2024-03-27 DIAGNOSIS — R748 Abnormal levels of other serum enzymes: Secondary | ICD-10-CM | POA: Diagnosis present

## 2024-03-27 DIAGNOSIS — Z79890 Hormone replacement therapy (postmenopausal): Secondary | ICD-10-CM | POA: Diagnosis not present

## 2024-03-27 DIAGNOSIS — R7401 Elevation of levels of liver transaminase levels: Secondary | ICD-10-CM | POA: Diagnosis not present

## 2024-03-27 DIAGNOSIS — K760 Fatty (change of) liver, not elsewhere classified: Secondary | ICD-10-CM | POA: Diagnosis present

## 2024-03-27 DIAGNOSIS — Z8249 Family history of ischemic heart disease and other diseases of the circulatory system: Secondary | ICD-10-CM | POA: Diagnosis not present

## 2024-03-27 DIAGNOSIS — T85898A Other specified complication of other internal prosthetic devices, implants and grafts, initial encounter: Secondary | ICD-10-CM | POA: Diagnosis present

## 2024-03-27 DIAGNOSIS — Z79899 Other long term (current) drug therapy: Secondary | ICD-10-CM

## 2024-03-27 DIAGNOSIS — I10 Essential (primary) hypertension: Secondary | ICD-10-CM | POA: Diagnosis present

## 2024-03-27 DIAGNOSIS — K859 Acute pancreatitis without necrosis or infection, unspecified: Secondary | ICD-10-CM | POA: Diagnosis present

## 2024-03-27 DIAGNOSIS — C259 Malignant neoplasm of pancreas, unspecified: Secondary | ICD-10-CM | POA: Diagnosis not present

## 2024-03-27 DIAGNOSIS — K838 Other specified diseases of biliary tract: Secondary | ICD-10-CM | POA: Diagnosis not present

## 2024-03-27 DIAGNOSIS — H409 Unspecified glaucoma: Secondary | ICD-10-CM | POA: Diagnosis present

## 2024-03-27 DIAGNOSIS — K219 Gastro-esophageal reflux disease without esophagitis: Secondary | ICD-10-CM | POA: Diagnosis present

## 2024-03-27 DIAGNOSIS — Z800 Family history of malignant neoplasm of digestive organs: Secondary | ICD-10-CM

## 2024-03-27 DIAGNOSIS — K9189 Other postprocedural complications and disorders of digestive system: Secondary | ICD-10-CM

## 2024-03-27 DIAGNOSIS — R509 Fever, unspecified: Secondary | ICD-10-CM | POA: Diagnosis not present

## 2024-03-27 DIAGNOSIS — C801 Malignant (primary) neoplasm, unspecified: Secondary | ICD-10-CM | POA: Diagnosis present

## 2024-03-27 DIAGNOSIS — Z833 Family history of diabetes mellitus: Secondary | ICD-10-CM

## 2024-03-27 DIAGNOSIS — Z8616 Personal history of COVID-19: Secondary | ICD-10-CM

## 2024-03-27 DIAGNOSIS — R1010 Upper abdominal pain, unspecified: Principal | ICD-10-CM

## 2024-03-27 DIAGNOSIS — Y838 Other surgical procedures as the cause of abnormal reaction of the patient, or of later complication, without mention of misadventure at the time of the procedure: Secondary | ICD-10-CM | POA: Diagnosis present

## 2024-03-27 DIAGNOSIS — K831 Obstruction of bile duct: Secondary | ICD-10-CM | POA: Diagnosis present

## 2024-03-27 DIAGNOSIS — R7989 Other specified abnormal findings of blood chemistry: Secondary | ICD-10-CM | POA: Diagnosis present

## 2024-03-27 DIAGNOSIS — K8309 Other cholangitis: Secondary | ICD-10-CM | POA: Diagnosis present

## 2024-03-27 DIAGNOSIS — Z4659 Encounter for fitting and adjustment of other gastrointestinal appliance and device: Secondary | ICD-10-CM | POA: Diagnosis not present

## 2024-03-27 LAB — CBC WITH DIFFERENTIAL/PLATELET
Abs Immature Granulocytes: 0.02 K/uL (ref 0.00–0.07)
Basophils Absolute: 0 K/uL (ref 0.0–0.1)
Basophils Relative: 0 %
Eosinophils Absolute: 0 K/uL (ref 0.0–0.5)
Eosinophils Relative: 1 %
HCT: 34.6 % — ABNORMAL LOW (ref 39.0–52.0)
Hemoglobin: 11 g/dL — ABNORMAL LOW (ref 13.0–17.0)
Immature Granulocytes: 0 %
Lymphocytes Relative: 10 %
Lymphs Abs: 0.8 K/uL (ref 0.7–4.0)
MCH: 19.3 pg — ABNORMAL LOW (ref 26.0–34.0)
MCHC: 31.8 g/dL (ref 30.0–36.0)
MCV: 60.7 fL — ABNORMAL LOW (ref 80.0–100.0)
Monocytes Absolute: 0.6 K/uL (ref 0.1–1.0)
Monocytes Relative: 7 %
Neutro Abs: 6.5 K/uL (ref 1.7–7.7)
Neutrophils Relative %: 82 %
Platelets: 217 K/uL (ref 150–400)
RBC: 5.7 MIL/uL (ref 4.22–5.81)
RDW: 16.7 % — ABNORMAL HIGH (ref 11.5–15.5)
WBC: 7.9 K/uL (ref 4.0–10.5)
nRBC: 0 % (ref 0.0–0.2)

## 2024-03-27 LAB — URINALYSIS, ROUTINE W REFLEX MICROSCOPIC
Bacteria, UA: NONE SEEN
Glucose, UA: NEGATIVE mg/dL
Ketones, ur: NEGATIVE mg/dL
Leukocytes,Ua: NEGATIVE
Nitrite: NEGATIVE
Protein, ur: NEGATIVE mg/dL
Specific Gravity, Urine: 1.038 — ABNORMAL HIGH (ref 1.005–1.030)
pH: 5 (ref 5.0–8.0)

## 2024-03-27 LAB — COMPREHENSIVE METABOLIC PANEL WITH GFR
ALT: 495 U/L — ABNORMAL HIGH (ref 0–44)
AST: 164 U/L — ABNORMAL HIGH (ref 15–41)
Albumin: 3.9 g/dL (ref 3.5–5.0)
Alkaline Phosphatase: 316 U/L — ABNORMAL HIGH (ref 38–126)
Anion gap: 11 (ref 5–15)
BUN: 16 mg/dL (ref 6–20)
CO2: 26 mmol/L (ref 22–32)
Calcium: 9.4 mg/dL (ref 8.9–10.3)
Chloride: 100 mmol/L (ref 98–111)
Creatinine, Ser: 1.21 mg/dL (ref 0.61–1.24)
GFR, Estimated: >60 mL/min (ref >60)
Glucose, Bld: 164 mg/dL — ABNORMAL HIGH (ref 70–99)
Potassium: 3.6 mmol/L (ref 3.5–5.1)
Sodium: 137 mmol/L (ref 135–145)
Total Bilirubin: 12.3 mg/dL — ABNORMAL HIGH (ref 0.0–1.2)
Total Protein: 7 g/dL (ref 6.5–8.1)

## 2024-03-27 LAB — LIPASE, BLOOD: Lipase: 288 U/L — ABNORMAL HIGH (ref 11–51)

## 2024-03-27 LAB — SURGICAL PATHOLOGY

## 2024-03-27 MED ORDER — HYDROMORPHONE HCL 1 MG/ML IJ SOLN
1.0000 mg | Freq: Once | INTRAMUSCULAR | Status: AC
  Administered 2024-03-27: 1 mg via INTRAVENOUS
  Filled 2024-03-27: qty 1

## 2024-03-27 MED ORDER — INDOMETHACIN 50 MG RE SUPP: 100.0000 mg | Freq: Once | RECTAL | Status: DC

## 2024-03-27 MED ORDER — OXYCODONE HCL 5 MG PO TABS
10.0000 mg | ORAL_TABLET | ORAL | Status: DC | PRN
  Administered 2024-03-27 – 2024-03-28 (×2): 10 mg via ORAL
  Filled 2024-03-27 (×2): qty 2

## 2024-03-27 MED ORDER — SODIUM CHLORIDE 0.9 % IV SOLN: INTRAVENOUS | Status: DC

## 2024-03-27 MED ORDER — PANTOPRAZOLE SODIUM 40 MG PO TBEC
40.0000 mg | DELAYED_RELEASE_TABLET | Freq: Every day | ORAL | Status: DC
  Administered 2024-03-27 – 2024-03-30 (×4): 40 mg via ORAL
  Filled 2024-03-27 (×5): qty 1

## 2024-03-27 MED ORDER — LACTATED RINGERS IV BOLUS
1000.0000 mL | Freq: Once | INTRAVENOUS | Status: AC
  Administered 2024-03-27: 1000 mL via INTRAVENOUS

## 2024-03-27 MED ORDER — ONDANSETRON HCL 4 MG/2ML IJ SOLN
4.0000 mg | Freq: Four times a day (QID) | INTRAMUSCULAR | Status: DC | PRN
  Administered 2024-03-28: 4 mg via INTRAVENOUS

## 2024-03-27 MED ORDER — HYDROMORPHONE HCL 1 MG/ML IJ SOLN: 2.0000 mg | INTRAMUSCULAR | Status: DC | PRN

## 2024-03-27 MED ORDER — HYDROMORPHONE HCL 1 MG/ML IJ SOLN: 1.0000 mg | Freq: Once | INTRAMUSCULAR | Status: DC

## 2024-03-27 MED ORDER — HYDROMORPHONE HCL 1 MG/ML IJ SOLN
1.0000 mg | INTRAMUSCULAR | Status: DC | PRN
  Administered 2024-03-27 – 2024-03-28 (×3): 1 mg via INTRAVENOUS
  Filled 2024-03-27 (×4): qty 1

## 2024-03-27 MED ORDER — ONDANSETRON HCL 4 MG PO TABS: 4.0000 mg | ORAL_TABLET | Freq: Four times a day (QID) | ORAL | Status: DC | PRN

## 2024-03-27 MED ORDER — TRAZODONE HCL 50 MG PO TABS
50.0000 mg | ORAL_TABLET | Freq: Every evening | ORAL | Status: DC | PRN
  Administered 2024-03-27 – 2024-03-29 (×2): 50 mg via ORAL
  Filled 2024-03-27 (×2): qty 1

## 2024-03-27 MED ORDER — AMLODIPINE BESYLATE 10 MG PO TABS
10.0000 mg | ORAL_TABLET | Freq: Every day | ORAL | Status: DC
  Administered 2024-03-27 – 2024-03-30 (×4): 10 mg via ORAL
  Filled 2024-03-27 (×3): qty 1
  Filled 2024-03-27: qty 2

## 2024-03-27 MED ORDER — ENOXAPARIN SODIUM 40 MG/0.4ML IJ SOSY
40.0000 mg | PREFILLED_SYRINGE | INTRAMUSCULAR | Status: DC
Start: 2024-03-27 — End: 2024-03-30
  Administered 2024-03-27 – 2024-03-29 (×3): 40 mg via SUBCUTANEOUS
  Filled 2024-03-27 (×3): qty 0.4

## 2024-03-27 MED ORDER — ALBUTEROL SULFATE (2.5 MG/3ML) 0.083% IN NEBU: 2.5000 mg | INHALATION_SOLUTION | RESPIRATORY_TRACT | Status: DC | PRN

## 2024-03-27 MED ORDER — IBUPROFEN 200 MG PO TABS
400.0000 mg | ORAL_TABLET | Freq: Four times a day (QID) | ORAL | Status: DC | PRN
  Administered 2024-03-27 – 2024-03-28 (×2): 400 mg via ORAL
  Filled 2024-03-27 (×2): qty 2

## 2024-03-27 MED ORDER — SODIUM CHLORIDE 0.9 % IV SOLN
INTRAVENOUS | Status: DC
Start: 2024-03-27 — End: 2024-03-28

## 2024-03-27 MED ORDER — SODIUM CHLORIDE (PF) 0.9 % IJ SOLN
INTRAMUSCULAR | Status: AC
  Filled 2024-03-27: qty 50

## 2024-03-27 MED ORDER — LEVOTHYROXINE SODIUM 125 MCG PO TABS
125.0000 ug | ORAL_TABLET | Freq: Every day | ORAL | Status: DC
  Administered 2024-03-27 – 2024-03-30 (×4): 125 ug via ORAL
  Filled 2024-03-27 (×4): qty 1

## 2024-03-27 MED ORDER — ONDANSETRON HCL 4 MG/2ML IJ SOLN
4.0000 mg | Freq: Once | INTRAMUSCULAR | Status: AC
  Administered 2024-03-27: 4 mg via INTRAVENOUS
  Filled 2024-03-27: qty 2

## 2024-03-27 MED ORDER — IOHEXOL 300 MG/ML  SOLN
100.0000 mL | Freq: Once | INTRAMUSCULAR | Status: AC | PRN
  Administered 2024-03-27: 100 mL via INTRAVENOUS

## 2024-03-27 MED ORDER — PIPERACILLIN-TAZOBACTAM 3.375 G IVPB
3.3750 g | Freq: Three times a day (TID) | INTRAVENOUS | Status: DC
  Administered 2024-03-27 – 2024-03-30 (×10): 3.375 g via INTRAVENOUS
  Filled 2024-03-27 (×10): qty 50

## 2024-03-27 NOTE — Consult Note (Addendum)
 Referring Provider: EDP Primary Care Physician:  Medicine, [facility] Primary Gastroenterologist:  Dr. Abran  Reason for Consultation:  Post-ERCP pancreatitis  HPI: Douglas Harper is a 53 y.o. male with a new diagnosis of invasive moderately differentiated pancreatic adenocarcinoma with liver metastatic disease.  He presented with increasing jaundice/biliary obstruction and this was seen on CT imaging.  Underwent ERCP 10/7:  Malignant obstructive jaundice secondary to distal biliary stricture status post biliary sphincterotomy and self-expanding metal stent placement.  Discharged home 10/8, but presented back same day with complaints of abdominal pain.  Lipase slightly elevated and findings of acute pancreatitis on CT scan.  Also appears the SEMS may be in the cystic duct.  LFTs still elevated.  He tells me that he was feeling fine and pain started rather suddenly around 7 PM last night.  Some nausea, but no vomiting.  Pain reached 10 out of 10, currently about a 7 out of 10.  Lipase was 288.  Total bili 12.3, alk phos 316, ALT 495, AST 164.  Hemoglobin 11.0 g.  White blood cell count normal.  Platelets normal.  CT scan abdomen and pelvis with contrast 03/27/2024 IMPRESSION: 1. Interval placement of a palliative metallic internal biliary stent traversing the ampulla and extending into the cystic duct. This device does not clearly communicate with the common duct proximal to the obstructing mass, particularly when compared to prior examination where both ductal structures are better delineated. 2. Stable intra and extrahepatic biliary ductal dilation. 3. Stable bilobar hepatic metastatic disease. 4. Stable 4.7 cm hypoenhancing mass within the head of the pancreas. 5. Interval development of mild peripancreatic inflammatory stranding in keeping with changes of mild interstitial/edematous pancreatitis. No loculated peripancreatic fluid collections. No pancreatic or peripancreatic  necrosis identified. 6. Stable encasement and marked narrowing of the main portal vein at the portomesenteric confluence. This vessel remains patent. 7. Mild hepatic steatosis. 8. Minimal left nonobstructing nephrolithiasis.    Past Medical History:  Diagnosis Date   COVID-19    03/17/20   FH: heart disease    GERD (gastroesophageal reflux disease)    Glaucoma    Hypertension    Hyperthyroidism    s/p RAI 2009    Hypothyroidism    Verruca vulgaris     Past Surgical History:  Procedure Laterality Date   ANTERIOR CRUCIATE LIGAMENT REPAIR Right 2000   ERCP N/A 03/25/2024   Procedure: ERCP, WITH INTERVENTION IF INDICATED;  Surgeon: Abran Norleen SAILOR, MD;  Location: [facility];  Service: Gastroenterology;  Laterality: N/A;   HAND SURGERY Right 2001   with hardware implanted   IR IMAGING GUIDED PORT INSERTION  03/17/2024   NO PAST SURGERIES      Prior to Admission medications   Medication Sig Start Date End Date Taking? Authorizing Provider  amLODipine  (NORVASC ) 10 MG tablet Take 1 tablet (10 mg total) by mouth daily. 03/13/24   Darci Pore, MD  HYDROmorphone  (DILAUDID ) 2 MG tablet Take 0.5 tablets (1 mg total) by mouth every 6 (six) hours as needed for severe pain (pain score 7-10). 03/18/24   Debby Olam POUR, NP  levothyroxine  (SYNTHROID ) 125 MCG tablet TAKE 1 TABLET BY MOUTH 30 MINUTES BEFORE BREAKFAST (STOP DOSE) Patient taking differently: Take 125 mcg by mouth daily before breakfast. 12/14/23 12/13/24  Mercer Clotilda SAUNDERS, MD  lidocaine -prilocaine (EMLA) cream Apply 1 Application topically as needed. Apply 1 Application topically as needed (Apply 1 hours prior to appt) Patient not taking: No sig reported 03/20/24  Cloretta Arley NOVAK, MD  omeprazole (PRILOSEC) 40 MG capsule Take 40 mg by mouth daily. 02/29/24   [provider]  ondansetron  (ZOFRAN ) 4 MG tablet Take 1 tablet (4 mg total) by mouth every 8 (eight) hours as needed for nausea or vomiting. 03/20/24    Cloretta Arley NOVAK, MD  prochlorperazine (COMPAZINE) 10 MG tablet Take 1 tablet (10 mg total) by mouth every 6 (six) hours as needed for nausea or vomiting. 03/21/24   Cloretta Arley NOVAK, MD    Current Facility-Administered Medications  Medication Dose Route Frequency Provider Last Rate Last Admin   0.9 %  sodium chloride  infusion   Intravenous Continuous Zella, Mir M, MD 150 mL/hr at 03/27/24 0814 New Bag at 03/27/24 0814   albuterol  (PROVENTIL ) (2.5 MG/3ML) 0.083% nebulizer solution 2.5 mg  2.5 mg Nebulization Q2H PRN Zella, Mir M, MD       amLODipine  (NORVASC ) tablet 10 mg  10 mg Oral Daily Zella, Mir M, MD       enoxaparin (LOVENOX) injection 40 mg  40 mg Subcutaneous Q24H Zella, Mir M, MD       HYDROmorphone  (DILAUDID ) injection 1 mg  1 mg Intravenous Q2H PRN Zella, Mir M, MD       ibuprofen  (ADVIL ) tablet 400 mg  400 mg Oral Q6H PRN Zella, Mir M, MD       levothyroxine  (SYNTHROID ) tablet 125 mcg  125 mcg Oral QAC breakfast Zella, Mir M, MD   125 mcg at 03/27/24 9187   ondansetron  (ZOFRAN ) tablet 4 mg  4 mg Oral Q6H PRN Zella, Mir M, MD       Or   ondansetron  (ZOFRAN ) injection 4 mg  4 mg Intravenous Q6H PRN Zella, Mir M, MD       oxyCODONE  (Oxy IR/ROXICODONE ) immediate release tablet 10 mg  10 mg Oral Q4H PRN Zella, Mir M, MD       pantoprazole  (PROTONIX ) EC tablet 40 mg  40 mg Oral Daily Ikramullah, Mir M, MD       traZODone (DESYREL) tablet 50 mg  50 mg Oral QHS PRN Zella, Mir M, MD       Current Outpatient Medications  Medication Sig Dispense Refill   amLODipine  (NORVASC ) 10 MG tablet Take 1 tablet (10 mg total) by mouth daily. 30 tablet 1   HYDROmorphone  (DILAUDID ) 2 MG tablet Take 0.5 tablets (1 mg total) by mouth every 6 (six) hours as needed for severe pain (pain score 7-10). 30 tablet 0   levothyroxine  (SYNTHROID ) 125 MCG tablet TAKE 1 TABLET BY MOUTH 30 MINUTES BEFORE BREAKFAST (STOP DOSE) (Patient taking differently:  Take 125 mcg by mouth daily before breakfast.) 90 tablet 1   lidocaine -prilocaine (EMLA) cream Apply 1 Application topically as needed. Apply 1 Application topically as needed (Apply 1 hours prior to appt) (Patient not taking: No sig reported) 30 g 0   omeprazole (PRILOSEC) 40 MG capsule Take 40 mg by mouth daily.     ondansetron  (ZOFRAN ) 4 MG tablet Take 1 tablet (4 mg total) by mouth every 8 (eight) hours as needed for nausea or vomiting. 20 tablet 0   prochlorperazine (COMPAZINE) 10 MG tablet Take 1 tablet (10 mg total) by mouth every 6 (six) hours as needed for nausea or vomiting. 30 tablet 0    Allergies as of 03/27/2024   (No Known Allergies)    Family History  Problem Relation Age of Onset   Heart disease Mother    Hypertension Mother  Diabetes Mother    Colon cancer Neg Hx    Esophageal cancer Neg Hx    Pancreatic cancer Neg Hx    Liver disease Neg Hx    Rectal cancer Neg Hx    Stomach cancer Neg Hx     Social History   Socioeconomic History   Marital status: Single    Spouse name: Not on file   Number of children: Not on file   Years of education: Not on file   Highest education level: Some college, no degree  Occupational History   Not on file  Tobacco Use   Smoking status: Never   Smokeless tobacco: Never  Substance and Sexual Activity   Alcohol use: Yes    Comment: socially   Drug use: Never   Sexual activity: Not on file  Other Topics Concern   Not on file  Social History Narrative   Married wife Alexys Gassett DPR    From [place] been here since 53 y.o    Works Product/process development scientist [employer]    No tobacco, drugs, occas. etoh    8 siblings   Social Drivers of Corporate investment banker Strain: Medium Risk (04/25/2022)   Overall Financial Resource Strain (CARDIA)    Difficulty of Paying Living Expenses: Somewhat hard  Food Insecurity: No Food Insecurity (03/24/2024)   Hunger Vital Sign    Worried About Running Out of Food in the Last Year: Never  true    Ran Out of Food in the Last Year: Never true  Recent Concern: Food Insecurity - Food Insecurity Present (03/17/2024)   Hunger Vital Sign    Worried About Running Out of Food in the Last Year: Sometimes true    Ran Out of Food in the Last Year: Sometimes true  Transportation Needs: No Transportation Needs (03/24/2024)   PRAPARE - Administrator, Civil Service (Medical): No    Lack of Transportation (Non-Medical): No  Physical Activity: Sufficiently Active (04/25/2022)   Exercise Vital Sign    Days of Exercise per Week: 6 days    Minutes of Exercise per Session: 30 min  Stress: No Stress Concern Present (04/25/2022)   [employer] of Occupational Health - Occupational Stress Questionnaire    Feeling of Stress : Not at all  Social Connections: Moderately Isolated (04/25/2022)   Social Connection and Isolation Panel    Frequency of Communication with Friends and Family: More than three times a week    Frequency of Social Gatherings with Friends and Family: More than three times a week    Attends Religious Services: Never    Database administrator or Organizations: No    Attends Engineer, structural: Not on file    Marital Status: Living with partner  Intimate Partner Violence: Not At Risk (03/24/2024)   Humiliation, Afraid, Rape, and Kick questionnaire    Fear of Current or Ex-Partner: No    Emotionally Abused: No    Physically Abused: No    Sexually Abused: No    Review of Systems: ROS is O/W negative except as mentioned in HPI.  Physical Exam: Vital signs in last 24 hours: Temp:  [97.7 F (36.5 C)-98.1 F (36.7 C)] 97.9 F (36.6 C) (10/09 0716) Pulse Rate:  [16-78] 67 (10/09 0430) Resp:  [12-20] 18 (10/09 0430) BP: (122-139)/(60-90) 129/83 (10/09 0430) SpO2:  [96 %-100 %] 96 % (10/09 0430)   General:  Alert, Well-developed, well-nourished, pleasant and cooperative in NAD Head:  Normocephalic  and atraumatic. Eyes:  Scleral icterus noted. Ears:   Normal auditory acuity. Mouth:  No deformity or lesions.   Lungs:  Clear throughout to auscultation.   No wheezes, crackles, or rhonchi.  Heart:  Regular rate and rhythm; no murmurs, clicks, rubs, or gallops. Abdomen:  Soft, non-distended.  BS present.  Mild epigastric TTP.  Msk:  Symmetrical without gross deformities. Pulses:  Normal pulses noted. Extremities:  Without clubbing or edema. Neurologic:  Alert and oriented x 4;  grossly normal neurologically. Skin:  Intact without significant lesions or rashes. Psych:  Alert and cooperative. Normal mood and affect.  Lab Results: Recent Labs    03/25/24 0530 03/26/24 1330 03/27/24 0209  WBC 4.8 5.9 7.9  HGB 10.3* 10.2* 11.0*  HCT 32.6* 31.7* 34.6*  PLT 209 212 217   BMET Recent Labs    03/25/24 0530 03/26/24 1330 03/27/24 0209  NA 139 138 137  K 3.9 3.9 3.6  CL 104 102 100  CO2 25 25 26   GLUCOSE 102* 109* 164*  BUN 14 16 16   CREATININE 1.05 1.25* 1.21  CALCIUM 9.4 9.5 9.4   LFT Recent Labs    03/27/24 0209  PROT 7.0  ALBUMIN 3.9  AST 164*  ALT 495*  ALKPHOS 316*  BILITOT 12.3*   PT/INR Recent Labs    03/24/24 1648  LABPROT 13.9  INR 1.0   Studies/Results: CT ABDOMEN PELVIS W CONTRAST Result Date: 03/27/2024 CLINICAL DATA:  Epigastric abdominal pain following ERCP EXAM: CT ABDOMEN AND PELVIS WITH CONTRAST TECHNIQUE: Multidetector CT imaging of the abdomen and pelvis was performed using the standard protocol following bolus administration of intravenous contrast. RADIATION DOSE REDUCTION: This exam was performed according to the departmental dose-optimization program which includes automated exposure control, adjustment of the mA and/or kV according to patient size and/or use of iterative reconstruction technique. CONTRAST:  OMNIPAQUE  IOHEXOL  300 MG/ML  SOLN COMPARISON:  03/24/2024 FINDINGS: Lower chest: No acute abnormality.  Mild bibasilar atelectasis Hepatobiliary: Mild hepatic steatosis. Bilobar hepatic  metastatic disease is again identified and appears stable. Intra and extrahepatic biliary ductal dilation appears stable there is no contrast opacification of the gallbladder lumen. Since the prior examination, palliative metallic internal biliary stent has been placed traversing the ampulla and extending into the cystic duct this device does not clearly communicate with the common duct proximal to the obstructing mass, particularly when compared to prior examination where both ductal structures are better delineated. Pancreas: Hypoenhancing mass within the head of the pancreas is again identified measuring roughly 3.5 x 4.7 cm on image 31/2. There is normal enhancement of the body and tail the pancreas. Previously noted pancreatic ductal dilation has resolved. There is now developed mild peripancreatic inflammatory stranding diffusely in keeping with changes of mild interstitial/edematous pancreatitis. No loculated peripancreatic fluid collections. No pancreatic peripancreatic necrosis identified. Spleen: Unremarkable Adrenals/Urinary Tract: The adrenal glands are unremarkable. 10 mm nonobstructing calculus noted within the upper pole of the left kidney. Simple cortical cyst noted within the upper pole the left kidney for which no follow-up imaging is recommended. The kidneys are normal in size and position. No hydronephrosis. No ureteral calculi. No perinephric inflammatory stranding or fluid collections are seen. The bladder is unremarkable. Stomach/Bowel: Stomach is within normal limits. Appendix appears normal. No evidence of bowel wall thickening, distention, or inflammatory changes. Vascular/Lymphatic: The main portal vein is encased by the mass and markedly narrowed at the portomesenteric confluence, similar to prior examination, best seen on image 30/2 and 91/9. This  vessel remains patent, however. The abdominal vasculature is otherwise unremarkable. Reproductive: Prostate is unremarkable. Other: Trace  ascites Musculoskeletal: No acute or significant osseous findings. IMPRESSION: 1. Interval placement of a palliative metallic internal biliary stent traversing the ampulla and extending into the cystic duct. This device does not clearly communicate with the common duct proximal to the obstructing mass, particularly when compared to prior examination where both ductal structures are better delineated. 2. Stable intra and extrahepatic biliary ductal dilation. 3. Stable bilobar hepatic metastatic disease. 4. Stable 4.7 cm hypoenhancing mass within the head of the pancreas. 5. Interval development of mild peripancreatic inflammatory stranding in keeping with changes of mild interstitial/edematous pancreatitis. No loculated peripancreatic fluid collections. No pancreatic or peripancreatic necrosis identified. 6. Stable encasement and marked narrowing of the main portal vein at the portomesenteric confluence. This vessel remains patent. 7. Mild hepatic steatosis. 8. Minimal left nonobstructing nephrolithiasis.  A Electronically Signed   By: Dorethia Molt M.D.   On: 03/27/2024 04:34   US  BIOPSY (LIVER) Result Date: 03/26/2024 INDICATION: [id number] Liver mass [id number].  History of pancreatic cancer. EXAM: ULTRASOUND GUIDED LIVER MASS BIOPSY COMPARISON:  CT AP, 03/24/2024.  US  Abdomen, 03/09/2024. MEDICATIONS: None ANESTHESIA/SEDATION: Moderate (conscious) sedation was employed during this procedure. A total of Versed  2 mg and Fentanyl  100 mcg was administered intravenously. Moderate Sedation Time: 10 minutes. The patient's level of consciousness and vital signs were monitored continuously by radiology nursing throughout the procedure under my direct supervision. COMPLICATIONS: None immediate. PROCEDURE: Informed written consent was obtained from the patient and/or patient's representative after a discussion of the risks, benefits and alternatives to treatment. The patient understands and consents the procedure. A timeout was  performed prior to the initiation of the procedure. Ultrasound scanning was performed of the right upper abdominal quadrant demonstrates multifocal hepatic masses The RIGHT hepatic lobe was selected for biopsy and the procedure was planned. The right upper abdominal quadrant was prepped and draped in the usual sterile fashion. The overlying soft tissues were anesthetized with 1% lidocaine . A 17 gauge co-axial needle was advanced into a peripheral aspect of the lesion. This was followed by 4 core biopsies with an 18 gauge core device under direct ultrasound guidance. The needle was removed, then superficial hemostasis was obtained with manual compression. Post procedural scanning was negative for definitive area of hemorrhage or additional complication. A dressing was placed. The patient tolerated the procedure well without immediate post procedural complication. IMPRESSION: Successful ultrasound-guided core needle biopsy of a liver mass. Thom Hall, MD [facility] Radiology Electronically Signed   By: Thom Hall M.D.   On: 03/26/2024 14:05   DG ERCP Result Date: 03/26/2024 CLINICAL DATA:  [id number] Surgery, elective [id number] EXAM: ERCP COMPARISON:  CT AP, 03/26/2024 and 03/09/2024. FLUOROSCOPY: Exposure Index (as provided by the fluoroscopic device): 215 mGy Kerma FINDINGS: Limited oblique planar images of the RIGHT upper quadrant obtained C-arm. Images demonstrating flexible endoscopy, biliary duct cannulation, retrograde cholangiogram and metallic biliary stent placement. No biliary ductal dilation. No evidence of biliary filling defect is demonstrated. IMPRESSION: Fluoroscopic imaging for ERCP and metallic biliary stent placement. For complete description of intra procedural findings, please see performing service dictation. Electronically Signed   By: Thom Hall M.D.   On: 03/26/2024 09:34   DG C-Arm 1-60 Min-No Report Result Date: 03/25/2024 Fluoroscopy was  utilized by the requesting physician.  No radiographic interpretation.   DG C-Arm 1-60 Min-No Report Result Date: 03/25/2024 Fluoroscopy was utilized by the requesting  physician.  No radiographic interpretation.    IMPRESSION:  53 year old male with new diagnosis of pancreatic adenocarcinoma with metastatic disease to the liver.  Was supposed start chemotherapy, but LFTs found to be further elevated from previous with a total bili of 11.  CT scan showed increase in mild to moderate biliary ductal dilatation.  Common duct is obstructed by the pancreatic head mass.  ERCP 10/7:  Malignant obstructive jaundice secondary to distal biliary stricture status post biliary sphincterotomy and self-expanding metal stent placement.  Discharged home 10/8, but presented back same day with complaints of abdominal pain.  Lipase slightly elevated and findings of acute pancreatitis on CT scan.  Also appears the SEMS may be in the cystic duct/migrated.  LFTs still elevated.  PLAN: -Supportive care for the pancreatitis with IV hydration, pain control, antiemetics.  I ordered a liter bolus of LR as it looks like he is only been getting 150 mL/h infusion. - Will start him on Zosyn empirically for now. - Trend labs. - Will need repeat ERCP with attempt at removal of current stent and placement of new stent.  Today vs tomorrow.  Will keep NPO for now.   Harlene BIRCH. Zehr  03/27/2024, 9:01 AM  GI ATTENDING  History, laboratories, x-rays personally reviewed.  Agree with comprehensive consultation as outlined above.  This patient is well-known to me for problems related to malignant obstructive jaundice secondary to pancreatic cancer (likely metastatic).  2 days ago he underwent a technically demanding ERCP which was eventually completed, with self-expanding uncovered metal stent placement.  He did well immediately post procedure as well as the following morning, without complaints.  Returns now with abdominal pain and  elevated lipase.  CT scan shows mild pancreatitis.  In addition  a metal stent appears to be in the cystic duct (this was a concern previous).  Unfortunately, no significant improvement in liver tests which may suggest low cystic duct insertion with malignant obstruction above this area.  He is now admitted for standard treatment for pancreatitis.  Placed on antibiotics due to biliary intervention with questionable adequate biliary drainage.  We will plan repeat ERCP tomorrow in hopes of removing the previously placed stent.  If so, we will try to place a new stent in the biliary tree proximal to the obstructing lesion.  If one of the other interventions are not successful, will need percutaneous intervention to achieve biliary drainage.  I had discussed this with him previously.  Will follow.  Norleen SAILOR. Abran Raddle., M.D. [facility] Division of Gastroenterology

## 2024-03-27 NOTE — ED Triage Notes (Signed)
 Pt reports having stent to his biliary duct yesterday.  Pt began having pain  tonight about 7 and since then has progressively had more pain in his mid epigastric area.  No n/v  No fever or chills.

## 2024-03-27 NOTE — ED Notes (Addendum)
 Cultures ordered and obtained after abx started

## 2024-03-27 NOTE — ED Provider Notes (Signed)
 [place] EMERGENCY DEPARTMENT AT [facility] Provider Note   CSN: [id number] Arrival date & time: 03/27/24  9857     Patient presents with: Post-op Problem   Douglas Harper is a 53 y.o. male.   The history is provided by the patient.   He has history of hypertension, hypothyroidism, GERD who had ERCP with biliary stent placed earlier today with findings of cancer of the head of the pancreas arm and is complaining of epigastric pain with some radiation to the back.  He also had a liver biopsy done today.  He denies nausea.  Pain started this evening.  He took a dose of hydromorphone  2 mg without relief.    Prior to Admission medications   Medication Sig Start Date End Date Taking? Authorizing Provider  amLODipine  (NORVASC ) 10 MG tablet Take 1 tablet (10 mg total) by mouth daily. 03/13/24   Darci Pore, MD  HYDROmorphone  (DILAUDID ) 2 MG tablet Take 0.5 tablets (1 mg total) by mouth every 6 (six) hours as needed for severe pain (pain score 7-10). 03/18/24   Debby Olam POUR, NP  levothyroxine  (SYNTHROID ) 125 MCG tablet TAKE 1 TABLET BY MOUTH 30 MINUTES BEFORE BREAKFAST (STOP DOSE) Patient taking differently: Take 125 mcg by mouth daily before breakfast. 12/14/23 12/13/24  Mercer Clotilda SAUNDERS, MD  lidocaine -prilocaine (EMLA) cream Apply 1 Application topically as needed. Apply 1 Application topically as needed (Apply 1 hours prior to appt) Patient not taking: No sig reported 03/20/24   Cloretta Arley NOVAK, MD  omeprazole (PRILOSEC) 40 MG capsule Take 40 mg by mouth daily. 02/29/24   [provider]  ondansetron  (ZOFRAN ) 4 MG tablet Take 1 tablet (4 mg total) by mouth every 8 (eight) hours as needed for nausea or vomiting. 03/20/24   Cloretta Arley NOVAK, MD  prochlorperazine (COMPAZINE) 10 MG tablet Take 1 tablet (10 mg total) by mouth every 6 (six) hours as needed for nausea or vomiting. 03/21/24   Cloretta Arley NOVAK, MD    Allergies: Patient has no known allergies.     Review of Systems  All other systems reviewed and are negative.   Updated Vital Signs BP 122/60   Pulse 78   Temp 98.1 F (36.7 C)   Resp 20   SpO2 98%   Physical Exam Vitals and nursing note reviewed.   53 year old male, resting comfortably and in no acute distress. Vital signs are normal. Oxygen saturation is 98%, which is normal. Head is normocephalic and atraumatic. PERRLA, EOMI. Oropharynx is clear.  Scleral icterus is present. Neck is nontender and supple without adenopathys. Lungs are clear without rales, wheezes, or rhonchi. Chest is nontender. Heart has regular rate and rhythm without murmur. Abdomen is soft, flat, with mild to moderate epigastric tenderness.  There is no rebound or guarding.  Mild hepatomegaly is noted but liver edge is nontender. Extremities have no cyanosis or edema, full range of motion is present. Skin is warm and dry without rash. Neurologic: Mental status is normal, cranial nerves are intact, moves all extremities equally.  (all labs ordered are listed, but only abnormal results are displayed) Labs Reviewed  CBC WITH DIFFERENTIAL/PLATELET  COMPREHENSIVE METABOLIC PANEL WITH GFR    EKG: None  Radiology: US  BIOPSY (LIVER) Result Date: 03/26/2024 INDICATION: [id number] Liver mass [id number].  History of pancreatic cancer. EXAM: ULTRASOUND GUIDED LIVER MASS BIOPSY COMPARISON:  CT AP, 03/24/2024.  US  Abdomen, 03/09/2024. MEDICATIONS: None ANESTHESIA/SEDATION: Moderate (conscious) sedation was employed during this procedure. A total  of Versed  2 mg and Fentanyl  100 mcg was administered intravenously. Moderate Sedation Time: 10 minutes. The patient's level of consciousness and vital signs were monitored continuously by radiology nursing throughout the procedure under my direct supervision. COMPLICATIONS: None immediate. PROCEDURE: Informed written consent was obtained from the patient and/or patient's representative after a discussion of the risks, benefits  and alternatives to treatment. The patient understands and consents the procedure. A timeout was performed prior to the initiation of the procedure. Ultrasound scanning was performed of the right upper abdominal quadrant demonstrates multifocal hepatic masses The RIGHT hepatic lobe was selected for biopsy and the procedure was planned. The right upper abdominal quadrant was prepped and draped in the usual sterile fashion. The overlying soft tissues were anesthetized with 1% lidocaine . A 17 gauge co-axial needle was advanced into a peripheral aspect of the lesion. This was followed by 4 core biopsies with an 18 gauge core device under direct ultrasound guidance. The needle was removed, then superficial hemostasis was obtained with manual compression. Post procedural scanning was negative for definitive area of hemorrhage or additional complication. A dressing was placed. The patient tolerated the procedure well without immediate post procedural complication. IMPRESSION: Successful ultrasound-guided core needle biopsy of a liver mass. Thom Hall, MD [facility] Rad[facility] Electronically Signed   By: Thom Hall M.D.   On: 03/26/2024 14:05   DG ERCP Result Date: 03/26/2024 CLINICAL DATA:  [id number] Surgery, elective [id number] EXAM: ERCP COMPARISON:  CT AP, 03/26/2024 and 03/09/2024. FLUOROSCOPY: Exposure Index (as provided by the fluoroscopic device): 215 mGy Kerma FINDINGS: Limited oblique planar images of the RIGHT upper quadrant obtained C-arm. Images demonstrating flexible endoscopy, biliary duct cannulation, retrograde cholangiogram and metallic biliary stent placement. No biliary ductal dilation. No evidence of biliary filling defect is demonstrated. IMPRESSION: Fluoroscopic imaging for ERCP and metallic biliary stent placement. For complete description of intra procedural findings, please see performing service dictation. Electronically Signed   By: Thom Hall  M.D.   On: 03/26/2024 09:34   DG C-Arm 1-60 Min-No Report Result Date: 03/25/2024 Fluoroscopy was utilized by the requesting physician.  No radiographic interpretation.   DG C-Arm 1-60 Min-No Report Result Date: 03/25/2024 Fluoroscopy was utilized by the requesting physician.  No radiographic interpretation.     Procedures   Medications Ordered in the ED - No data to display                                  Medical Decision Making Amount and/or Complexity of Data Reviewed Labs: ordered. Radiology: ordered.  Risk Prescription drug management. Decision regarding hospitalization.   Epigastric pain in patient who had ERCP with stent placement earlier today as well as liver biopsy.  This is a presentation with wide range of treatment options and carries with it a high risk of morbidity and complications.  Differential diagnosis includes, but is not limited to, peptic ulcer disease, GERD, esophageal perforation, gastric perforation, pancreatitis, postprocedure intraperitoneal bleeding.  I have reviewed his past records confirming ERCP with biliary stent placement earlier today and also liver biopsy earlier today.  I have ordered screening labs and CT scan of abdomen and pelvis.  I have ordered hydromorphone  for pain.  He got adequate pain relief with hydromorphone , but pain recurred and did require additional hydromorphone .  I have reviewed his laboratory test, my interpretation is stable anemia, normal WBC but with a mild left shift, elevated  transaminases and bilirubin and alkaline phosphatase not significantly changed from prior, elevated lipase significantly higher than on 03/09/2024 suggesting some degree of pancreatitis but that could be related to his ERCP with eye injection.  CT of abdomen shows the stent does not seem to extend to the common bile duct which raises concern for it having shifted and no longer functioning.  This would account for his recurrence of pain.  Also, mild  peripancreatic inflammatory stranding consistent with mild interstitial/edematous pancreatitis.  Once again, this could be related to the ERCP with dye injection.  I have discussed case with Dr. San of the neurology service who recommends admitting the patient so that the gastroenterologist to does ERCPs can evaluate the patient for possible adjustment of the stent.  In the meantime, patient has had recurrence of pain.  I feel he will likely need higher doses of hydromorphone  so I have increased his dose from 1 mg to 2 mg.  I have discussed case with Dr. Roxane of [facility], who agrees to admit the patient.     Final diagnoses:  Upper abdominal pain  Obstructive jaundice due to cancer [facility])  Microcytic anemia    ED Discharge Orders     None          Raford Lenis, MD 03/27/24 [id number]

## 2024-03-27 NOTE — H&P (Signed)
 History and Physical  Douglas Harper FMW:[id number] DOB: 10-09-1970 DOA: 03/27/2024  PCP: [facility]   Chief Complaint: Abdominal pain  HPI: Douglas Harper is a 53 y.o. male with medical history significant for hypertension, hypothyroidism, recently diagnosed pancreatic cancer and ERCP with biliary sphincterotomy and self-expanding metal stent on 10/7 being admitted to the hospital with intractable abdominal pain, acute pancreatitis and migrated stent.  Patient was discharged home by the hospitalist service on 10/8 after having IR liver biopsy without complication.  He states that he was feeling okay when he went home, but after going home and having a few sips of water and p.o. medications, he started having severe burning midepigastric abdominal pain which was radiating straight into his back.  He denies any associated nausea, vomiting, fevers, chills, chest pain or other concerns.  On evaluation in the emergency department early this morning, he was found to be hemodynamically stable, has stable elevated LFTs and CBC, but evidence of acute pancreatitis.  ER provider discussed with [facility] who will see him in consultation this morning, and hospitalist admission for management of his acute pancreatitis was requested.  Review of Systems: Please see HPI for pertinent positives and negatives. A complete 10 system review of systems are otherwise negative.  Past Medical History:  Diagnosis Date   COVID-19    03/17/20   FH: heart disease    GERD (gastroesophageal reflux disease)    Glaucoma    Hypertension    Hyperthyroidism    s/p RAI 2009    Hypothyroidism    Verruca vulgaris    Past Surgical History:  Procedure Laterality Date   ANTERIOR CRUCIATE LIGAMENT REPAIR Right 2000   HAND SURGERY Right 2001   with hardware implanted   IR IMAGING GUIDED PORT INSERTION  03/17/2024   NO PAST SURGERIES     Social History:  reports that he has never smoked. He has never used  smokeless tobacco. He reports current alcohol use. He reports that he does not use drugs.  No Known Allergies  Family History  Problem Relation Age of Onset   Heart disease Mother    Hypertension Mother    Diabetes Mother    Colon cancer Neg Hx    Esophageal cancer Neg Hx    Pancreatic cancer Neg Hx    Liver disease Neg Hx    Rectal cancer Neg Hx    Stomach cancer Neg Hx      Prior to Admission medications   Medication Sig Start Date End Date Taking? Authorizing Provider  amLODipine  (NORVASC ) 10 MG tablet Take 1 tablet (10 mg total) by mouth daily. 03/13/24   Darci Pore, MD  HYDROmorphone  (DILAUDID ) 2 MG tablet Take 0.5 tablets (1 mg total) by mouth every 6 (six) hours as needed for severe pain (pain score 7-10). 03/18/24   Debby Olam POUR, NP  levothyroxine  (SYNTHROID ) 125 MCG tablet TAKE 1 TABLET BY MOUTH 30 MINUTES BEFORE BREAKFAST (STOP DOSE) Patient taking differently: Take 125 mcg by mouth daily before breakfast. 12/14/23 12/13/24  Mercer Clotilda SAUNDERS, MD  lidocaine -prilocaine (EMLA) cream Apply 1 Application topically as needed. Apply 1 Application topically as needed (Apply 1 hours prior to appt) Patient not taking: No sig reported 03/20/24   Cloretta Arley NOVAK, MD  omeprazole (PRILOSEC) 40 MG capsule Take 40 mg by mouth daily. 02/29/24   [provider]  ondansetron  (ZOFRAN ) 4 MG tablet Take 1 tablet (4 mg total) by mouth every 8 (eight) hours  as needed for nausea or vomiting. 03/20/24   Cloretta Arley NOVAK, MD  prochlorperazine (COMPAZINE) 10 MG tablet Take 1 tablet (10 mg total) by mouth every 6 (six) hours as needed for nausea or vomiting. 03/21/24   Cloretta Arley NOVAK, MD    Physical Exam: BP 129/83   Pulse 67   Temp 97.9 F (36.6 C) (Oral)   Resp 18   SpO2 96%  General:  Alert, oriented, calm, in no acute distress Cardiovascular: RRR, no murmurs or rubs, no peripheral edema  Respiratory: clear to auscultation bilaterally, no wheezes, no crackles  Abdomen:  soft, tender epigastrium, nondistended, normal bowel tones heard  Skin: dry, no rashes, left anterior chest port in place slightly tender to palpation but without evidence of acute infection or other complication Musculoskeletal: no joint effusions, normal range of motion  Psychiatric: appropriate affect, normal speech  Neurologic: extraocular muscles intact, clear speech, moving all extremities with intact sensorium         Labs on Admission:  Basic Metabolic Panel: Recent Labs  Lab 03/24/24 0845 03/24/24 1852 03/25/24 0530 03/26/24 1330 03/27/24 0209  NA 137  --  139 138 137  K 3.7  --  3.9 3.9 3.6  CL 101  --  104 102 100  CO2 27  --  25 25 26   GLUCOSE 139*  --  102* 109* 164*  BUN 13  --  14 16 16   CREATININE 1.19 1.01 1.05 1.25* 1.21  CALCIUM 10.2  --  9.4 9.5 9.4  MG  --   --   --  1.9  --    Liver Function Tests: Recent Labs  Lab 03/24/24 0845 03/25/24 0530 03/26/24 1330 03/27/24 0209  AST 296* 246* 178* 164*  ALT 689* 623* 497* 495*  ALKPHOS 367* 319* 305* 316*  BILITOT 11.3*  11.3* 11.3* 11.1* 12.3*  PROT 7.8 6.9 6.9 7.0  ALBUMIN 4.2 3.9 3.6 3.9   Recent Labs  Lab 03/27/24 0209  LIPASE 288*   No results for input(s): AMMONIA in the last 168 hours. CBC: Recent Labs  Lab 03/24/24 0845 03/24/24 1852 03/25/24 0530 03/26/24 1330 03/27/24 0209  WBC 4.9 4.4 4.8 5.9 7.9  NEUTROABS 3.6  --   --  4.4 6.5  HGB 10.8* 10.6* 10.3* 10.2* 11.0*  HCT 32.4* 32.7* 32.6* 31.7* 34.6*  MCV 60.9* 61.1* 61.0* 60.5* 60.7*  PLT 216 211 209 212 217   Cardiac Enzymes: No results for input(s): CKTOTAL, CKMB, CKMBINDEX, TROPONINI in the last 168 hours. BNP (last 3 results) No results for input(s): BNP in the last 8760 hours.  ProBNP (last 3 results) No results for input(s): PROBNP in the last 8760 hours.  CBG: Recent Labs  Lab 03/25/24 2140  GLUCAP 268*    Radiological Exams on Admission: CT ABDOMEN PELVIS W CONTRAST Result Date:  03/27/2024 CLINICAL DATA:  Epigastric abdominal pain following ERCP EXAM: CT ABDOMEN AND PELVIS WITH CONTRAST TECHNIQUE: Multidetector CT imaging of the abdomen and pelvis was performed using the standard protocol following bolus administration of intravenous contrast. RADIATION DOSE REDUCTION: This exam was performed according to the departmental dose-optimization program which includes automated exposure control, adjustment of the mA and/or kV according to patient size and/or use of iterative reconstruction technique. CONTRAST:  OMNIPAQUE  IOHEXOL  300 MG/ML  SOLN COMPARISON:  03/24/2024 FINDINGS: Lower chest: No acute abnormality.  Mild bibasilar atelectasis Hepatobiliary: Mild hepatic steatosis. Bilobar hepatic metastatic disease is again identified and appears stable. Intra and extrahepatic biliary ductal dilation  appears stable there is no contrast opacification of the gallbladder lumen. Since the prior examination, palliative metallic internal biliary stent has been placed traversing the ampulla and extending into the cystic duct this device does not clearly communicate with the common duct proximal to the obstructing mass, particularly when compared to prior examination where both ductal structures are better delineated. Pancreas: Hypoenhancing mass within the head of the pancreas is again identified measuring roughly 3.5 x 4.7 cm on image 31/2. There is normal enhancement of the body and tail the pancreas. Previously noted pancreatic ductal dilation has resolved. There is now developed mild peripancreatic inflammatory stranding diffusely in keeping with changes of mild interstitial/edematous pancreatitis. No loculated peripancreatic fluid collections. No pancreatic peripancreatic necrosis identified. Spleen: Unremarkable Adrenals/Urinary Tract: The adrenal glands are unremarkable. 10 mm nonobstructing calculus noted within the upper pole of the left kidney. Simple cortical cyst noted within the upper  pole the left kidney for which no follow-up imaging is recommended. The kidneys are normal in size and position. No hydronephrosis. No ureteral calculi. No perinephric inflammatory stranding or fluid collections are seen. The bladder is unremarkable. Stomach/Bowel: Stomach is within normal limits. Appendix appears normal. No evidence of bowel wall thickening, distention, or inflammatory changes. Vascular/Lymphatic: The main portal vein is encased by the mass and markedly narrowed at the portomesenteric confluence, similar to prior examination, best seen on image 30/2 and 91/9. This vessel remains patent, however. The abdominal vasculature is otherwise unremarkable. Reproductive: Prostate is unremarkable. Other: Trace ascites Musculoskeletal: No acute or significant osseous findings. IMPRESSION: 1. Interval placement of a palliative metallic internal biliary stent traversing the ampulla and extending into the cystic duct. This device does not clearly communicate with the common duct proximal to the obstructing mass, particularly when compared to prior examination where both ductal structures are better delineated. 2. Stable intra and extrahepatic biliary ductal dilation. 3. Stable bilobar hepatic metastatic disease. 4. Stable 4.7 cm hypoenhancing mass within the head of the pancreas. 5. Interval development of mild peripancreatic inflammatory stranding in keeping with changes of mild interstitial/edematous pancreatitis. No loculated peripancreatic fluid collections. No pancreatic or peripancreatic necrosis identified. 6. Stable encasement and marked narrowing of the main portal vein at the portomesenteric confluence. This vessel remains patent. 7. Mild hepatic steatosis. 8. Minimal left nonobstructing nephrolithiasis.  A Electronically Signed   By: Dorethia Molt M.D.   On: 03/27/2024 04:34   US  BIOPSY (LIVER) Result Date: 03/26/2024 INDICATION: [id number] Liver mass [id number].  History of pancreatic cancer. EXAM:  ULTRASOUND GUIDED LIVER MASS BIOPSY COMPARISON:  CT AP, 03/24/2024.  US  Abdomen, 03/09/2024. MEDICATIONS: None ANESTHESIA/SEDATION: Moderate (conscious) sedation was employed during this procedure. A total of Versed  2 mg and Fentanyl  100 mcg was administered intravenously. Moderate Sedation Time: 10 minutes. The patient's level of consciousness and vital signs were monitored continuously by radiology nursing throughout the procedure under my direct supervision. COMPLICATIONS: None immediate. PROCEDURE: Informed written consent was obtained from the patient and/or patient's representative after a discussion of the risks, benefits and alternatives to treatment. The patient understands and consents the procedure. A timeout was performed prior to the initiation of the procedure. Ultrasound scanning was performed of the right upper abdominal quadrant demonstrates multifocal hepatic masses The RIGHT hepatic lobe was selected for biopsy and the procedure was planned. The right upper abdominal quadrant was prepped and draped in the usual sterile fashion. The overlying soft tissues were anesthetized with 1% lidocaine . A 17 gauge co-axial needle was advanced into a peripheral aspect of  the lesion. This was followed by 4 core biopsies with an 18 gauge core device under direct ultrasound guidance. The needle was removed, then superficial hemostasis was obtained with manual compression. Post procedural scanning was negative for definitive area of hemorrhage or additional complication. A dressing was placed. The patient tolerated the procedure well without immediate post procedural complication. IMPRESSION: Successful ultrasound-guided core needle biopsy of a liver mass. Thom Hall, MD [facility] Radiology Electronically Signed   By: Thom Hall M.D.   On: 03/26/2024 14:05   DG ERCP Result Date: 03/26/2024 CLINICAL DATA:  [id number] Surgery, elective [id number] EXAM: ERCP COMPARISON:   CT AP, 03/26/2024 and 03/09/2024. FLUOROSCOPY: Exposure Index (as provided by the fluoroscopic device): 215 mGy Kerma FINDINGS: Limited oblique planar images of the RIGHT upper quadrant obtained C-arm. Images demonstrating flexible endoscopy, biliary duct cannulation, retrograde cholangiogram and metallic biliary stent placement. No biliary ductal dilation. No evidence of biliary filling defect is demonstrated. IMPRESSION: Fluoroscopic imaging for ERCP and metallic biliary stent placement. For complete description of intra procedural findings, please see performing service dictation. Electronically Signed   By: Thom Hall M.D.   On: 03/26/2024 09:34   DG C-Arm 1-60 Min-No Report Result Date: 03/25/2024 Fluoroscopy was utilized by the requesting physician.  No radiographic interpretation.   DG C-Arm 1-60 Min-No Report Result Date: 03/25/2024 Fluoroscopy was utilized by the requesting physician.  No radiographic interpretation.   Assessment/Plan Douglas Harper is a 53 y.o. male with medical history significant for hypertension, hypothyroidism, recently diagnosed pancreatic cancer and ERCP with biliary sphincterotomy and self-expanding metal stent on 10/7 being admitted to the hospital with intractable abdominal pain, acute pancreatitis and migrated stent.  Acute pancreatitis-post ERCP, also complicated by his metal biliary stent which seems to have migrated. -Inpatient admission -N.p.o. -Copious IV fluids -Pain and nausea medication as needed -ER provider has discussed with [facility], who will consult this morning  Hypertension-continue home amlodipine   Elevated LFTs-chemotherapy initiation is on hold due to rising LFTs, he underwent IR liver biopsy on 10/8 -Avoid hepatotoxins  Pancreatic cancer-Dr. Cloretta added to inpatient treatment team  Hypothyroidism-Synthroid   GERD-oral PPI  DVT prophylaxis: Lovenox     Code Status: Full Code  Consults called: [facility]  Admission status:  The appropriate patient status for this patient is INPATIENT. Inpatient status is judged to be reasonable and necessary in order to provide the required intensity of service to ensure the patient's safety. The patient's presenting symptoms, physical exam findings, and initial radiographic and laboratory data in the context of their chronic comorbidities is felt to place them at high risk for further clinical deterioration. Furthermore, it is not anticipated that the patient will be medically stable for discharge from the hospital within 2 midnights of admission.    I certify that at the point of admission it is my clinical judgment that the patient will require inpatient hospital care spanning beyond 2 midnights from the point of admission due to high intensity of service, high risk for further deterioration and high frequency of surveillance required  Time spent: 56 minutes  Mily Malecki CHRISTELLA Gail MD [facility] Pager [phone]  If 7PM-7AM, please contact night-coverage [email or web address] Password [email or web address]  03/27/2024, 7:48 AM

## 2024-03-27 NOTE — Progress Notes (Unsigned)
 Pharmacist Chemotherapy Monitoring - Initial Assessment    Anticipated start date: 04/07/24   The following has been reviewed per standard work regarding the patient's treatment regimen: The patient's diagnosis, treatment plan and drug doses, and organ/hematologic function Lab orders and baseline tests specific to treatment regimen  The treatment plan start date, drug sequencing, and pre-medications Prior authorization status  Patient's documented medication list, including drug-drug interaction screen and prescriptions for anti-emetics and supportive care specific to the treatment regimen The drug concentrations, fluid compatibility, administration routes, and timing of the medications to be used The patient's access for treatment and lifetime cumulative dose history, if applicable  The patient's medication allergies and previous infusion related reactions, if applicable   Changes made to treatment plan:  N/A  Follow up needed:  N/A   Wrenly Lauritsen Kreul, RPH, 03/27/2024  4:05 PM

## 2024-03-28 ENCOUNTER — Encounter (HOSPITAL_COMMUNITY): Payer: Self-pay | Admitting: Internal Medicine

## 2024-03-28 ENCOUNTER — Encounter (HOSPITAL_COMMUNITY)
Admission: EM | Disposition: A | Discharge status: Home / Self Care | Payer: Self-pay | Source: Home / Self Care | Attending: Internal Medicine

## 2024-03-28 ENCOUNTER — Inpatient Hospital Stay (HOSPITAL_COMMUNITY)

## 2024-03-28 ENCOUNTER — Inpatient Hospital Stay (HOSPITAL_COMMUNITY): Admitting: Certified Registered Nurse Anesthetist

## 2024-03-28 DIAGNOSIS — C250 Malignant neoplasm of head of pancreas: Secondary | ICD-10-CM

## 2024-03-28 DIAGNOSIS — K859 Acute pancreatitis without necrosis or infection, unspecified: Secondary | ICD-10-CM | POA: Diagnosis not present

## 2024-03-28 DIAGNOSIS — Z4659 Encounter for fitting and adjustment of other gastrointestinal appliance and device: Secondary | ICD-10-CM

## 2024-03-28 DIAGNOSIS — K838 Other specified diseases of biliary tract: Secondary | ICD-10-CM

## 2024-03-28 DIAGNOSIS — E039 Hypothyroidism, unspecified: Secondary | ICD-10-CM

## 2024-03-28 DIAGNOSIS — K9189 Other postprocedural complications and disorders of digestive system: Secondary | ICD-10-CM | POA: Diagnosis not present

## 2024-03-28 DIAGNOSIS — I10 Essential (primary) hypertension: Secondary | ICD-10-CM

## 2024-03-28 HISTORY — PX: ERCP: SHX5425

## 2024-03-28 LAB — CBC
HCT: 31.6 % — ABNORMAL LOW (ref 39.0–52.0)
Hemoglobin: 10.1 g/dL — ABNORMAL LOW (ref 13.0–17.0)
MCH: 19.3 pg — ABNORMAL LOW (ref 26.0–34.0)
MCHC: 32 g/dL (ref 30.0–36.0)
MCV: 60.5 fL — ABNORMAL LOW (ref 80.0–100.0)
Platelets: 172 K/uL (ref 150–400)
RBC: 5.22 MIL/uL (ref 4.22–5.81)
RDW: 15.8 % — ABNORMAL HIGH (ref 11.5–15.5)
WBC: 9.1 K/uL (ref 4.0–10.5)
nRBC: 0 % (ref 0.0–0.2)

## 2024-03-28 LAB — COMPREHENSIVE METABOLIC PANEL WITH GFR
ALT: 352 U/L — ABNORMAL HIGH (ref 0–44)
AST: 110 U/L — ABNORMAL HIGH (ref 15–41)
Albumin: 3.6 g/dL (ref 3.5–5.0)
Alkaline Phosphatase: 270 U/L — ABNORMAL HIGH (ref 38–126)
Anion gap: 11 (ref 5–15)
BUN: 16 mg/dL (ref 6–20)
CO2: 26 mmol/L (ref 22–32)
Calcium: 9 mg/dL (ref 8.9–10.3)
Chloride: 101 mmol/L (ref 98–111)
Creatinine, Ser: 1.48 mg/dL — ABNORMAL HIGH (ref 0.61–1.24)
GFR, Estimated: 56 mL/min — ABNORMAL LOW (ref >60)
Glucose, Bld: 114 mg/dL — ABNORMAL HIGH (ref 70–99)
Potassium: 4 mmol/L (ref 3.5–5.1)
Sodium: 138 mmol/L (ref 135–145)
Total Bilirubin: 16.5 mg/dL — ABNORMAL HIGH (ref 0.0–1.2)
Total Protein: 6.7 g/dL (ref 6.5–8.1)

## 2024-03-28 SURGERY — ERCP, WITH INTERVENTION IF INDICATED
Anesthesia: General

## 2024-03-28 MED ORDER — SODIUM CHLORIDE 0.9 % IV SOLN
INTRAVENOUS | Status: DC | PRN
Start: 2024-03-28 — End: 2024-03-28
  Administered 2024-03-28: 10 mL

## 2024-03-28 MED ORDER — FENTANYL CITRATE (PF) 100 MCG/2ML IJ SOLN
INTRAMUSCULAR | Status: DC | PRN
  Administered 2024-03-28: 100 ug via INTRAVENOUS

## 2024-03-28 MED ORDER — FENTANYL CITRATE (PF) 100 MCG/2ML IJ SOLN
INTRAMUSCULAR | Status: AC
  Filled 2024-03-28: qty 2

## 2024-03-28 MED ORDER — INDOMETHACIN 50 MG RE SUPP
RECTAL | Status: AC
  Filled 2024-03-28: qty 2

## 2024-03-28 MED ORDER — PROPOFOL 10 MG/ML IV BOLUS
INTRAVENOUS | Status: AC
  Filled 2024-03-28: qty 20

## 2024-03-28 MED ORDER — INDOMETHACIN 50 MG RE SUPP
RECTAL | Status: DC | PRN
  Administered 2024-03-28: 100 mg via RECTAL

## 2024-03-28 MED ORDER — LACTATED RINGERS IV SOLN: INTRAVENOUS | Status: AC

## 2024-03-28 MED ORDER — DEXAMETHASONE SOD PHOSPHATE PF 10 MG/ML IJ SOLN
INTRAMUSCULAR | Status: DC | PRN
  Administered 2024-03-28: 10 mg via INTRAVENOUS

## 2024-03-28 MED ORDER — PROPOFOL 10 MG/ML IV BOLUS
INTRAVENOUS | Status: DC | PRN
  Administered 2024-03-28: 200 mg via INTRAVENOUS

## 2024-03-28 MED ORDER — GLUCAGON HCL RDNA (DIAGNOSTIC) 1 MG IJ SOLR
INTRAMUSCULAR | Status: AC
  Filled 2024-03-28: qty 1

## 2024-03-28 MED ORDER — LIDOCAINE HCL (CARDIAC) PF 100 MG/5ML IV SOSY
PREFILLED_SYRINGE | INTRAVENOUS | Status: DC | PRN
  Administered 2024-03-28: 50 mg via INTRAVENOUS

## 2024-03-28 MED ORDER — ROCURONIUM BROMIDE 100 MG/10ML IV SOLN
INTRAVENOUS | Status: DC | PRN
  Administered 2024-03-28: 100 mg via INTRAVENOUS

## 2024-03-28 MED ORDER — SUGAMMADEX SODIUM 200 MG/2ML IV SOLN
INTRAVENOUS | Status: DC | PRN
Start: 2024-03-28 — End: 2024-03-28
  Administered 2024-03-28: 200 mg via INTRAVENOUS

## 2024-03-28 NOTE — Progress Notes (Addendum)
 Patient is on for ERCP today for attempt at removal of current stent and either readjustment of that stent or placement of a new stent in the CBD.  Current stent appears to have migrated to the cystic duct.  He did have a fever overnight, but looks well this morning, sitting up in the chair.  Anxious to have the procedure completed.  His white blood cell count remains normal.  He is on Zosyn.   GI ATTENDING  Interval history data reviewed.  Patient personally seen and examined in the endoscopy suite preprocedure.  Pain slightly less than yesterday.  Does not look toxic.  Mild tenderness on exam.  Jaundice.  For ERCP today. The nature of the procedure, as well as the risks, benefits, and alternatives were carefully and thoroughly reviewed with the patient. Ample time for discussion and questions allowed. The patient understood, was satisfied, and agreed to proceed. If unable to achieve proper biliary drainage endoscopically, he will need PTC.  He understands.  Norleen SAILOR. Abran Raddle., M.D. [facility] Division of Gastroenterology

## 2024-03-28 NOTE — Progress Notes (Signed)
   03/28/24 0845  TOC Brief Assessment  Insurance and Status Reviewed  Patient has primary care physician Yes  Home environment has been reviewed Resides in single family home  Prior level of function: Independent with ADLs at baseline  Prior/Current Home Services No current home services  Social Drivers of Health Review SDOH reviewed no interventions necessary  Readmission risk has been reviewed Yes  Transition of care needs no transition of care needs at this time

## 2024-03-28 NOTE — Anesthesia Preprocedure Evaluation (Addendum)
 Anesthesia Evaluation  Patient identified by MRN, date of birth, ID band Patient awake    Reviewed: Allergy & Precautions, NPO status , Patient's Chart, lab work & pertinent test results  History of Anesthesia Complications Negative for: history of anesthetic complications  Airway Mallampati: I  TM Distance: >3 FB Neck ROM: Full    Dental  (+) Teeth Intact, Dental Advisory Given   Pulmonary neg COPD, neg recent URI   breath sounds clear to auscultation       Cardiovascular hypertension,  Rhythm:Regular Rate:Tachycardia  1. IMild intracavitary gradient. Peak velocity 0.72 m/s. Peak gradient 2  mmHg. Left ventricular ejection fraction, by estimation, is 70 to 75%. The  left ventricle has hyperdynamic function. The left ventricle has no  regional wall motion abnormalities.  There is severe concentric left ventricular hypertrophy. Left ventricular  diastolic parameters are consistent with Grade I diastolic dysfunction  (impaired relaxation).   2. Right ventricular systolic function is normal. The right ventricular  size is normal. There is normal pulmonary artery systolic pressure.   3. The mitral valve is normal in structure. Trivial mitral valve  regurgitation. No evidence of mitral stenosis.   4. The aortic valve is tricuspid. Aortic valve regurgitation is not  visualized. No aortic stenosis is present.   5. Aortic dilatation noted. There is mild dilatation of the ascending  aorta, measuring 37 mm.   6. The inferior vena cava is normal in size with greater than 50%  respiratory variability, suggesting right atrial pressure of 3 mmHg.      Neuro/Psych    GI/Hepatic ,GERD  ,,  Endo/Other  Hypothyroidism    Renal/GU      Musculoskeletal   Abdominal   Peds  Hematology   Anesthesia Other Findings Pancreatic Cancer s/p Stent; Scleral Icterus   Reproductive/Obstetrics                               Anesthesia Physical Anesthesia Plan  ASA: 3  Anesthesia Plan: General   Post-op Pain Management:    Induction: Intravenous  PONV Risk Score and Plan: 1  Airway Management Planned: Oral ETT  Additional Equipment:   Intra-op Plan:   Post-operative Plan: Extubation in OR  Informed Consent:      Dental advisory given  Plan Discussed with: CRNA and Surgeon  Anesthesia Plan Comments:         Anesthesia Quick Evaluation

## 2024-03-28 NOTE — Progress Notes (Signed)
 PROGRESS NOTE    [name]  FMW:[id number] DOB: [birth year] DOA: 03/27/2024 PCP: Medicine, [facility]    Brief Narrative:   Douglas Harper is a 53 y.o. male with past medical history significant for stage IV pancreatic cancer status post recent ERCP with biliary sphincterotomy and metal stent placement 10/7, HTN, hypothyroidism who presented to [facility] ED on 03/27/2024 with recurrent severe epigastric abdominal pain with radiation towards his back.  Denied nausea, vomiting, no fever/chills, no chest pain.  In the ED, temperature 102.9 F, HR 115, RR 20, BP 162/90, SpO2 98% on 4 L nasal cannula.  WBC 7.9, hemoglobin 11.0, platelet count 217.  Sodium 137, potassium 3.6, chloride 100, CO2 26, glucose 164, BUN 16, creatinine 1.21.  Lipase 288.  AST 164, ALT 495, total bilirubin 12.3.  Urinalysis with moderate hemoglobin, moderate bilirubin, negative leukocytes/nitrite, no bacteria, 0-5 WBCs.  Blood cultures x 2 obtained.  Chest x-ray with no active Karve pulm disease process.  CT abdomen/pelvis with interval placement palliative metallic internal biliary stent transversing the ampulla and extending into the cystic duct, does not clearly communicate with the common duct proximal to the obstructing mass, intra/extrahepatic biliary ductal dilation, stable by lobar hepatic metastatic disease, 4.7 cm hypoenhancing mass at the end of the pancreas, mild peripancreatic inflammatory stranding consistent with mild pancreatitis without loculated fluid collection, no peripancreatic necrosis noted, stable encasement and marked narrowing main portal vein, mild hepatic steatosis, minimal left nonobstructing nephrolithiasis.  GI consulted.  TR[facility] consulted for admission for further evaluation and management of obstructive jaundice likely secondary to stent migration, acute pancreatitis.  Assessment & Plan:   Obstructive jaundice Hx stage IV pancreatic adenocarcinoma Concern for cholangitis secondary to biliary  obstruction Patient presenting to the ED with recurrent epigastric abdominal pain radiating toward his back.  Newly diagnosed 03/09/2024 and follows with oncology; Dr. Cloretta outpatient.  Recently had ERCP with biliary sphincterotomy and stent placement by GI, Dr. Abran on 03/25/2024.  Repeat imaging on admission notable for stent migration and now are not clearly communicating with the common duct proximal to the obstructing mass.  Also febrile 102.7 at time of admission without leukocytosis.  Noted elevated LFTs, total bilirubin. -- [facility] medical oncology following; appreciate assistance -- Blood cultures x 2: no growth < 24h -- AST 164>110 -- ALT 495>352 -- Tbili 12.3>16.5 -- Zosyn -- Zofran  4 mg IV every 6 hours.  Nausea/vomiting -- Oxycodone  10 mg p.o. every 4 hours as needed moderate pain -- Dilaudid  1 mg IV every 2 hours as needed severe pain -- CMP daily  Acute pancreatitis Patient complaining of epigastric pain complicated by obstructive jaundice as above.  Lipase elevated to 88.  CT imaging notable for mild peripancreatic stranding consistent with pancreatitis. -- Supportive care, pain control, LR at 150 mL/h -- CMP, repeat lipase in a.m.  HTN -- Amlodipine  10 mg p.o. daily  Hypothyroidism -- Levothyroxine  125 mcg p.o. daily  Anemia, microcytic Hemoglobin 10.1; stable  DVT prophylaxis: enoxaparin (LOVENOX) injection 40 mg Start: 03/27/24 2000    Code Status: Full Code Family Communication: No family present at bedside this morning  Disposition Plan:  Level of care: Progressive Status is: Inpatient Remains inpatient appropriate because: IV antibiotics, pending ERCP with stent replacement per GI for obstructive jaundice    Consultants:  [place] [facility], Dr. Abran Medical oncology, Dr. Cloretta  Procedures:  ERCP: Pending  Antimicrobials:  Zosyn 10/9>>   Subjective: Patient seen examined bedside, sitting in bedside chair.  Continues with  abdominal pain, utilizing pain medication.  Awaiting for repeat ERCP with stent replacement due to migration causing recurrent obstructive jaundice.  No other complaints or concerns at this time.  Denies headache, no dizziness, no current fever/chills, no nausea/vomiting/diarrhea, no chest pain, no palpitations, no focal weakness, no cough/congestion, no paresthesia.  No acute events overnight per nursing staff.  Objective: Vitals:   03/28/24 0441 03/28/24 0555 03/28/24 1003 03/28/24 1236  BP:  130/72 118/77 134/79  Pulse: (!) 108 (!) 108 95 96  Resp:  18 19 13   Temp: (!) 100.5 F (38.1 C) (!) 100.6 F (38.1 C) 98.4 F (36.9 C) 97.9 F (36.6 C)  TempSrc: Oral Oral Oral Temporal  SpO2: 95% 93% 97% 100%  Weight:    91.8 kg  Height:    6' 4 (1.93 m)    Intake/Output Summary (Last 24 hours) at 03/28/2024 1421 Last data filed at 03/28/2024 1402 Gross per 24 hour  Intake 1200.42 ml  Output 0 ml  Net 1200.42 ml   Filed Weights   03/27/24 1722 03/28/24 1236  Weight: 91.8 kg 91.8 kg    Examination:  Physical Exam: GEN: NAD, alert and oriented x 3, wd/wn HEENT: NCAT, PERRL, EOMI, sclera clear, MMM PULM: CTAB w/o wheezes/crackles, normal respiratory effort CV: RRR w/o M/G/R GI: abd soft, nondistended, mild TTP epigastric region, + BS MSK: no peripheral edema, moves all extremities independently NEURO: No focal neurological deficit PSYCH: normal mood/affect Integumentary: dry/intact, no rashes or wounds    Data Reviewed: I have personally reviewed following labs and imaging studies  CBC: Recent Labs  Lab 03/24/24 0845 03/24/24 1852 03/25/24 0530 03/26/24 1330 03/27/24 0209 03/28/24 0510  WBC 4.9 4.4 4.8 5.9 7.9 9.1  NEUTROABS 3.6  --   --  4.4 6.5  --   HGB 10.8* 10.6* 10.3* 10.2* 11.0* 10.1*  HCT 32.4* 32.7* 32.6* 31.7* 34.6* 31.6*  MCV 60.9* 61.1* 61.0* 60.5* 60.7* 60.5*  PLT 216 211 209 212 217 172   Basic Metabolic Panel: Recent Labs  Lab 03/24/24 0845  03/24/24 1852 03/25/24 0530 03/26/24 1330 03/27/24 0209 03/28/24 0510  NA 137  --  139 138 137 138  K 3.7  --  3.9 3.9 3.6 4.0  CL 101  --  104 102 100 101  CO2 27  --  25 25 26 26   GLUCOSE 139*  --  102* 109* 164* 114*  BUN 13  --  14 16 16 16   CREATININE 1.19 1.01 1.05 1.25* 1.21 1.48*  CALCIUM 10.2  --  9.4 9.5 9.4 9.0  MG  --   --   --  1.9  --   --    GFR: Estimated Creatinine Clearance: 70.1 mL/min (A) (by C-G formula based on SCr of 1.48 mg/dL (H)). Liver Function Tests: Recent Labs  Lab 03/24/24 0845 03/25/24 0530 03/26/24 1330 03/27/24 0209 03/28/24 0510  AST 296* 246* 178* 164* 110*  ALT 689* 623* 497* 495* 352*  ALKPHOS 367* 319* 305* 316* 270*  BILITOT 11.3*  11.3* 11.3* 11.1* 12.3* 16.5*  PROT 7.8 6.9 6.9 7.0 6.7  ALBUMIN 4.2 3.9 3.6 3.9 3.6   Recent Labs  Lab 03/27/24 0209  LIPASE 288*   No results for input(s): AMMONIA in the last 168 hours. Coagulation Profile: Recent Labs  Lab 03/24/24 1648  INR 1.0   Cardiac Enzymes: No results for input(s): CKTOTAL, CKMB, CKMBINDEX, TROPONINI in the last 168 hours. BNP (last 3 results) No results for input(s):  PROBNP in the last 8760 hours. HbA1C: No results for input(s): HGBA1C in the last 72 hours. CBG: Recent Labs  Lab 03/25/24 2140  GLUCAP 268*   Lipid Profile: No results for input(s): CHOL, HDL, LDLCALC, TRIG, CHOLHDL, LDLDIRECT in the last 72 hours. Thyroid  Function Tests: No results for input(s): TSH, T4TOTAL, FREET4, T3FREE, THYROIDAB in the last 72 hours. Anemia Panel: No results for input(s): VITAMINB12, FOLATE, FERRITIN, TIBC, IRON, RETICCTPCT in the last 72 hours. Sepsis Labs: No results for input(s): PROCALCITON, LATICACIDVEN in the last 168 hours.  Recent Results (from the past 240 hours)  Culture, blood (Routine X 2) w Reflex to ID Panel     Status: None (Preliminary result)   Collection Time: 03/27/24 11:30 AM   Specimen: BLOOD  LEFT FOREARM  Result Value Ref Range Status   Specimen Description   Final    BLOOD LEFT FOREARM Performed at [facility], [address], [place], [place] [address]    Special Requests   Final    BOTTLES DRAWN AEROBIC AND ANAEROBIC Blood Culture results may not be optimal due to an inadequate volume of blood received in culture bottles Performed at [facility], [address], [place], [place] [address]    Culture   Final    NO GROWTH < 24 HOURS Performed at [facility], [address] [place], [place] [address]    Report Status PENDING  Incomplete  Culture, blood (Routine X 2) w Reflex to ID Panel     Status: None (Preliminary result)   Collection Time: 03/27/24 11:35 AM   Specimen: BLOOD LEFT FOREARM  Result Value Ref Range Status   Specimen Description   Final    BLOOD LEFT FOREARM Performed at [facility], [address] [place], [place] [address]    Special Requests   Final    BOTTLES DRAWN AEROBIC AND ANAEROBIC Blood Culture results may not be optimal due to an inadequate volume of blood received in culture bottles Performed at [facility], [address] [place], [place] [address]    Culture   Final    NO GROWTH < 24 HOURS Performed at [facility], [address], [place], [place] [address]    Report Status PENDING  Incomplete         Radiology Studies: DG CHEST PORT 1 VIEW Result Date: 03/27/2024 CLINICAL DATA:  Fever, chest pain. EXAM: PORTABLE CHEST 1 VIEW COMPARISON:  March 09, 2024. FINDINGS: Stable cardiomediastinal silhouette. Interval placement of left internal jugular Port-A-Cath with distal tip in expected position of SVC. Both lungs are clear. The visualized skeletal structures are unremarkable. IMPRESSION: No active disease. Electronically Signed   By: Lynwood Landy Raddle M.D.   On: 03/27/2024 11:22   CT ABDOMEN PELVIS W CONTRAST Result Date: 03/27/2024 CLINICAL DATA:  Epigastric  abdominal pain following ERCP EXAM: CT ABDOMEN AND PELVIS WITH CONTRAST TECHNIQUE: Multidetector CT imaging of the abdomen and pelvis was performed using the standard protocol following bolus administration of intravenous contrast. RADIATION DOSE REDUCTION: This exam was performed according to the departmental dose-optimization program which includes automated exposure control, adjustment of the mA and/or kV according to patient size and/or use of iterative reconstruction technique. CONTRAST:  OMNIPAQUE  IOHEXOL  300 MG/ML  SOLN COMPARISON:  03/24/2024 FINDINGS: Lower chest: No acute abnormality.  Mild bibasilar atelectasis Hepatobiliary: Mild hepatic steatosis. Bilobar hepatic metastatic disease is again identified and appears stable. Intra and extrahepatic biliary ductal  dilation appears stable there is no contrast opacification of the gallbladder lumen. Since the prior examination, palliative metallic internal biliary stent has been placed traversing the ampulla and extending into the cystic duct this device does not clearly communicate with the common duct proximal to the obstructing mass, particularly when compared to prior examination where both ductal structures are better delineated. Pancreas: Hypoenhancing mass within the head of the pancreas is again identified measuring roughly 3.5 x 4.7 cm on image 31/2. There is normal enhancement of the body and tail the pancreas. Previously noted pancreatic ductal dilation has resolved. There is now developed mild peripancreatic inflammatory stranding diffusely in keeping with changes of mild interstitial/edematous pancreatitis. No loculated peripancreatic fluid collections. No pancreatic peripancreatic necrosis identified. Spleen: Unremarkable Adrenals/Urinary Tract: The adrenal glands are unremarkable. 10 mm nonobstructing calculus noted within the upper pole of the left kidney. Simple cortical cyst noted within the upper pole the left kidney for which no  follow-up imaging is recommended. The kidneys are normal in size and position. No hydronephrosis. No ureteral calculi. No perinephric inflammatory stranding or fluid collections are seen. The bladder is unremarkable. Stomach/Bowel: Stomach is within normal limits. Appendix appears normal. No evidence of bowel wall thickening, distention, or inflammatory changes. Vascular/Lymphatic: The main portal vein is encased by the mass and markedly narrowed at the portomesenteric confluence, similar to prior examination, best seen on image 30/2 and 91/9. This vessel remains patent, however. The abdominal vasculature is otherwise unremarkable. Reproductive: Prostate is unremarkable. Other: Trace ascites Musculoskeletal: No acute or significant osseous findings. IMPRESSION: 1. Interval placement of a palliative metallic internal biliary stent traversing the ampulla and extending into the cystic duct. This device does not clearly communicate with the common duct proximal to the obstructing mass, particularly when compared to prior examination where both ductal structures are better delineated. 2. Stable intra and extrahepatic biliary ductal dilation. 3. Stable bilobar hepatic metastatic disease. 4. Stable 4.7 cm hypoenhancing mass within the head of the pancreas. 5. Interval development of mild peripancreatic inflammatory stranding in keeping with changes of mild interstitial/edematous pancreatitis. No loculated peripancreatic fluid collections. No pancreatic or peripancreatic necrosis identified. 6. Stable encasement and marked narrowing of the main portal vein at the portomesenteric confluence. This vessel remains patent. 7. Mild hepatic steatosis. 8. Minimal left nonobstructing nephrolithiasis.  A Electronically Signed   By: Dorethia Molt M.D.   On: 03/27/2024 04:34        Scheduled Meds:  [MAR Hold] amLODipine   10 mg Oral Daily   [MAR Hold] enoxaparin (LOVENOX) injection  40 mg Subcutaneous Q24H   [MAR Hold]  indomethacin  100 mg Rectal Once   [MAR Hold] levothyroxine   125 mcg Oral QAC breakfast   [MAR Hold] pantoprazole   40 mg Oral Daily   Continuous Infusions:  sodium chloride  20 mL/hr at 03/28/24 1130   lactated ringers  150 mL/hr at 03/28/24 0900   [MAR Hold] piperacillin-tazobactam (ZOSYN)  IV 3.375 g (03/28/24 0854)     LOS: 1 day    Time spent: 48 minutes spent on 03/28/2024 caring for this patient face-to-face including chart review, ordering labs/tests, documenting, discussion with nursing staff, consultants, updating family and interview/physical exam    Camellia PARAS [place], DO [facility] Available via Epic secure chat 7am-7pm After these hours, please refer to coverage provider listed on [email or web address] 03/28/2024, 2:21 PM

## 2024-03-28 NOTE — Anesthesia Procedure Notes (Signed)
 Procedure Name: Intubation Date/Time: 03/28/2024 1:20 PM  Performed by: Levander Lani BIRCH, CRNAPre-anesthesia Checklist: Patient identified, Emergency Drugs available, Suction available, Patient being monitored and Timeout performed Patient Re-evaluated:Patient Re-evaluated prior to induction Oxygen Delivery Method: Circle system utilized Preoxygenation: Pre-oxygenation with 100% oxygen Induction Type: IV induction Ventilation: Mask ventilation without difficulty Laryngoscope Size: Miller and 2 Grade View: Grade I Tube type: Oral Tube size: 7.5 mm Number of attempts: 1 Airway Equipment and Method: Stylet Placement Confirmation: ETT inserted through vocal cords under direct vision, positive ETCO2 and breath sounds checked- equal and bilateral Secured at: 23 cm Tube secured with: Tape Dental Injury: Teeth and Oropharynx as per pre-operative assessment

## 2024-03-28 NOTE — Transfer of Care (Signed)
 Immediate Anesthesia Transfer of Care Note  Patient: Douglas Harper  Procedure(s) Performed: ERCP, WITH INTERVENTION IF INDICATED  Patient Location: PACU  Anesthesia Type:General  Level of Consciousness: awake, alert , and oriented  Airway & Oxygen Therapy: Patient Spontanous Breathing and Patient connected to face mask oxygen  Post-op Assessment: Report given to RN and Post -op Vital signs reviewed and stable  Post vital signs: Reviewed and stable  Last Vitals:  Vitals Value Taken Time  BP 185/99 03/28/24 14:32  Temp 37 C 03/28/24 14:30  Pulse 108 03/28/24 14:37  Resp 17 03/28/24 14:37  SpO2 97 % 03/28/24 14:37  Vitals shown include unfiled device data.  Last Pain:  Vitals:   03/28/24 1430  TempSrc: Temporal  PainSc: 0-No pain      Patients Stated Pain Goal: 0 (03/28/24 1430)  Complications: No notable events documented.

## 2024-03-28 NOTE — Op Note (Signed)
 Douglas Harper Patient Name: Douglas Harper Procedure Date: 03/28/2024 MRN: [id number] Attending MD: Douglas Harper , MD, [id number] Date of Birth: June 28, 1970 CSN: [id number] Age: 53 Admit Type: Inpatient Procedure:                ERCP with metal stent removal followed by                            self-expanding metal stent placement Indications:              Abdominal pain in the right upper quadrant, Biliary                            dilation on Computed Tomogram Scan, Abnormal liver                            function test, Acute pancreatitis. Patient with                            metastatic pancreatic cancer complicated by                            malignant obstructive jaundice. Underwent ERCP 3                            days ago with self-expanding metal stent placement.                            Patient Douglas Harper presented to the hospital with                            abdominal pain and no improvement in liver tests.                            CT imaging and blood work showed mild pancreatitis.                            As well, previously placed self-expanding metal                            stent was noted to be in the cystic duct. There was                            concern at the time of the last procedure. See                            report. He was admitted, placed on antibiotics, and                            is now for repeat ERCP. Providers:                Douglas SAILOR. Abran, MD, Douglas Penner, RN, [facility]  Douglas Harper, Technician Referring MD:             [facility] Medicines:                General Anesthesia Complications:            No immediate complications. Estimated Blood Loss:     Estimated blood loss was minimal. Procedure:                Pre-Anesthesia Assessment:                           - Prior to the procedure, a History and Physical                            was performed, and patient medications and                             allergies were reviewed. The patient is competent.                            The risks and benefits of the procedure and the                            sedation options and risks were discussed with the                            patient. All questions were answered and informed                            consent was obtained. Patient identification and                            proposed procedure were verified by the physician.                            Mental Status Examination: alert and oriented.                            Airway Examination: normal oropharyngeal airway and                            neck mobility. Respiratory Examination: clear to                            auscultation. CV Examination: normal. Prophylactic                            Antibiotics: The patient does not require                            prophylactic antibiotics. Prior Anticoagulants: The                            patient has taken no anticoagulant or antiplatelet  agents. ASA Grade Assessment: III - A patient with                            severe systemic disease. After reviewing the risks                            and benefits, the patient was deemed in                            satisfactory condition to undergo the procedure.                            The anesthesia plan was to use moderate sedation /                            analgesia (conscious sedation). Immediately prior                            to administration of medications, the patient was                            re-assessed for adequacy to receive sedatives. The                            heart rate, respiratory rate, oxygen saturations,                            blood pressure, adequacy of pulmonary ventilation,                            and response to care were monitored throughout the                            procedure. The physical status of the patient was                             re-assessed after the procedure.                           After obtaining informed consent, the scope was                            passed under direct vision. Throughout the                            procedure, the patient's blood pressure, pulse, and                            oxygen saturations were monitored continuously. The                            [id number]-Q190V ([id number]) Olympus duodenoscope was  introduced through the mouth, and used to inject                            contrast into and used to inject contrast into the                            bile duct. The ERCP was accomplished without                            difficulty. The patient tolerated the procedure                            well. Scope In: Scope Out: Findings:      1. The side-viewing scope was passed blindly into the esophagus. The       stomach and duodenum were grossly normal. At the level of the major       papilla was the recently placed metal stent. There were blood clots.      2. Scout radiograph of the abdomen with the endoscope and position       revealed that the previously placed stent was indeed in the cystic duct.       Contrast remained in the gallbladder from previous procedure.      3. The previously placed stent was grasped with a rat-tooth forceps and       removed. The ampulla was obscured by blood clots.      4. The cystic duct insertion was indeed quite low. We were able to       manipulate the wire into the common duct proper. Complete filling of       contrast yielded a malignant appearing distal bile duct stricture with       upstream dilation.      5. A 6 cm in length 10 mm in diameter uncovered SEMS was placed into the       bile duct in good position. Large volumes of purulent bile drained. The       position of the stent was good and drainage excellent.      6. No injection or manipulation of the pancreatic duct occurred. Impression:               1.  Previously placed metal stent located in the                            cystic duct was removed                           2. A new self-expanding metal stent was placed into                            the bile duct                           3. Large volumes of purulent bile drained well. Moderate Sedation:      none Recommendation:           1. Observe patient's clinical course.  2. Regular diet                           3. Continue antibiotics                           4. [facility]                           GI will follow Procedure Code(s):        --- Professional ---                           [phone], Endoscopic retrograde                            cholangiopancreatography (ERCP); with placement of                            endoscopic stent into biliary or pancreatic duct,                            including pre- and post-dilation and guide wire                            passage, when performed, including sphincterotomy,                            when performed, each stent Diagnosis Code(s):        --- Professional ---                           K83.8, Other specified diseases of biliary tract                           Z90.49, Acquired absence of other specified parts                            of digestive tract                           Z46.59, Encounter for fitting and adjustment of                            other gastrointestinal appliance and device                           R10.11, Right upper quadrant pain                           R79.89, Other specified abnormal findings of blood                            chemistry                           K85.90, Acute pancreatitis without necrosis or  infection, unspecified CPT copyright 2022 [employer]. All rights reserved. The codes documented in this report are preliminary and upon coder review may  be revised to meet current compliance  requirements. Douglas SAILOR. Abran, MD 03/28/2024 2:37:56 PM This report has been signed electronically. Number of Addenda: 0

## 2024-03-28 NOTE — Anesthesia Postprocedure Evaluation (Signed)
 Anesthesia Post Note  Patient: Douglas Harper  Procedure(s) Performed: ERCP, WITH INTERVENTION IF INDICATED     Patient location during evaluation: Endoscopy Anesthesia Type: General Level of consciousness: awake Pain management: pain level controlled Vital Signs Assessment: post-procedure vital signs reviewed and stable Respiratory status: spontaneous breathing Cardiovascular status: blood pressure returned to baseline Postop Assessment: no apparent nausea or vomiting Anesthetic complications: no   No notable events documented.  Last Vitals:  Vitals:   03/28/24 1236 03/28/24 1430  BP: 134/79 (!) 185/99  Pulse: 96   Resp: 13 (!) 22  Temp: 36.6 C 37 C  SpO2: 100% 96%    Last Pain:  Vitals:   03/28/24 1430  TempSrc: Temporal  PainSc: 0-No pain                 Lauraine KATHEE [place]

## 2024-03-28 NOTE — Progress Notes (Addendum)
 Douglas Harper   DOB:01/05/1971   FM#:[id number]      ASSESSMENT & PLAN:  Douglas Harper. Douglas Harper is a 53 year old male patient with oncologic history significant for newly diagnosed pancreatic cancer.  He was admitted 03/27/2024 with complaints of abdominal pain.  Medical oncology/Dr. Cloretta following closely.  Pancreatic cancer, stage IV (cT1c, cN0, pM1) - Newly diagnosed, 03/09/2024.  CT imaging showed 1.6 x 1.9 cm pancreatic head mass. - Liver mass pathology showed invasive moderately differentiated pancreatic adenocarcinoma. - Tumor markers elevated. - Chemotherapy (FOLFIRINOX q14 days) currently on hold due to elevated bilirubin and LFTs.  Will continue to monitor liver function to determine appropriate time to initiate chemotherapy. - Medical oncology/Dr. Cloretta following closely.  Abdominal pain Acute pancreatitis - Continue judicious pain medication regimen  Obstructive jaundice Hyperbilirubinemia Transaminitis - Secondary to malignancy - Status post ERCP with stent placement 03/25/2024.  However stent has migrated.  He was evaluated by GI he is currently n.p.o. for repeat ERCP with removal of current stent and replacement of new stent planned. - Continue IV antibiotics and IV fluids as ordered. - Avoid hepatotoxic agents - Continue to monitor liver function  Anemia, microcytic - Hemoglobin 10.1 with MCV 60.5 - Hemoglobin electrophoresis consistent with beta thalassemia minor    Code Status Full   Subjective:  Patient seen awake and alert sitting in chair at bedside.  Admits to upper abdominal pain which made it difficult for him to sleep throughout the night despite pain meds being received.  Reports he is going for procedure today and has been told not to eat.  Family members at bedside.  No other acute complaints offered.  Objective:   Intake/Output Summary (Last 24 hours) at 03/28/2024 0954 Last data filed at 03/27/2024 1945 Gross per 24 hour  Intake 1200.42 ml  Output --   Net 1200.42 ml     PHYSICAL EXAMINATION: ECOG PERFORMANCE STATUS: 1 - Symptomatic but completely ambulatory  Vitals:   03/28/24 0441 03/28/24 0555  BP:  130/72  Pulse: (!) 108 (!) 108  Resp:  18  Temp: (!) 100.5 F (38.1 C) (!) 100.6 F (38.1 C)  SpO2: 95% 93%   Filed Weights   03/27/24 1722  Weight: 202 lb 6.4 oz (91.8 kg)    GENERAL: alert, no distress and comfortable SKIN: Jaundice EYES: normal, conjunctiva are pink and non-injected, +scleral icterus OROPHARYNX: no exudate, no erythema and lips, buccal mucosa, and tongue normal  NECK: supple, thyroid  normal size, non-tender, without nodularity LYMPH: no palpable lymphadenopathy in the cervical, axillary or inguinal LUNGS: clear to auscultation and percussion with normal breathing effort HEART: regular rate & rhythm and no murmurs and no lower extremity edema ABDOMEN: abdomen soft, non-tender and normal bowel sounds MUSCULOSKELETAL: no cyanosis of digits and no clubbing  PSYCH: alert & oriented x 3 with fluent speech NEURO: no focal motor/sensory deficits   All questions were answered. The patient knows to call the clinic with any problems, questions or concerns.   The total time spent in the appointment was 40 minutes encounter with patient including review of chart and various tests results, discussions about plan of care and coordination of care plan  Douglas JINNY Brunner, NP 03/28/2024 9:54 AM    Labs Reviewed:  Lab Results  Component Value Date   WBC 9.1 03/28/2024   HGB 10.1 (L) 03/28/2024   HCT 31.6 (L) 03/28/2024   MCV 60.5 (L) 03/28/2024   PLT 172 03/28/2024   Recent Labs  03/24/24 0845 03/24/24 1852 03/26/24 1330 03/27/24 0209 03/28/24 0510  NA 137   < > 138 137 138  K 3.7   < > 3.9 3.6 4.0  CL 101   < > 102 100 101  CO2 27   < > 25 26 26   GLUCOSE 139*   < > 109* 164* 114*  BUN 13   < > 16 16 16   CREATININE 1.19   < > 1.25* 1.21 1.48*  CALCIUM 10.2   < > 9.5 9.4 9.0  GFRNONAA >60   < > >60  >60 56*  PROT 7.8   < > 6.9 7.0 6.7  ALBUMIN 4.2   < > 3.6 3.9 3.6  AST 296*   < > 178* 164* 110*  ALT 689*   < > 497* 495* 352*  ALKPHOS 367*   < > 305* 316* 270*  BILITOT 11.3*  11.3*   < > 11.1* 12.3* 16.5*  BILIDIR 7.6*  --   --   --   --   IBILI 3.7*  --   --   --   --    < > = values in this interval not displayed.    Studies Reviewed:  DG CHEST PORT 1 VIEW Result Date: 03/27/2024 CLINICAL DATA:  Fever, chest pain. EXAM: PORTABLE CHEST 1 VIEW COMPARISON:  March 09, 2024. FINDINGS: Stable cardiomediastinal silhouette. Interval placement of left internal jugular Port-A-Cath with distal tip in expected position of SVC. Both lungs are clear. The visualized skeletal structures are unremarkable. IMPRESSION: No active disease. Electronically Signed   By: Lynwood Landy Raddle M.D.   On: 03/27/2024 11:22   CT ABDOMEN PELVIS W CONTRAST Result Date: 03/27/2024 CLINICAL DATA:  Epigastric abdominal pain following ERCP EXAM: CT ABDOMEN AND PELVIS WITH CONTRAST TECHNIQUE: Multidetector CT imaging of the abdomen and pelvis was performed using the standard protocol following bolus administration of intravenous contrast. RADIATION DOSE REDUCTION: This exam was performed according to the departmental dose-optimization program which includes automated exposure control, adjustment of the mA and/or kV according to patient size and/or use of iterative reconstruction technique. CONTRAST:  OMNIPAQUE  IOHEXOL  300 MG/ML  SOLN COMPARISON:  03/24/2024 FINDINGS: Lower chest: No acute abnormality.  Mild bibasilar atelectasis Hepatobiliary: Mild hepatic steatosis. Bilobar hepatic metastatic disease is again identified and appears stable. Intra and extrahepatic biliary ductal dilation appears stable there is no contrast opacification of the gallbladder lumen. Since the prior examination, palliative metallic internal biliary stent has been placed traversing the ampulla and extending into the cystic duct this device does  not clearly communicate with the common duct proximal to the obstructing mass, particularly when compared to prior examination where both ductal structures are better delineated. Pancreas: Hypoenhancing mass within the head of the pancreas is again identified measuring roughly 3.5 x 4.7 cm on image 31/2. There is normal enhancement of the body and tail the pancreas. Previously noted pancreatic ductal dilation has resolved. There is now developed mild peripancreatic inflammatory stranding diffusely in keeping with changes of mild interstitial/edematous pancreatitis. No loculated peripancreatic fluid collections. No pancreatic peripancreatic necrosis identified. Spleen: Unremarkable Adrenals/Urinary Tract: The adrenal glands are unremarkable. 10 mm nonobstructing calculus noted within the upper pole of the left kidney. Simple cortical cyst noted within the upper pole the left kidney for which no follow-up imaging is recommended. The kidneys are normal in size and position. No hydronephrosis. No ureteral calculi. No perinephric inflammatory stranding or fluid collections are seen. The bladder is unremarkable. Stomach/Bowel: Stomach is  within normal limits. Appendix appears normal. No evidence of bowel wall thickening, distention, or inflammatory changes. Vascular/Lymphatic: The main portal vein is encased by the mass and markedly narrowed at the portomesenteric confluence, similar to prior examination, best seen on image 30/2 and 91/9. This vessel remains patent, however. The abdominal vasculature is otherwise unremarkable. Reproductive: Prostate is unremarkable. Other: Trace ascites Musculoskeletal: No acute or significant osseous findings. IMPRESSION: 1. Interval placement of a palliative metallic internal biliary stent traversing the ampulla and extending into the cystic duct. This device does not clearly communicate with the common duct proximal to the obstructing mass, particularly when compared to prior  examination where both ductal structures are better delineated. 2. Stable intra and extrahepatic biliary ductal dilation. 3. Stable bilobar hepatic metastatic disease. 4. Stable 4.7 cm hypoenhancing mass within the head of the pancreas. 5. Interval development of mild peripancreatic inflammatory stranding in keeping with changes of mild interstitial/edematous pancreatitis. No loculated peripancreatic fluid collections. No pancreatic or peripancreatic necrosis identified. 6. Stable encasement and marked narrowing of the main portal vein at the portomesenteric confluence. This vessel remains patent. 7. Mild hepatic steatosis. 8. Minimal left nonobstructing nephrolithiasis.  A Electronically Signed   By: Dorethia Molt M.D.   On: 03/27/2024 04:34   US  BIOPSY (LIVER) Result Date: 03/26/2024 INDICATION: [id number] Liver mass [id number].  History of pancreatic cancer. EXAM: ULTRASOUND GUIDED LIVER MASS BIOPSY COMPARISON:  CT AP, 03/24/2024.  US  Abdomen, 03/09/2024. MEDICATIONS: None ANESTHESIA/SEDATION: Moderate (conscious) sedation was employed during this procedure. A total of Versed  2 mg and Fentanyl  100 mcg was administered intravenously. Moderate Sedation Time: 10 minutes. The patient's level of consciousness and vital signs were monitored continuously by radiology nursing throughout the procedure under my direct supervision. COMPLICATIONS: None immediate. PROCEDURE: Informed written consent was obtained from the patient and/or patient's representative after a discussion of the risks, benefits and alternatives to treatment. The patient understands and consents the procedure. A timeout was performed prior to the initiation of the procedure. Ultrasound scanning was performed of the right upper abdominal quadrant demonstrates multifocal hepatic masses The RIGHT hepatic lobe was selected for biopsy and the procedure was planned. The right upper abdominal quadrant was prepped and draped in the usual sterile fashion. The  overlying soft tissues were anesthetized with 1% lidocaine . A 17 gauge co-axial needle was advanced into a peripheral aspect of the lesion. This was followed by 4 core biopsies with an 18 gauge core device under direct ultrasound guidance. The needle was removed, then superficial hemostasis was obtained with manual compression. Post procedural scanning was negative for definitive area of hemorrhage or additional complication. A dressing was placed. The patient tolerated the procedure well without immediate post procedural complication. IMPRESSION: Successful ultrasound-guided core needle biopsy of a liver mass. Thom Hall, MD [facility] Electronically Signed   By: Thom Hall M.D.   On: 03/26/2024 14:05   DG ERCP Result Date: 03/26/2024 CLINICAL DATA:  [id number] Surgery, elective [id number] EXAM: ERCP COMPARISON:  CT AP, 03/26/2024 and 03/09/2024. FLUOROSCOPY: Exposure Index (as provided by the fluoroscopic device): 215 mGy Kerma FINDINGS: Limited oblique planar images of the RIGHT upper quadrant obtained C-arm. Images demonstrating flexible endoscopy, biliary duct cannulation, retrograde cholangiogram and metallic biliary stent placement. No biliary ductal dilation. No evidence of biliary filling defect is demonstrated. IMPRESSION: Fluoroscopic imaging for ERCP and metallic biliary stent placement. For complete description of intra procedural findings, please see performing service dictation. Electronically Signed   By: Thom  Mugweru M.D.   On: 03/26/2024 09:34   DG C-Arm 1-60 Min-No Report Result Date: 03/25/2024 Fluoroscopy was utilized by the requesting physician.  No radiographic interpretation.   DG C-Arm 1-60 Min-No Report Result Date: 03/25/2024 Fluoroscopy was utilized by the requesting physician.  No radiographic interpretation.   CT ABDOMEN PELVIS W CONTRAST Result Date: 03/24/2024 CLINICAL DATA:  Elevated bilirubin. On chemotherapy for  pancreatic carcinoma. * Tracking Code: BO * EXAM: CT ABDOMEN AND PELVIS WITH CONTRAST TECHNIQUE: Multidetector CT imaging of the abdomen and pelvis was performed using the standard protocol following bolus administration of intravenous contrast. RADIATION DOSE REDUCTION: This exam was performed according to the departmental dose-optimization program which includes automated exposure control, adjustment of the mA and/or kV according to patient size and/or use of iterative reconstruction technique. CONTRAST:  OMNIPAQUE  IOHEXOL  300 MG/ML  SOLN COMPARISON:  03/09/2024 FINDINGS: Lower chest: Clear lung bases. Normal heart size without pericardial or pleural effusion. Hepatobiliary: Bilobar hepatic metastasis. Index segment 3 mass measures 3.8 x 3.3 cm in 24/2 versus 3.2 x 3.4 cm when measured in a similar fashion on the prior. Index subcapsular right hepatic lobe lesion measures 2.0 cm on image 31/2 versus 2.3 cm when remeasured in a similar fashion on the prior. Index subcapsular right hepatic lobe 1.6 cm lesion on 20/2 measured 1.4 cm on the prior. Intrahepatic biliary duct dilatation is mild and minimally increased. Common duct measures 12 mm on coronal image 58 versus 9 mm on the prior exam when remeasured in similar fashion. Common duct is obstructed by the pancreatic head mass detailed below. Subtle stone or stones in the gallbladder fundus. Pancreas: Pancreatic head/uncinate process mass measures 2.7 x 3.0 cm on 31/2 and is felt to be similar to on the prior (when remeasured). The upstream pancreatic duct dilatation and atrophy are moderate and similar. No evidence of superimposed pancreatitis. Spleen: Normal in size, without focal abnormality. Adrenals/Urinary Tract: Normal adrenal glands. Left renal collecting system calculi up to 1.0 cm Low-density left renal lesions up to 1.5 cm are likely cysts and do not warrant imaging follow-up. No hydronephrosis.  Normal urinary bladder. Stomach/Bowel: Normal  stomach, without wall thickening. Normal colon and terminal ileum. Normal small bowel. Vascular/Lymphatic: Aortic atherosclerosis. Splenoportal confluence and SMV narrowing by tumor. No arterial encasement. No abdominopelvic adenopathy. Reproductive: Normal prostate. Other: No significant free fluid.  No free intraperitoneal air. Musculoskeletal: Lumbar spondylosis. IMPRESSION: 1. The pancreatic head/uncinate process primary and liver metastasis are relatively similar. 2. Increase in mild to moderate biliary duct dilatation. 3. Incidental findings, including: Left nephrolithiasis. Cholelithiasis. Case discussed with Dr. Cloretta  at 1:10 p.m. Electronically Signed   By: Rockey Kilts M.D.   On: 03/24/2024 13:22   CT Abdomen Pelvis W Contrast Result Date: 03/18/2024 CLINICAL DATA:  Liver mass on right upper quadrant ultrasound, chest pain. * Tracking Code: BO * EXAM: CT CHEST WITH CONTRAST CT ABDOMEN WITHOUT AND WITH CONTRAST TECHNIQUE: Multidetector CT imaging of the abdomen was performed without intravenous contrast. Multidetector CT imaging of the chest and abdomen was then performed during bolus administration of intravenous contrast. RADIATION DOSE REDUCTION: This exam was performed according to the departmental dose-optimization program which includes automated exposure control, adjustment of the mA and/or kV according to patient size and/or use of iterative reconstruction technique. CONTRAST:  75mL OMNIPAQUE  IOHEXOL  350 MG/ML SOLN COMPARISON:  Ultrasound abdomen from earlier the same day. FINDINGS: CT CHEST FINDINGS Cardiovascular: Normal cardiac size. No pericardial effusion. No aortic aneurysm. There are coronary artery  calcifications, in keeping with coronary artery disease. Mediastinum/Nodes: Visualized thyroid  gland appears grossly unremarkable. No solid / cystic mediastinal masses. The esophagus is nondistended precluding optimal assessment. No axillary, mediastinal or hilar lymphadenopathy by size  criteria. Lungs/Pleura: The central tracheo-bronchial tree is patent. There are dependent changes in bilateral lungs. No mass or consolidation. No pleural effusion or pneumothorax. No suspicious lung nodules. Musculoskeletal: The visualized soft tissues of the chest wall are grossly unremarkable. No suspicious osseous lesions. CT ABDOMEN PELVIS FINDINGS Hepatobiliary: The liver is normal in size. There is liver surface irregularity/nodularity, suggesting cirrhosis cirrhotic appearance due to underlying liver lesions described as follows. There are multiple (more than 15), ill-defined hypoattenuating masses throughout the liver with largest in the left hepatic lobe, segment 4 B/3 junction region measuring up to 3.0 x 3.2 cm. No intrahepatic or extrahepatic bile duct dilation. No calcified gallstones. Normal gallbladder wall thickness. No pericholecystic inflammatory changes. Pancreas: There is an ill-defined, hypoenhancing, approximately 1.6 x 1.9 cm lesion in the pancreatic head, which is highly concerning for pancreatic malignancy. There is resultant moderate upstream main pancreatic duct dilation, measuring up to 8 mm in the neck region. No peripancreatic fat stranding. There is resultant up to moderate narrowing of the portal vein at the level of confluence of SMV/splenic veins (series 7, image 66). Rest of the main portal vein and its tributaries are otherwise normal in caliber and patent. Unremarkable hepatic veins. There is subtle fat stranding surrounding the pancreatic head/uncinate process and neck region, which is nonspecific but can be seen with acute interstitial pancreatitis. Correlate clinically and with serum lipase levels. Spleen: Within normal limits. No focal lesion. Adrenals/Urinary Tract: There are bilateral adrenal adenomas. On the left it measures up to 1.0 x 1.6 cm and on the right it measures up to 0.9 x 1.2 cm. No suspicious renal mass. There is a 1.3 x 1.6 cm simple cyst arising from the  left kidney upper pole. There is a 6 x 10 mm nonobstructing calculus in the left kidney upper pole and an additional 2 mm nonobstructing calculus in the left kidney lower pole. No other nephroureterolithiasis on either side. No obstructive uropathy on either side. Urinary bladder is under distended, precluding optimal assessment. However, no large mass or stones identified. No perivesical fat stranding. Stomach/Bowel: No disproportionate dilation of the small or large bowel loops. No evidence of abnormal bowel wall thickening. There is subtle fat stranding surrounding the proximal duodenum, which is nonspecific but typically seen as sequela of pancreatitis or duodenitis. Correlate clinically. The appendix is unremarkable. Vascular/Lymphatic: No ascites or pneumoperitoneum. No abdominal or pelvic lymphadenopathy, by size criteria. No aneurysmal dilation of the major abdominal arteries. Reproductive: Normal size prostate. Symmetric seminal vesicles. Other: There is a tiny fat containing umbilical hernia. There are bilateral small fat containing inguinal hernias. Musculoskeletal: No suspicious osseous lesions. There are mild multilevel degenerative changes in the visualized spine. IMPRESSION: 1. There is an ill-defined, hypoenhancing, approximately 1.6 x 1.9 cm lesion in the pancreatic head, which is highly concerning for pancreatic malignancy. There is resultant moderate upstream main pancreatic duct dilation, measuring up to 8 mm in the neck region. There is also resultant up to moderate narrowing of the portal vein at the level of confluence of SMV/splenic veins. 2. There are multiple (more than 15), ill-defined hypoattenuating masses throughout the liver with largest in the left hepatic lobe, segment 4B/3 junction region measuring up to 3.0 x 3.2 cm. These are compatible with metastases. 3. There is subtle fat  stranding surrounding the pancreatic head/uncinate process and neck region, which is nonspecific but can  be seen with acute interstitial pancreatitis. There is subtle fat stranding surrounding the proximal duodenum, which is typically seen as sequela of pancreatitis or duodenitis. 4. No metastatic disease identified within the chest. 5. Multiple other nonacute observations, as described above. Aortic Atherosclerosis (ICD10-I70.0). Electronically Signed   By: Ree Molt M.D.   On: 03/18/2024 11:35   IR IMAGING GUIDED PORT INSERTION Result Date: 03/17/2024 CLINICAL DATA:  Pancreatic adenocarcinoma. Chest port placement for therapy. EXAM: Chest port catheter placement TECHNIQUE: Procedure performed using fluoroscopy and ultrasound CONTRAST:  None RADIOPHARMACEUTICALS:  None FLUOROSCOPY: Three mGy COMPARISON:  None FINDINGS: The patient was placed in supine position on the IR gantry and the left upper chest and neck were prepped and draped in the usual sterile fashion. The nurse administered intravenous fentanyl  and Versed  under my supervision and the nurse had no other injuries other than monitoring the patient and administering medications. I was present for the entire duration of procedure. 2 mg intravenous Versed , 100 mcg intravenous fentanyl , 25 mg Benadryl  administered for a total sedation time of 24 minutes Ultrasound guidance was used to investigate the left internal jugular vein which was anechoic and compressible indicating patency. The needle was then advanced from a skin incision through the soft tissue into the left internal jugular vein under ultrasound guidance. A final image was obtained and stored in the patient's permanent medical record. Access was then exchanged over a guidewire which was advanced under fluoroscopic guidance. The needle was removed and replaced with a micropuncture sheath. Approximately 2 inches below the clavicle the port pocket was created with a subsequent incision. The catheter was then tunneled from the port pocket to the venotomy site overlying the left internal jugular  vein. Access was then exchanged over an 035 guidewire for peel-away sheath which was advanced over the guidewire under fluoroscopic guidance. The catheter was then advanced through the peel-away sheath to the cavoatrial junction. Sheath was removed. The catheter was then cut at the port pocket and connected to chest port. The chest port was tested for function and finally function well. The chest port was then flushed with heparin and a port pocket was closed with 4-0 suture. Final image was obtained demonstrating satisfactory position of chest port. The final count of all materials was satisfactory. IMPRESSION: 1. Satisfactory placement of left internal jugular vein single-lumen chest port. 2.  Okay to use and power inject chest port. Electronically Signed   By: Cordella Banner   On: 03/17/2024 15:51   US  BIOPSY (LIVER) Result Date: 03/11/2024 INDICATION: 53 year old male with history of multifocal liver masses suspicious for metastasis. EXAM: ULTRASOUND GUIDED LIVER LESION BIOPSY COMPARISON:  None Available. MEDICATIONS: None ANESTHESIA/SEDATION: Fentanyl  100 mcg IV; Versed  2 mg IV Total Moderate Sedation time:  17 minutes. The patient's level of consciousness and vital signs were monitored continuously by radiology nursing throughout the procedure under my direct supervision. COMPLICATIONS: None immediate. PROCEDURE: Informed written consent was obtained from the patient after a discussion of the risks, benefits and alternatives to treatment. The patient understands and consents the procedure. A timeout was performed prior to the initiation of the procedure. Ultrasound scanning was performed of the right upper abdominal quadrant demonstrates ill-defined hypoechoic mass in in the left lobe of the liver. The left lobe liver mass was selected for biopsy and the procedure was planned. The right upper abdominal quadrant was prepped and draped in the usual sterile  fashion. The overlying soft tissues were  anesthetized with 1% lidocaine  with epinephrine . A 17 gauge, 6.8 cm co-axial needle was advanced into a peripheral aspect of the lesion. This was followed by 3 core biopsies with an 18 gauge core device under direct ultrasound guidance. The coaxial needle tract was embolized with a small amount of Gel-Foam slurry and superficial hemostasis was obtained with manual compression. Post procedural scanning was negative for definitive area of hemorrhage or additional complication. A dressing was placed. The patient tolerated the procedure well without immediate post procedural complication. IMPRESSION: Technically successful ultrasound guided core needle biopsy of left lobe liver mass. Ester Sides, MD [facility] Radiology Electronically Signed   By: Ester Sides M.D.   On: 03/11/2024 16:20   CT ABDOMEN PELVIS W WO CONTRAST Result Date: 03/09/2024 CLINICAL DATA:  Liver mass on right upper quadrant ultrasound, chest pain. * Tracking Code: BO * EXAM: CT CHEST WITH CONTRAST CT ABDOMEN WITHOUT AND WITH CONTRAST TECHNIQUE: Multidetector CT imaging of the abdomen was performed without intravenous contrast. Multidetector CT imaging of the chest and abdomen was then performed during bolus administration of intravenous contrast. RADIATION DOSE REDUCTION: This exam was performed according to the departmental dose-optimization program which includes automated exposure control, adjustment of the mA and/or kV according to patient size and/or use of iterative reconstruction technique. CONTRAST:  75mL OMNIPAQUE  IOHEXOL  350 MG/ML SOLN COMPARISON:  Ultrasound abdomen from earlier the same day. FINDINGS: CT CHEST FINDINGS Cardiovascular: Normal cardiac size. No pericardial effusion. No aortic aneurysm. There are coronary artery calcifications, in keeping with coronary artery disease. Mediastinum/Nodes: Visualized thyroid  gland appears grossly unremarkable. No solid / cystic mediastinal  masses. The esophagus is nondistended precluding optimal assessment. No axillary, mediastinal or hilar lymphadenopathy by size criteria. Lungs/Pleura: The central tracheo-bronchial tree is patent. There are dependent changes in bilateral lungs. No mass or consolidation. No pleural effusion or pneumothorax. No suspicious lung nodules. Musculoskeletal: The visualized soft tissues of the chest wall are grossly unremarkable. No suspicious osseous lesions. CT ABDOMEN PELVIS FINDINGS Hepatobiliary: The liver is normal in size. There is liver surface irregularity/nodularity, suggesting cirrhosis cirrhotic appearance due to underlying liver lesions described as follows. There are multiple (more than 15), ill-defined hypoattenuating masses throughout the liver with largest in the left hepatic lobe, segment 4 B/3 junction region measuring up to 3.0 x 3.2 cm. No intrahepatic or extrahepatic bile duct dilation. No calcified gallstones. Normal gallbladder wall thickness. No pericholecystic inflammatory changes. Pancreas: There is an ill-defined, hypoenhancing, approximately 1.6 x 1.9 cm lesion in the pancreatic head, which is highly concerning for pancreatic malignancy. There is resultant moderate upstream main pancreatic duct dilation, measuring up to 8 mm in the neck region. No peripancreatic fat stranding. There is resultant up to moderate narrowing of the portal vein at the level of confluence of SMV/splenic veins (series 7, image 66). Rest of the main portal vein and its tributaries are otherwise normal in caliber and patent. Unremarkable hepatic veins. There is subtle fat stranding surrounding the pancreatic head/uncinate process and neck region, which is nonspecific but can be seen with acute interstitial pancreatitis. Correlate clinically and with serum lipase levels. Spleen: Within normal limits. No focal lesion. Adrenals/Urinary Tract: There are bilateral adrenal adenomas. On the left it measures up to 1.0 x 1.6 cm and  on the right it measures up to 0.9 x 1.2 cm. No suspicious renal mass. There is a 1.3 x 1.6 cm simple cyst arising from the left kidney  upper pole. There is a 6 x 10 mm nonobstructing calculus in the left kidney upper pole and an additional 2 mm nonobstructing calculus in the left kidney lower pole. No other nephroureterolithiasis on either side. No obstructive uropathy on either side. Urinary bladder is under distended, precluding optimal assessment. However, no large mass or stones identified. No perivesical fat stranding. Stomach/Bowel: No disproportionate dilation of the small or large bowel loops. No evidence of abnormal bowel wall thickening. There is subtle fat stranding surrounding the proximal duodenum, which is nonspecific but typically seen as sequela of pancreatitis or duodenitis. Correlate clinically. The appendix is unremarkable. Vascular/Lymphatic: No ascites or pneumoperitoneum. No abdominal or pelvic lymphadenopathy, by size criteria. No aneurysmal dilation of the major abdominal arteries. Reproductive: Normal size prostate. Symmetric seminal vesicles. Other: There is a tiny fat containing umbilical hernia. There are bilateral small fat containing inguinal hernias. Musculoskeletal: No suspicious osseous lesions. There are mild multilevel degenerative changes in the visualized spine. IMPRESSION: 1. There is an ill-defined, hypoenhancing, approximately 1.6 x 1.9 cm lesion in the pancreatic head, which is highly concerning for pancreatic malignancy. There is resultant moderate upstream main pancreatic duct dilation, measuring up to 8 mm in the neck region. There is also resultant up to moderate narrowing of the portal vein at the level of confluence of SMV/splenic veins. 2. There are multiple (more than 15), ill-defined hypoattenuating masses throughout the liver with largest in the left hepatic lobe, segment 4B/3 junction region measuring up to 3.0 x 3.2 cm. These are compatible with metastases. 3.  There is subtle fat stranding surrounding the pancreatic head/uncinate process and neck region, which is nonspecific but can be seen with acute interstitial pancreatitis. There is subtle fat stranding surrounding the proximal duodenum, which is typically seen as sequela of pancreatitis or duodenitis. 4. No metastatic disease identified within the chest. 5. Multiple other nonacute observations, as described above. Aortic Atherosclerosis (ICD10-I70.0). Electronically Signed   By: Ree Molt M.D.   On: 03/09/2024 10:17   CT CHEST W CONTRAST Result Date: 03/09/2024 CLINICAL DATA:  Liver mass on right upper quadrant ultrasound, chest pain. * Tracking Code: BO * EXAM: CT CHEST WITH CONTRAST CT ABDOMEN WITHOUT AND WITH CONTRAST TECHNIQUE: Multidetector CT imaging of the abdomen was performed without intravenous contrast. Multidetector CT imaging of the chest and abdomen was then performed during bolus administration of intravenous contrast. RADIATION DOSE REDUCTION: This exam was performed according to the departmental dose-optimization program which includes automated exposure control, adjustment of the mA and/or kV according to patient size and/or use of iterative reconstruction technique. CONTRAST:  75mL OMNIPAQUE  IOHEXOL  350 MG/ML SOLN COMPARISON:  Ultrasound abdomen from earlier the same day. FINDINGS: CT CHEST FINDINGS Cardiovascular: Normal cardiac size. No pericardial effusion. No aortic aneurysm. There are coronary artery calcifications, in keeping with coronary artery disease. Mediastinum/Nodes: Visualized thyroid  gland appears grossly unremarkable. No solid / cystic mediastinal masses. The esophagus is nondistended precluding optimal assessment. No axillary, mediastinal or hilar lymphadenopathy by size criteria. Lungs/Pleura: The central tracheo-bronchial tree is patent. There are dependent changes in bilateral lungs. No mass or consolidation. No pleural effusion or pneumothorax. No suspicious lung  nodules. Musculoskeletal: The visualized soft tissues of the chest wall are grossly unremarkable. No suspicious osseous lesions. CT ABDOMEN PELVIS FINDINGS Hepatobiliary: The liver is normal in size. There is liver surface irregularity/nodularity, suggesting cirrhosis cirrhotic appearance due to underlying liver lesions described as follows. There are multiple (more than 15), ill-defined hypoattenuating masses throughout the liver with largest in  the left hepatic lobe, segment 4 B/3 junction region measuring up to 3.0 x 3.2 cm. No intrahepatic or extrahepatic bile duct dilation. No calcified gallstones. Normal gallbladder wall thickness. No pericholecystic inflammatory changes. Pancreas: There is an ill-defined, hypoenhancing, approximately 1.6 x 1.9 cm lesion in the pancreatic head, which is highly concerning for pancreatic malignancy. There is resultant moderate upstream main pancreatic duct dilation, measuring up to 8 mm in the neck region. No peripancreatic fat stranding. There is resultant up to moderate narrowing of the portal vein at the level of confluence of SMV/splenic veins (series 7, image 66). Rest of the main portal vein and its tributaries are otherwise normal in caliber and patent. Unremarkable hepatic veins. There is subtle fat stranding surrounding the pancreatic head/uncinate process and neck region, which is nonspecific but can be seen with acute interstitial pancreatitis. Correlate clinically and with serum lipase levels. Spleen: Within normal limits. No focal lesion. Adrenals/Urinary Tract: There are bilateral adrenal adenomas. On the left it measures up to 1.0 x 1.6 cm and on the right it measures up to 0.9 x 1.2 cm. No suspicious renal mass. There is a 1.3 x 1.6 cm simple cyst arising from the left kidney upper pole. There is a 6 x 10 mm nonobstructing calculus in the left kidney upper pole and an additional 2 mm nonobstructing calculus in the left kidney lower pole. No other  nephroureterolithiasis on either side. No obstructive uropathy on either side. Urinary bladder is under distended, precluding optimal assessment. However, no large mass or stones identified. No perivesical fat stranding. Stomach/Bowel: No disproportionate dilation of the small or large bowel loops. No evidence of abnormal bowel wall thickening. There is subtle fat stranding surrounding the proximal duodenum, which is nonspecific but typically seen as sequela of pancreatitis or duodenitis. Correlate clinically. The appendix is unremarkable. Vascular/Lymphatic: No ascites or pneumoperitoneum. No abdominal or pelvic lymphadenopathy, by size criteria. No aneurysmal dilation of the major abdominal arteries. Reproductive: Normal size prostate. Symmetric seminal vesicles. Other: There is a tiny fat containing umbilical hernia. There are bilateral small fat containing inguinal hernias. Musculoskeletal: No suspicious osseous lesions. There are mild multilevel degenerative changes in the visualized spine. IMPRESSION: 1. There is an ill-defined, hypoenhancing, approximately 1.6 x 1.9 cm lesion in the pancreatic head, which is highly concerning for pancreatic malignancy. There is resultant moderate upstream main pancreatic duct dilation, measuring up to 8 mm in the neck region. There is also resultant up to moderate narrowing of the portal vein at the level of confluence of SMV/splenic veins. 2. There are multiple (more than 15), ill-defined hypoattenuating masses throughout the liver with largest in the left hepatic lobe, segment 4B/3 junction region measuring up to 3.0 x 3.2 cm. These are compatible with metastases. 3. There is subtle fat stranding surrounding the pancreatic head/uncinate process and neck region, which is nonspecific but can be seen with acute interstitial pancreatitis. There is subtle fat stranding surrounding the proximal duodenum, which is typically seen as sequela of pancreatitis or duodenitis. 4. No  metastatic disease identified within the chest. 5. Multiple other nonacute observations, as described above. Aortic Atherosclerosis (ICD10-I70.0). Electronically Signed   By: Ree Molt M.D.   On: 03/09/2024 10:17   US  Abdomen Limited RUQ (LIVER/GB) Result Date: 03/09/2024 CLINICAL DATA:  [id number].  Elevated liver enzymes. EXAM: ULTRASOUND ABDOMEN LIMITED RIGHT UPPER QUADRANT COMPARISON:  Ultrasound complete abdomen from 12/23/2018. FINDINGS: Gallbladder: No gallstones or wall thickening visualized. The gallbladder is mildly dilated measuring 11 cm in length.  There is a small amount of hypoechoic layering sludge. No sonographic Murphy sign noted by sonographer. Common bile duct: Diameter: Measures prominent at 7.4 mm mild intrahepatic biliary prominence. Liver: Not seen in 2020, there is a heterogeneously hypoechoic solid mass with scattered color flow in the left lobe measuring 3.5 x 2.9 x 3.3 cm, worrisome for primary or metastatic neoplasm. CT or MRI recommended preferably without and with contrast. Otherwise, the liver is within normal limits in parenchymal echogenicity. Portal vein is patent on color Doppler imaging with normal direction of blood flow towards the liver. Other: No right upper quadrant ascites. IMPRESSION: 1. 3.5 x 2.9 x 3.3 cm heterogeneously hypoechoic solid mass in the left lobe of the liver, worrisome for primary or metastatic neoplasm. CT or MRI recommended preferably without and with contrast. 2. Mildly dilated gallbladder with a small amount of sludge. No gallstones or wall thickening. 3. Prominent common bile duct at 7.4 mm with mild intrahepatic biliary prominence. Electronically Signed   By: Francis Quam M.D.   On: 03/09/2024 07:46   DG Ribs Unilateral W/Chest Left Result Date: 03/09/2024 CLINICAL DATA:  53 year old male with back and rib pain for 1 month. EXAM: LEFT RIBS AND CHEST - 3+ VIEW COMPARISON:  None Available. FINDINGS: PA view of the chest 0457 hours. Low normal  lung volumes. Normal cardiac size and mediastinal contours. Visualized tracheal air column is within normal limits. Both lungs appear clear. No pneumothorax or pleural effusion. Four oblique views of the left ribs. Bone mineralization is within normal limits. 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 EKG leads project over the left chest. No left rib fracture or rib lesion identified. Other visible osseous structures appear intact. Negative visible bowel gas pattern. However, left abdominal oval, crescent-shaped 13 mm calculus is visible on multiple views, suspicious for left nephrolithiasis. IMPRESSION: 1. No acute cardiopulmonary abnormality.  Negative left radiographs. 2. Evidence of bulky 13 mm left renal calculus.  Query renal colic. Electronically Signed   By: VEAR Hurst M.D.   On: 03/09/2024 05:32   Pancreas cancer, stage IV (cT1c, cN0, pM1) 03/09/2024 CTs chest, abdomen, and pelvis: 1.6 x 1.9 cm pancreas head mass, upstream pancreatic duct dilation, multiple hypoattenuating liver lesions, fat stranding surrounding the pancreas head and proximal duodenum, no metastatic disease in the chest Ultrasound-guided biopsy of left liver mass 03/11/2024: Invasive moderately differentiated pancreatic adenocarcinoma, pancreas with severe ductal dysplasia (PanIN-3), no hepatic parenchyma is present.  MSS, HRD signature negative, tumor mutation burden 0, KRAS G 12R, Elevated CA 19-9 Elevated liver enzymes and bilirubin Progressive rise in liver enzymes and bilirubin 03/24/2024 CTs 03/24/2024-increase in mild to moderate biliary duct dilatation; pancreatic head/uncinate process mass and liver metastasis relatively similar 03/25/2024: Biliary stricture with upstream dilation, head of pancreas with double duct sign uncovered metal stent 03/26/2024 ultrasound biopsy of right liver lesion: Metastatic moderately differentiated adenocarcinoma consistent with a pancreas primary 03/27/2024 CT abdomen/pelvis: Interval placement of a biliary  stent extending into the cystic duct, not clearly communicating with the common duct occiput to the obstructing mass.,  Stable biliary duct dilation, stable hepatic metastases and pancreas head mass, development of mild peripancreatic inflammatory stranding Pain secondary to #1 Hypothyroidism Family history of colon cancer Microcytic anemia 03/24/2024 Hemoglobin electrophoresis: 94.6% A, 5.4% A2 Hyperthyroidism status post RAI in 2009 Gastroesophageal reflux disease Glaucoma Hypertension Hospital admission 03/24/2024 with obstructive jaundice, status post common duct stent placement Hospital admission 03/27/2024 with post ERCP pancreatitis, fever migration of bile duct stent to the cystic duct, increased abdominal  pain   Mr. Gregson has metastatic pancreas cancer.  I discussed the liver biopsy result with him today.  Discussed the results of NGS testing.  The plan is to begin systemic chemotherapy with FOLFIRINOX when the hyperbilirubinemia has resolved.  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  We discussed the diagnosis of thalassemia minor.  No treatment is needed.  He is now admitted with persistent jaundice and pancreatitis.  He is scheduled undergo repeat ERCP today.  He has a fever, potentially related to pancreatitis, cholangitis, or tumor fever.  Recommendations: ERCP, management of obstructive jaundice, fever, and pancreatitis per gastroenterology and the medical service Use Port-A-Cath if additional IV access is needed Narcotic analgesics for pain Please call oncology as needed, I will check on him 03/31/2024 if he remains in the hospital.  Outpatient follow-up is scheduled at the Cancer center

## 2024-03-29 DIAGNOSIS — K9189 Other postprocedural complications and disorders of digestive system: Secondary | ICD-10-CM | POA: Diagnosis not present

## 2024-03-29 DIAGNOSIS — K859 Acute pancreatitis without necrosis or infection, unspecified: Secondary | ICD-10-CM | POA: Diagnosis not present

## 2024-03-29 DIAGNOSIS — K8309 Other cholangitis: Secondary | ICD-10-CM

## 2024-03-29 LAB — CBC
HCT: 26.8 % — ABNORMAL LOW (ref 39.0–52.0)
Hemoglobin: 8.8 g/dL — ABNORMAL LOW (ref 13.0–17.0)
MCH: 19.6 pg — ABNORMAL LOW (ref 26.0–34.0)
MCHC: 32.8 g/dL (ref 30.0–36.0)
MCV: 59.8 fL — ABNORMAL LOW (ref 80.0–100.0)
Platelets: 149 K/uL — ABNORMAL LOW (ref 150–400)
RBC: 4.48 MIL/uL (ref 4.22–5.81)
RDW: 15.8 % — ABNORMAL HIGH (ref 11.5–15.5)
WBC: 8.4 K/uL (ref 4.0–10.5)
nRBC: 0 % (ref 0.0–0.2)

## 2024-03-29 LAB — BLOOD CULTURE ID PANEL (REFLEXED) - BCID2

## 2024-03-29 LAB — COMPREHENSIVE METABOLIC PANEL WITH GFR
ALT: 238 U/L — ABNORMAL HIGH (ref 0–44)
AST: 72 U/L — ABNORMAL HIGH (ref 15–41)
Albumin: 3.1 g/dL — ABNORMAL LOW (ref 3.5–5.0)
Alkaline Phosphatase: 217 U/L — ABNORMAL HIGH (ref 38–126)
Anion gap: 8 (ref 5–15)
BUN: 18 mg/dL (ref 6–20)
CO2: 27 mmol/L (ref 22–32)
Calcium: 9 mg/dL (ref 8.9–10.3)
Chloride: 102 mmol/L (ref 98–111)
Creatinine, Ser: 1.32 mg/dL — ABNORMAL HIGH (ref 0.61–1.24)
GFR, Estimated: >60 mL/min (ref >60)
Glucose, Bld: 122 mg/dL — ABNORMAL HIGH (ref 70–99)
Potassium: 4.1 mmol/L (ref 3.5–5.1)
Sodium: 137 mmol/L (ref 135–145)
Total Bilirubin: 14.1 mg/dL — ABNORMAL HIGH (ref 0.0–1.2)
Total Protein: 6 g/dL — ABNORMAL LOW (ref 6.5–8.1)

## 2024-03-29 LAB — LIPASE, BLOOD: Lipase: 75 U/L — ABNORMAL HIGH (ref 11–51)

## 2024-03-29 NOTE — Progress Notes (Signed)
 PROGRESS NOTE    Douglas Harper  FMW:[id number] DOB: [birth year] DOA: 03/27/2024 PCP: Medicine, [facility]    Brief Narrative:   Douglas Harper is a 53 y.o. male with past medical history significant for stage IV pancreatic cancer status post recent ERCP with biliary sphincterotomy and metal stent placement 10/7, HTN, hypothyroidism who presented to [facility] ED on 03/27/2024 with recurrent severe epigastric abdominal pain with radiation towards his back.  Denied nausea, vomiting, no fever/chills, no chest pain.  In the ED, temperature 102.9 F, HR 115, RR 20, BP 162/90, SpO2 98% on 4 L nasal cannula.  WBC 7.9, hemoglobin 11.0, platelet count 217.  Sodium 137, potassium 3.6, chloride 100, CO2 26, glucose 164, BUN 16, creatinine 1.21.  Lipase 288.  AST 164, ALT 495, total bilirubin 12.3.  Urinalysis with moderate hemoglobin, moderate bilirubin, negative leukocytes/nitrite, no bacteria, 0-5 WBCs.  Blood cultures x 2 obtained.  Chest x-ray with no active Karve pulm disease process.  CT abdomen/pelvis with interval placement palliative metallic internal biliary stent transversing the ampulla and extending into the cystic duct, does not clearly communicate with the common duct proximal to the obstructing mass, intra/extrahepatic biliary ductal dilation, stable by lobar hepatic metastatic disease, 4.7 cm hypoenhancing mass at the end of the pancreas, mild peripancreatic inflammatory stranding consistent with mild pancreatitis without loculated fluid collection, no peripancreatic necrosis noted, stable encasement and marked narrowing main portal vein, mild hepatic steatosis, minimal left nonobstructing nephrolithiasis.  GI consulted.  TR[facility] consulted for admission for further evaluation and management of obstructive jaundice likely secondary to stent migration, acute pancreatitis.  Assessment & Plan:   Obstructive jaundice Hx stage IV pancreatic adenocarcinoma Concern for cholangitis secondary to biliary  obstruction Patient presenting to the ED with recurrent epigastric abdominal pain radiating toward his back.  Newly diagnosed 03/09/2024 and follows with oncology; Dr. Cloretta outpatient.  Recently had ERCP with biliary sphincterotomy and stent placement by GI, Dr. Abran on 03/25/2024.  Repeat imaging on admission notable for stent migration and now are not clearly communicating with the common duct proximal to the obstructing mass.  Also febrile 102.7 at time of admission without leukocytosis.  Noted elevated LFTs, total bilirubin. -- [facility] following; appreciate assistance -- AST 164>110>72 -- ALT 495>352>238 -- Tbili 12.3>16.5>14.1 -- Zofran  4 mg IV every 6 hours.  Nausea/vomiting -- Oxycodone  10 mg p.o. every 4 hours as needed moderate pain -- Dilaudid  1 mg IV every 2 hours as needed severe pain -- CMP daily  GNR bacteremia Blood cultures 2 out of 4 from left arm positive for gram-negative rods.  BC ID positive for Enterobacterales.  -- Continue IV Zosyn 3.375 g IV every 8 hours -- Fever curve improved -- Awaiting blood culture identification/susceptibilities  Acute pancreatitis Patient complaining of epigastric pain complicated by obstructive jaundice as above.  Lipase elevated to 88.  CT imaging notable for mild peripancreatic stranding consistent with pancreatitis. -- Supportive care, pain control, LR at 150 mL/h -- CMP, repeat lipase in a.m.  HTN -- Amlodipine  10 mg p.o. daily  Hypothyroidism -- Levothyroxine  125 mcg p.o. daily  Anemia, microcytic Hemoglobin 10.1; stable  DVT prophylaxis: enoxaparin (LOVENOX) injection 40 mg Start: 03/27/24 2000    Code Status: Full Code Family Communication: No family present at bedside this morning  Disposition Plan:  Level of care: Progressive Status is: Inpatient Remains inpatient appropriate because: IV antibiotics, pending ERCP with stent replacement per GI for obstructive jaundice    Consultants:  [place] [facility], Dr. Abran Medical oncology, Dr. Cloretta  Procedures:  ERCP: Pending  Antimicrobials:  Zosyn 10/9>>   Subjective: Patient seen examined bedside, sitting in bedside chair.  Feels much better today after stent was replaced yesterday by GI.  LFTs/total bilirubin trending down.  Patient now with positive blood cultures 2 out of 4 with GNR's; further identification susceptibilities pending.  Remains on IV Zosyn.  Fever curve improved.  Discussed with patient needs to her maintain on IV antibiotics until cultures finalize.  Appreciative of the care he is receiving.  No other complaints or concerns at this time.  Denies headache, no dizziness, no current fever/chills, no nausea/vomiting/diarrhea, no chest pain, no palpitations, no focal weakness, no cough/congestion, no paresthesia.  No acute events overnight per nursing staff.  Objective: Vitals:   03/28/24 1529 03/28/24 2152 03/29/24 0523 03/29/24 1025  BP: (!) 155/79 122/85 120/82 117/75  Pulse: (!) 101 81 75   Resp: 18 18 18    Temp: 99.1 F (37.3 C) 98.1 F (36.7 C) 97.9 F (36.6 C)   TempSrc: Oral Oral Oral   SpO2: 94% 98% 97%   Weight:      Height:        Intake/Output Summary (Last 24 hours) at 03/29/2024 1159 Last data filed at 03/29/2024 1036 Gross per 24 hour  Intake 3481.56 ml  Output 0 ml  Net 3481.56 ml   Filed Weights   03/27/24 1722 03/28/24 1236  Weight: 91.8 kg 91.8 kg    Examination:  Physical Exam: GEN: NAD, alert and oriented x 3, wd/wn HEENT: NCAT, PERRL, EOMI, sclera clear, MMM PULM: CTAB w/o wheezes/crackles, normal respiratory effort, on room air CV: RRR w/o M/G/R GI: abd soft, nondistended, NTTP, + BS MSK: no peripheral edema, moves all extremities independently NEURO: No focal neurological deficit PSYCH: normal mood/affect Integumentary: dry/intact, no rashes or wounds    Data Reviewed: I have personally reviewed following labs and imaging studies  CBC: Recent  Labs  Lab 03/24/24 0845 03/24/24 1852 03/25/24 0530 03/26/24 1330 03/27/24 0209 03/28/24 0510 03/29/24 0522  WBC 4.9   < > 4.8 5.9 7.9 9.1 8.4  NEUTROABS 3.6  --   --  4.4 6.5  --   --   HGB 10.8*   < > 10.3* 10.2* 11.0* 10.1* 8.8*  HCT 32.4*   < > 32.6* 31.7* 34.6* 31.6* 26.8*  MCV 60.9*   < > 61.0* 60.5* 60.7* 60.5* 59.8*  PLT 216   < > 209 212 217 172 149*   < > = values in this interval not displayed.   Basic Metabolic Panel: Recent Labs  Lab 03/25/24 0530 03/26/24 1330 03/27/24 0209 03/28/24 0510 03/29/24 0522  NA 139 138 137 138 137  K 3.9 3.9 3.6 4.0 4.1  CL 104 102 100 101 102  CO2 25 25 26 26 27   GLUCOSE 102* 109* 164* 114* 122*  BUN 14 16 16 16 18   CREATININE 1.05 1.25* 1.21 1.48* 1.32*  CALCIUM 9.4 9.5 9.4 9.0 9.0  MG  --  1.9  --   --   --    GFR: Estimated Creatinine Clearance: 78.5 mL/min (A) (by C-G formula based on SCr of 1.32 mg/dL (H)). Liver Function Tests: Recent Labs  Lab 03/25/24 0530 03/26/24 1330 03/27/24 0209 03/28/24 0510 03/29/24 0522  AST 246* 178* 164* 110* 72*  ALT 623* 497* 495* 352* 238*  ALKPHOS 319* 305* 316* 270* 217*  BILITOT 11.3* 11.1* 12.3* 16.5* 14.1*  PROT 6.9 6.9  7.0 6.7 6.0*  ALBUMIN 3.9 3.6 3.9 3.6 3.1*   Recent Labs  Lab 03/27/24 0209 03/29/24 0522  LIPASE 288* 75*   No results for input(s): AMMONIA in the last 168 hours. Coagulation Profile: Recent Labs  Lab 03/24/24 1648  INR 1.0   Cardiac Enzymes: No results for input(s): CKTOTAL, CKMB, CKMBINDEX, TROPONINI in the last 168 hours. BNP (last 3 results) No results for input(s): PROBNP in the last 8760 hours. HbA1C: No results for input(s): HGBA1C in the last 72 hours. CBG: Recent Labs  Lab 03/25/24 2140  GLUCAP 268*   Lipid Profile: No results for input(s): CHOL, HDL, LDLCALC, TRIG, CHOLHDL, LDLDIRECT in the last 72 hours. Thyroid  Function Tests: No results for input(s): TSH, T4TOTAL, FREET4, T3FREE,  THYROIDAB in the last 72 hours. Anemia Panel: No results for input(s): VITAMINB12, FOLATE, FERRITIN, TIBC, IRON, RETICCTPCT in the last 72 hours. Sepsis Labs: No results for input(s): PROCALCITON, LATICACIDVEN in the last 168 hours.  Recent Results (from the past 240 hours)  Culture, blood (Routine X 2) w Reflex to ID Panel     Status: None (Preliminary result)   Collection Time: 03/27/24 11:30 AM   Specimen: BLOOD LEFT FOREARM  Result Value Ref Range Status   Specimen Description   Final    BLOOD LEFT FOREARM Performed at [facility], [address] [place], [place] [address]    Special Requests   Final    BOTTLES DRAWN AEROBIC AND ANAEROBIC Blood Culture results may not be optimal due to an inadequate volume of blood received in culture bottles Performed at [facility], [address] [place], [place] [address]    Culture  Setup Time   Final    GRAM NEGATIVE RODS IN BOTH AEROBIC AND ANAEROBIC BOTTLES CRITICAL RESULT CALLED TO, READ BACK BY AND VERIFIED WITH: ROSALINE HINT PHARM D 03/29/2024 0441 BY DD Performed at [facility], [address] [place], [place] [address]    Culture GRAM NEGATIVE RODS  Final   Report Status PENDING  Incomplete  Blood Culture ID Panel (Reflexed)     Status: Abnormal   Collection Time: 03/27/24 11:30 AM  Result Value Ref Range Status   Enterococcus faecalis NOT DETECTED NOT DETECTED Final   Enterococcus Faecium NOT DETECTED NOT DETECTED Final   Listeria monocytogenes NOT DETECTED NOT DETECTED Final   Staphylococcus species NOT DETECTED NOT DETECTED Final   Staphylococcus aureus (BCID) NOT DETECTED NOT DETECTED Final   Staphylococcus epidermidis NOT DETECTED NOT DETECTED Final   Staphylococcus lugdunensis NOT DETECTED NOT DETECTED Final   Streptococcus species NOT DETECTED NOT DETECTED Final   Streptococcus agalactiae NOT DETECTED NOT DETECTED Final   Streptococcus pneumoniae NOT DETECTED  NOT DETECTED Final   Streptococcus pyogenes NOT DETECTED NOT DETECTED Final   A.calcoaceticus-baumannii NOT DETECTED NOT DETECTED Final   Bacteroides fragilis NOT DETECTED NOT DETECTED Final   Enterobacterales DETECTED (A) NOT DETECTED Final    Comment: Enterobacterales represent a large order of gram negative bacteria, not a single organism. Refer to culture for further identification. CRITICAL RESULT CALLED TO, READ BACK BY AND VERIFIED WITH: MICHELLE LILLYSTONE PHARM D 03/29/2024 0441 BY DD    Enterobacter cloacae complex NOT DETECTED NOT DETECTED Final   Escherichia coli NOT DETECTED NOT DETECTED Final   Klebsiella aerogenes NOT DETECTED NOT DETECTED Final   Klebsiella oxytoca NOT DETECTED NOT DETECTED Final   Klebsiella pneumoniae NOT DETECTED NOT DETECTED Final   Proteus species NOT DETECTED NOT  DETECTED Final   Salmonella species NOT DETECTED NOT DETECTED Final   Serratia marcescens NOT DETECTED NOT DETECTED Final   Haemophilus influenzae NOT DETECTED NOT DETECTED Final   Neisseria meningitidis NOT DETECTED NOT DETECTED Final   Pseudomonas aeruginosa NOT DETECTED NOT DETECTED Final   Stenotrophomonas maltophilia NOT DETECTED NOT DETECTED Final   Candida albicans NOT DETECTED NOT DETECTED Final   Candida auris NOT DETECTED NOT DETECTED Final   Candida glabrata NOT DETECTED NOT DETECTED Final   Candida krusei NOT DETECTED NOT DETECTED Final   Candida parapsilosis NOT DETECTED NOT DETECTED Final   Candida tropicalis NOT DETECTED NOT DETECTED Final   Cryptococcus neoformans/gattii NOT DETECTED NOT DETECTED Final   CTX-M ESBL NOT DETECTED NOT DETECTED Final   Carbapenem resistance IMP NOT DETECTED NOT DETECTED Final   Carbapenem resistance KPC NOT DETECTED NOT DETECTED Final   Carbapenem resistance NDM NOT DETECTED NOT DETECTED Final   Carbapenem resist OXA 48 LIKE NOT DETECTED NOT DETECTED Final   Carbapenem resistance VIM NOT DETECTED NOT DETECTED Final    Comment: Performed at  [facility], [address], [place], [place] [address]  Culture, blood (Routine X 2) w Reflex to ID Panel     Status: None (Preliminary result)   Collection Time: 03/27/24 11:35 AM   Specimen: BLOOD LEFT FOREARM  Result Value Ref Range Status   Specimen Description   Final    BLOOD LEFT FOREARM Performed at [facility], [address], [place], [place] [address]    Special Requests   Final    BOTTLES DRAWN AEROBIC AND ANAEROBIC Blood Culture results may not be optimal due to an inadequate volume of blood received in culture bottles Performed at [facility], [address], [place], [place] [address]    Culture   Final    NO GROWTH 2 DAYS Performed at [facility], [address] [place], [place] [address]    Report Status PENDING  Incomplete         Radiology Studies: No results found.       Scheduled Meds:  amLODipine   10 mg Oral Daily   enoxaparin (LOVENOX) injection  40 mg Subcutaneous Q24H   indomethacin  100 mg Rectal Once   levothyroxine   125 mcg Oral QAC breakfast   pantoprazole   40 mg Oral Daily   Continuous Infusions:  piperacillin-tazobactam (ZOSYN)  IV 3.375 g (03/29/24 0852)     LOS: 2 days    Time spent: 48 minutes spent on 03/29/2024 caring for this patient face-to-face including chart review, ordering labs/tests, documenting, discussion with nursing staff, consultants, updating family and interview/physical exam    Camellia PARAS [place], DO [facility] Available via Epic secure chat 7am-7pm After these hours, please refer to coverage provider listed on [email or web address] 03/29/2024, 11:59 AM

## 2024-03-29 NOTE — Progress Notes (Addendum)
 PHARMACY - PHYSICIAN COMMUNICATION CRITICAL VALUE ALERT - BLOOD CULTURE IDENTIFICATION (BCID)  Douglas Harper is an 53 y.o. male who presented to [facility] on 03/27/2024 with a chief complaint of mid epigastric area pain after biliary duct stent placement.   Assessment:  Hx stage IV pancreatic adenocarcinoma Concern for cholangitis secondary to biliary obstruction- migration of stent placed on 10/7. Pt now afebrile, VSS.  TBili increasing, AST/ALT elevated but improving.  Micro reports 2/4 blood cx (one complete set) +GNR; BCID +enterobacterales but no specific organism identified.    Name of physician (or Provider) Contacted: DELENA Horns, NP  Current antibiotics: Zosyn  Changes to prescribed antibiotics recommended:  Patient is on recommended antibiotics - No changes needed F/U final cx results   Results for orders placed or performed during the hospital encounter of 03/27/24  Blood Culture ID Panel (Reflexed) (Collected: 03/27/2024 11:30 AM)  Result Value Ref Range   Enterococcus faecalis NOT DETECTED NOT DETECTED   Enterococcus Faecium NOT DETECTED NOT DETECTED   Listeria monocytogenes NOT DETECTED NOT DETECTED   Staphylococcus species NOT DETECTED NOT DETECTED   Staphylococcus aureus (BCID) NOT DETECTED NOT DETECTED   Staphylococcus epidermidis NOT DETECTED NOT DETECTED   Staphylococcus lugdunensis NOT DETECTED NOT DETECTED   Streptococcus species NOT DETECTED NOT DETECTED   Streptococcus agalactiae NOT DETECTED NOT DETECTED   Streptococcus pneumoniae NOT DETECTED NOT DETECTED   Streptococcus pyogenes NOT DETECTED NOT DETECTED   A.calcoaceticus-baumannii NOT DETECTED NOT DETECTED   Bacteroides fragilis NOT DETECTED NOT DETECTED   Enterobacterales DETECTED (A) NOT DETECTED   Enterobacter cloacae complex NOT DETECTED NOT DETECTED   Escherichia coli NOT DETECTED NOT DETECTED   Klebsiella aerogenes NOT DETECTED NOT DETECTED   Klebsiella oxytoca NOT DETECTED NOT DETECTED    Klebsiella pneumoniae NOT DETECTED NOT DETECTED   Proteus species NOT DETECTED NOT DETECTED   Salmonella species NOT DETECTED NOT DETECTED   Serratia marcescens NOT DETECTED NOT DETECTED   Haemophilus influenzae NOT DETECTED NOT DETECTED   Neisseria meningitidis NOT DETECTED NOT DETECTED   Pseudomonas aeruginosa NOT DETECTED NOT DETECTED   Stenotrophomonas maltophilia NOT DETECTED NOT DETECTED   Candida albicans NOT DETECTED NOT DETECTED   Candida auris NOT DETECTED NOT DETECTED   Candida glabrata NOT DETECTED NOT DETECTED   Candida krusei NOT DETECTED NOT DETECTED   Candida parapsilosis NOT DETECTED NOT DETECTED   Candida tropicalis NOT DETECTED NOT DETECTED   Cryptococcus neoformans/gattii NOT DETECTED NOT DETECTED   CTX-M ESBL NOT DETECTED NOT DETECTED   Carbapenem resistance IMP NOT DETECTED NOT DETECTED   Carbapenem resistance KPC NOT DETECTED NOT DETECTED   Carbapenem resistance NDM NOT DETECTED NOT DETECTED   Carbapenem resist OXA 48 LIKE NOT DETECTED NOT DETECTED   Carbapenem resistance VIM NOT DETECTED NOT DETECTED    Rosaline Millet PharmD 03/29/2024  4:43 AM

## 2024-03-29 NOTE — Progress Notes (Signed)
 HISTORY OF PRESENT ILLNESS:  Douglas Harper is a 53 y.o. male with metastatic pancreatic cancer complicated by malignant obstructive jaundice.  Underwent ERCP yesterday with stent replacement.  Feeling well today.  And evidence of cholangitis.  Blood work stable to improved.  No fever overnight.  Hungry.  Brother-in-law in room.  REVIEW OF SYSTEMS:  All non-GI ROS negative. Past Medical History:  Diagnosis Date   COVID-19    03/17/20   FH: heart disease    GERD (gastroesophageal reflux disease)    Glaucoma    Hypertension    Hyperthyroidism    s/p RAI 2009    Hypothyroidism    Verruca vulgaris     Past Surgical History:  Procedure Laterality Date   ANTERIOR CRUCIATE LIGAMENT REPAIR Right 2000   ERCP N/A 03/25/2024   Procedure: ERCP, WITH INTERVENTION IF INDICATED;  Surgeon: Abran Norleen SAILOR, MD;  Location: [facility];  Service: Gastroenterology;  Laterality: N/A;   HAND SURGERY Right 2001   with hardware implanted   IR IMAGING GUIDED PORT INSERTION  03/17/2024   NO PAST SURGERIES      Social History Douglas Harper  reports that he has never smoked. He has never used smokeless tobacco. He reports current alcohol use. He reports that he does not use drugs.  family history includes Diabetes in his mother; Heart disease in his mother; Hypertension in his mother.  No Known Allergies     PHYSICAL EXAMINATION: Vital signs: BP (!) 124/93 (BP Location: Right Arm)   Pulse 73   Temp 99.1 F (37.3 C) (Oral)   Resp 18   Ht 6' 4 (1.93 m)   Wt 91.8 kg   SpO2 99%   BMI 24.63 kg/m  General: Well-developed, well-nourished, no acute distress HEENT: Sclerae are deeply icteric, conjunctiva pink. Oral mucosa intact Lungs: Clear Heart: Regular Abdomen: soft, nontender, nondistended, no obvious ascites, no peritoneal signs, normal bowel sounds. No organomegaly. Extremities: No edema Psychiatric: alert and oriented x3. Cooperative     ASSESSMENT:  1.  Obstructive jaundice secondary  to the pancreatic cancer.  Status post ERCP with stent exchange.  Stent now in good position 2.  Cholangitis.  On antibiotics   PLAN:  1.  Continue IV antibiotics 2.  Regular diet 3.  Recheck labs tomorrow 4.  If doing well tomorrow (improving labs, no complaints of pain, no fevers) can go home.  He will need to go home on 10 days of of oral antibiotics, such as ciprofloxacin.  His follow-up would be with oncology.  Norleen SAILOR. Abran Raddle., M.D. [facility] Division of Gastroenterology

## 2024-03-30 ENCOUNTER — Other Ambulatory Visit (HOSPITAL_BASED_OUTPATIENT_CLINIC_OR_DEPARTMENT_OTHER): Payer: Self-pay

## 2024-03-30 ENCOUNTER — Encounter (HOSPITAL_COMMUNITY): Payer: Self-pay | Admitting: Internal Medicine

## 2024-03-30 DIAGNOSIS — K859 Acute pancreatitis without necrosis or infection, unspecified: Secondary | ICD-10-CM | POA: Diagnosis not present

## 2024-03-30 DIAGNOSIS — K9189 Other postprocedural complications and disorders of digestive system: Secondary | ICD-10-CM | POA: Diagnosis not present

## 2024-03-30 LAB — COMPREHENSIVE METABOLIC PANEL WITH GFR
ALT: 198 U/L — ABNORMAL HIGH (ref 0–44)
AST: 54 U/L — ABNORMAL HIGH (ref 15–41)
Albumin: 3.2 g/dL — ABNORMAL LOW (ref 3.5–5.0)
Alkaline Phosphatase: 210 U/L — ABNORMAL HIGH (ref 38–126)
Anion gap: 9 (ref 5–15)
BUN: 15 mg/dL (ref 6–20)
CO2: 26 mmol/L (ref 22–32)
Calcium: 8.9 mg/dL (ref 8.9–10.3)
Chloride: 104 mmol/L (ref 98–111)
Creatinine, Ser: 1.24 mg/dL (ref 0.61–1.24)
GFR, Estimated: >60 mL/min (ref >60)
Glucose, Bld: 123 mg/dL — ABNORMAL HIGH (ref 70–99)
Potassium: 3.7 mmol/L (ref 3.5–5.1)
Sodium: 139 mmol/L (ref 135–145)
Total Bilirubin: 11.7 mg/dL — ABNORMAL HIGH (ref 0.0–1.2)
Total Protein: 6.2 g/dL — ABNORMAL LOW (ref 6.5–8.1)

## 2024-03-30 MED ORDER — CIPROFLOXACIN HCL 500 MG PO TABS: 500.0000 mg | ORAL_TABLET | Freq: Two times a day (BID) | ORAL | 0 refills | Status: AC

## 2024-03-30 MED ORDER — CIPROFLOXACIN HCL 500 MG PO TABS
500.0000 mg | ORAL_TABLET | Freq: Two times a day (BID) | ORAL | 0 refills | Status: DC
  Filled 2024-03-30: qty 14, 7d supply, fill #0

## 2024-03-30 NOTE — Progress Notes (Signed)
 PROGRESS NOTE    Douglas Harper  FMW:[id number] DOB: 12/23/1970 DOA: 03/27/2024 PCP: Medicine, [facility]    Brief Narrative:   Douglas Harper is a 53 y.o. male with past medical history significant for stage IV pancreatic cancer status post recent ERCP with biliary sphincterotomy and metal stent placement 10/7, HTN, hypothyroidism who presented to [facility] ED on 03/27/2024 with recurrent severe epigastric abdominal pain with radiation towards his back.  Denied nausea, vomiting, no fever/chills, no chest pain.  In the ED, temperature 102.9 F, HR 115, RR 20, BP 162/90, SpO2 98% on 4 L nasal cannula.  WBC 7.9, hemoglobin 11.0, platelet count 217.  Sodium 137, potassium 3.6, chloride 100, CO2 26, glucose 164, BUN 16, creatinine 1.21.  Lipase 288.  AST 164, ALT 495, total bilirubin 12.3.  Urinalysis with moderate hemoglobin, moderate bilirubin, negative leukocytes/nitrite, no bacteria, 0-5 WBCs.  Blood cultures x 2 obtained.  Chest x-ray with no active Karve pulm disease process.  CT abdomen/pelvis with interval placement palliative metallic internal biliary stent transversing the ampulla and extending into the cystic duct, does not clearly communicate with the common duct proximal to the obstructing mass, intra/extrahepatic biliary ductal dilation, stable by lobar hepatic metastatic disease, 4.7 cm hypoenhancing mass at the end of the pancreas, mild peripancreatic inflammatory stranding consistent with mild pancreatitis without loculated fluid collection, no peripancreatic necrosis noted, stable encasement and marked narrowing main portal vein, mild hepatic steatosis, minimal left nonobstructing nephrolithiasis.  GI consulted.  TR[facility] consulted for admission for further evaluation and management of obstructive jaundice likely secondary to stent migration, acute pancreatitis.  Assessment & Plan:   Obstructive jaundice Hx stage IV pancreatic adenocarcinoma Cholangitis secondary to biliary  obstruction Patient presenting to the ED with recurrent epigastric abdominal pain radiating toward his back.  Newly diagnosed 03/09/2024 and follows with oncology; Dr. Cloretta outpatient.  Recently had ERCP with biliary sphincterotomy and stent placement by GI, Dr. Abran on 03/25/2024.  Repeat imaging on admission notable for stent migration and now are not clearly communicating with the common duct proximal to the obstructing mass.  Also febrile 102.7 at time of admission without leukocytosis.  Noted elevated LFTs, total bilirubin. -- [facility] medical oncology following; appreciate assistance -- AST 164>110>72>54 -- ALT 495>352>238>198 -- Tbili 12.3>16.5>14.1>11.7 -- Zofran  4 mg IV q6h PRN nausea/vomiting -- Continue Zosyn -- Oxycodone  10 mg p.o. every 4 hours as needed moderate pain -- Dilaudid  1 mg IV every 2 hours as needed severe pain -- CMP daily  GNR bacteremia Blood cultures 2 out of 4 from left arm positive for gram-negative rods.  BC ID positive for Enterobacterales.  -- Continue IV Zosyn 3.375 g IV every 8 hours -- Remains afebrile -- Awaiting blood culture identification/susceptibilities  Acute pancreatitis Patient complaining of epigastric pain complicated by obstructive jaundice as above.  Lipase elevated to 88.  CT imaging notable for mild peripancreatic stranding consistent with pancreatitis. -- Supportive care, pain control, LR at 150 mL/h -- CMP, repeat lipase in a.m.  HTN -- Amlodipine  10 mg p.o. daily  Hypothyroidism -- Levothyroxine  125 mcg p.o. daily  Anemia, microcytic Hemoglobin 10.1; stable  DVT prophylaxis: enoxaparin (LOVENOX) injection 40 mg Start: 03/27/24 2000    Code Status: Full Code Family Communication: No family present at bedside this morning  Disposition Plan:  Level of care: Progressive Status is: Inpatient Remains inpatient appropriate because: IV antibiotics, pending blood culture identification susceptibilities in order to  transition to oral antibiotics on discharge  Consultants:  [place] gastroenterology, Dr. Abran Medical oncology, Dr. Cloretta  Procedures:  ERCP: Pending  Antimicrobials:  Zosyn 10/9>>   Subjective: Patient seen examined bedside, lying in bed.  No specific complaints this morning.  Discussed with RN, patient has been continuing to eat only liquids at this point as he is worried about advancing diet further due to his previous abdominal pain.  Discussed with patient need to try further advancement of diet today.  Still awaiting blood culture identification and susceptibilities in order to transition IV antibiotics to oral.  Patient with no complaints or concerns at this time.  Denies headache, no dizziness, no current fever/chills, no nausea/vomiting/diarrhea, no chest pain, no palpitations, no focal weakness, no cough/congestion, no paresthesia.  No acute events overnight per nursing staff.  Objective: Vitals:   03/29/24 1025 03/29/24 1247 03/29/24 2115 03/30/24 0522  BP: 117/75 (!) 124/93 125/85 (!) 138/90  Pulse:  73 72 77  Resp:   18 20  Temp:  99.1 F (37.3 C) 98 F (36.7 C) 98.3 F (36.8 C)  TempSrc:  Oral Oral Oral  SpO2:  99% 99% 97%  Weight:      Height:        Intake/Output Summary (Last 24 hours) at 03/30/2024 1033 Last data filed at 03/29/2024 2000 Gross per 24 hour  Intake 1859.92 ml  Output --  Net 1859.92 ml   Filed Weights   03/27/24 1722 03/28/24 1236  Weight: 91.8 kg 91.8 kg    Examination:  Physical Exam: GEN: NAD, alert and oriented x 3, wd/wn HEENT: NCAT, PERRL, EOMI, sclera clear, MMM PULM: CTAB w/o wheezes/crackles, normal respiratory effort, on room air CV: RRR w/o M/G/R GI: abd soft, nondistended, NTTP, + BS MSK: no peripheral edema, moves all extremities independently NEURO: No focal neurological deficit PSYCH: normal mood/affect Integumentary: dry/intact, no rashes or wounds    Data Reviewed: I have personally reviewed following  labs and imaging studies  CBC: Recent Labs  Lab 03/24/24 0845 03/24/24 1852 03/25/24 0530 03/26/24 1330 03/27/24 0209 03/28/24 0510 03/29/24 0522  WBC 4.9   < > 4.8 5.9 7.9 9.1 8.4  NEUTROABS 3.6  --   --  4.4 6.5  --   --   HGB 10.8*   < > 10.3* 10.2* 11.0* 10.1* 8.8*  HCT 32.4*   < > 32.6* 31.7* 34.6* 31.6* 26.8*  MCV 60.9*   < > 61.0* 60.5* 60.7* 60.5* 59.8*  PLT 216   < > 209 212 217 172 149*   < > = values in this interval not displayed.   Basic Metabolic Panel: Recent Labs  Lab 03/26/24 1330 03/27/24 0209 03/28/24 0510 03/29/24 0522 03/30/24 0534  NA 138 137 138 137 139  K 3.9 3.6 4.0 4.1 3.7  CL 102 100 101 102 104  CO2 25 26 26 27 26   GLUCOSE 109* 164* 114* 122* 123*  BUN 16 16 16 18 15   CREATININE 1.25* 1.21 1.48* 1.32* 1.24  CALCIUM 9.5 9.4 9.0 9.0 8.9  MG 1.9  --   --   --   --    GFR: Estimated Creatinine Clearance: 83.6 mL/min (by C-G formula based on SCr of 1.24 mg/dL). Liver Function Tests: Recent Labs  Lab 03/26/24 1330 03/27/24 0209 03/28/24 0510 03/29/24 0522 03/30/24 0534  AST 178* 164* 110* 72* 54*  ALT 497* 495* 352* 238* 198*  ALKPHOS 305* 316* 270* 217* 210*  BILITOT 11.1* 12.3* 16.5* 14.1* 11.7*  PROT 6.9 7.0 6.7 6.0* 6.2*  ALBUMIN 3.6 3.9 3.6 3.1* 3.2*   Recent Labs  Lab 03/27/24 0209 03/29/24 0522  LIPASE 288* 75*   No results for input(s): AMMONIA in the last 168 hours. Coagulation Profile: Recent Labs  Lab 03/24/24 1648  INR 1.0   Cardiac Enzymes: No results for input(s): CKTOTAL, CKMB, CKMBINDEX, TROPONINI in the last 168 hours. BNP (last 3 results) No results for input(s): PROBNP in the last 8760 hours. HbA1C: No results for input(s): HGBA1C in the last 72 hours. CBG: Recent Labs  Lab 03/25/24 2140  GLUCAP 268*   Lipid Profile: No results for input(s): CHOL, HDL, LDLCALC, TRIG, CHOLHDL, LDLDIRECT in the last 72 hours. Thyroid  Function Tests: No results for input(s): TSH,  T4TOTAL, FREET4, T3FREE, THYROIDAB in the last 72 hours. Anemia Panel: No results for input(s): VITAMINB12, FOLATE, FERRITIN, TIBC, IRON, RETICCTPCT in the last 72 hours. Sepsis Labs: No results for input(s): PROCALCITON, LATICACIDVEN in the last 168 hours.  Recent Results (from the past 240 hours)  Culture, blood (Routine X 2) w Reflex to ID Panel     Status: None (Preliminary result)   Collection Time: 03/27/24 11:30 AM   Specimen: BLOOD LEFT FOREARM  Result Value Ref Range Status   Specimen Description   Final    BLOOD LEFT FOREARM Performed at [facility], [address], [place], [place] [address]    Special Requests   Final    BOTTLES DRAWN AEROBIC AND ANAEROBIC Blood Culture results may not be optimal due to an inadequate volume of blood received in culture bottles Performed at [facility], [address], [place], [place] [address]    Culture  Setup Time   Final    GRAM NEGATIVE RODS IN BOTH AEROBIC AND ANAEROBIC BOTTLES CRITICAL RESULT CALLED TO, READ BACK BY AND VERIFIED WITH: MICHELLE LILLYSTONE PHARM D 03/29/2024 0441 BY DD    Culture   Final    GRAM NEGATIVE RODS IDENTIFICATION AND SUSCEPTIBILITIES TO FOLLOW Performed at [facility], [address] [place], [place] [address]    Report Status PENDING  Incomplete  Blood Culture ID Panel (Reflexed)     Status: Abnormal   Collection Time: 03/27/24 11:30 AM  Result Value Ref Range Status   Enterococcus faecalis NOT DETECTED NOT DETECTED Final   Enterococcus Faecium NOT DETECTED NOT DETECTED Final   Listeria monocytogenes NOT DETECTED NOT DETECTED Final   Staphylococcus species NOT DETECTED NOT DETECTED Final   Staphylococcus aureus (BCID) NOT DETECTED NOT DETECTED Final   Staphylococcus epidermidis NOT DETECTED NOT DETECTED Final   Staphylococcus lugdunensis NOT DETECTED NOT DETECTED Final   Streptococcus species NOT DETECTED NOT DETECTED Final    Streptococcus agalactiae NOT DETECTED NOT DETECTED Final   Streptococcus pneumoniae NOT DETECTED NOT DETECTED Final   Streptococcus pyogenes NOT DETECTED NOT DETECTED Final   A.calcoaceticus-baumannii NOT DETECTED NOT DETECTED Final   Bacteroides fragilis NOT DETECTED NOT DETECTED Final   Enterobacterales DETECTED (A) NOT DETECTED Final    Comment: Enterobacterales represent a large order of gram negative bacteria, not a single organism. Refer to culture for further identification. CRITICAL RESULT CALLED TO, READ BACK BY AND VERIFIED WITH: MICHELLE LILLYSTONE PHARM D 03/29/2024 0441 BY DD    Enterobacter cloacae complex NOT DETECTED NOT DETECTED Final   Escherichia coli NOT DETECTED NOT DETECTED Final   Klebsiella aerogenes NOT DETECTED NOT DETECTED Final   Klebsiella oxytoca NOT DETECTED NOT DETECTED Final   Klebsiella pneumoniae NOT DETECTED NOT DETECTED Final  Proteus species NOT DETECTED NOT DETECTED Final   Salmonella species NOT DETECTED NOT DETECTED Final   Serratia marcescens NOT DETECTED NOT DETECTED Final   Haemophilus influenzae NOT DETECTED NOT DETECTED Final   Neisseria meningitidis NOT DETECTED NOT DETECTED Final   Pseudomonas aeruginosa NOT DETECTED NOT DETECTED Final   Stenotrophomonas maltophilia NOT DETECTED NOT DETECTED Final   Candida albicans NOT DETECTED NOT DETECTED Final   Candida auris NOT DETECTED NOT DETECTED Final   Candida glabrata NOT DETECTED NOT DETECTED Final   Candida krusei NOT DETECTED NOT DETECTED Final   Candida parapsilosis NOT DETECTED NOT DETECTED Final   Candida tropicalis NOT DETECTED NOT DETECTED Final   Cryptococcus neoformans/gattii NOT DETECTED NOT DETECTED Final   CTX-M ESBL NOT DETECTED NOT DETECTED Final   Carbapenem resistance IMP NOT DETECTED NOT DETECTED Final   Carbapenem resistance KPC NOT DETECTED NOT DETECTED Final   Carbapenem resistance NDM NOT DETECTED NOT DETECTED Final   Carbapenem resist OXA 48 LIKE NOT DETECTED NOT  DETECTED Final   Carbapenem resistance VIM NOT DETECTED NOT DETECTED Final    Comment: Performed at [facility], [address], [place], [place] [address]  Culture, blood (Routine X 2) w Reflex to ID Panel     Status: None (Preliminary result)   Collection Time: 03/27/24 11:35 AM   Specimen: BLOOD LEFT FOREARM  Result Value Ref Range Status   Specimen Description   Final    BLOOD LEFT FOREARM Performed at [facility], [address] [place], [place] [address]    Special Requests   Final    BOTTLES DRAWN AEROBIC AND ANAEROBIC Blood Culture results may not be optimal due to an inadequate volume of blood received in culture bottles Performed at [facility], [address] [place], [place] [address]    Culture   Final    NO GROWTH 3 DAYS Performed at [facility], [address], [place], [place] [address]    Report Status PENDING  Incomplete         Radiology Studies: No results found.       Scheduled Meds:  amLODipine   10 mg Oral Daily   enoxaparin (LOVENOX) injection  40 mg Subcutaneous Q24H   indomethacin  100 mg Rectal Once   levothyroxine   125 mcg Oral QAC breakfast   pantoprazole   40 mg Oral Daily   Continuous Infusions:  piperacillin-tazobactam (ZOSYN)  IV 3.375 g (03/30/24 0923)     LOS: 3 days    Time spent: 48 minutes spent on 03/30/2024 caring for this patient face-to-face including chart review, ordering labs/tests, documenting, discussion with nursing staff, consultants, updating family and interview/physical exam    Camellia PARAS [place], DO [facility] Available via Epic secure chat 7am-7pm After these hours, please refer to coverage provider listed on [email or web address] 03/30/2024, 10:33 AM

## 2024-03-30 NOTE — Progress Notes (Signed)
 AVS given to patient and explained at the bedside. Medications and follow up appointments have been explained with pt verbalizing understanding.

## 2024-03-30 NOTE — Progress Notes (Signed)
 HISTORY OF PRESENT ILLNESS:  Douglas Harper is a 53 y.o. male with metastatic pancreatic cancer who underwent ERCP with stent exchange 2 days ago.  He had evidence of cholangitis.  Doing well.  Has been on antibiotics.  No issues overnight.  Tolerating diet.  No fevers.  No pain.  Wants to go home.  Liver test trending in the correct direction.  REVIEW OF SYSTEMS:  All non-GI ROS negative. Past Medical History:  Diagnosis Date   COVID-19    03/17/20   FH: heart disease    GERD (gastroesophageal reflux disease)    Glaucoma    Hypertension    Hyperthyroidism    s/p RAI 2009    Hypothyroidism    Verruca vulgaris     Past Surgical History:  Procedure Laterality Date   ANTERIOR CRUCIATE LIGAMENT REPAIR Right 2000   ERCP N/A 03/25/2024   Procedure: ERCP, WITH INTERVENTION IF INDICATED;  Surgeon: Abran Norleen SAILOR, MD;  Location: [facility];  Service: Gastroenterology;  Laterality: N/A;   HAND SURGERY Right 2001   with hardware implanted   IR IMAGING GUIDED PORT INSERTION  03/17/2024   NO PAST SURGERIES      Social History Douglas Harper  reports that he has never smoked. He has never used smokeless tobacco. He reports current alcohol use. He reports that he does not use drugs.  family history includes Diabetes in his mother; Heart disease in his mother; Hypertension in his mother.  No Known Allergies     PHYSICAL EXAMINATION: Vital signs: BP (!) 138/90 (BP Location: Right Arm)   Pulse 77   Temp 98.3 F (36.8 C) (Oral)   Resp 20   Ht 6' 4 (1.93 m)   Wt 91.8 kg   SpO2 97%   BMI 24.63 kg/m  General: Well-developed, well-nourished, no acute distress HEENT: Sclerae are icteric, conjunctiva pink. Oral mucosa intact Lungs: Clear Heart: Regular Abdomen: soft, nontender, nondistended, no obvious ascites, no peritoneal signs, normal bowel sounds. No organomegaly. Extremities: No edema Psychiatric: alert and oriented x3. Cooperative     ASSESSMENT:  1.  Metastatic pancreatic  cancer with malignant obstructive jaundice.  Status post ERCP with stent placement 5 days ago.  Status post ERCP with stent and replacement 2 days ago. 2.  Evidence of cholangitis 3.  Mild post PREVIOUS (5 days ago) procedure pancreatitis.  Better  PLAN:  1.  Okay for discharge 2.  PLEASE DISCHARGE HOME CIPROFLOXACIN 500 MG P.O. TWICE DAILY X 7 DAYS. 3.  FOLLOW-UP WITH ONCOLOGY.  THEY WILL TREND HIS BLOOD WORK  Will sign off  Douglas Christiano N. Abran Raddle., M.D. [facility] Division of Gastroenterology

## 2024-03-30 NOTE — Discharge Summary (Signed)
 Physician Discharge Summary  Douglas Harper FMW:[id number] DOB: [birth year] DOA: 03/27/2024  PCP: [facility]  Admit date: 03/27/2024 Discharge date: 03/30/2024  Admitted From: Home Disposition: Home  Recommendations for Outpatient Follow-up:  Follow up with PCP in 1-2 weeks Follow-up with medical oncology, Dr. Cloretta Continue antibiotics with ciprofloxacin 500 mg p.o. twice daily x 7 days per GI recommendations for cholangitis, Citrobacter bacteremia Recommend repeat CMP at next outpatient visit.  Home Health: No Equipment/Devices: None  Discharge Condition: Stable CODE STATUS: Full code Diet recommendation: Regular diet  History of present illness:  Douglas Harper is a 53 y.o. male with past medical history significant for stage IV pancreatic cancer status post recent ERCP with biliary sphincterotomy and metal stent placement 10/7, HTN, hypothyroidism who presented to [facility] ED on 03/27/2024 with recurrent severe epigastric abdominal pain with radiation towards his back.  Denied nausea, vomiting, no fever/chills, no chest pain.   In the ED, temperature 102.9 F, HR 115, RR 20, BP 162/90, SpO2 98% on 4 L nasal cannula.  WBC 7.9, hemoglobin 11.0, platelet count 217.  Sodium 137, potassium 3.6, chloride 100, CO2 26, glucose 164, BUN 16, creatinine 1.21.  Lipase 288.  AST 164, ALT 495, total bilirubin 12.3.  Urinalysis with moderate hemoglobin, moderate bilirubin, negative leukocytes/nitrite, no bacteria, 0-5 WBCs.  Blood cultures x 2 obtained.  Chest x-ray with no active Karve pulm disease process.  CT abdomen/pelvis with interval placement palliative metallic internal biliary stent transversing the ampulla and extending into the cystic duct, does not clearly communicate with the common duct proximal to the obstructing mass, intra/extrahepatic biliary ductal dilation, stable by lobar hepatic metastatic disease, 4.7 cm hypoenhancing mass at the end of the pancreas, mild  peripancreatic inflammatory stranding consistent with mild pancreatitis without loculated fluid collection, no peripancreatic necrosis noted, stable encasement and marked narrowing main portal vein, mild hepatic steatosis, minimal left nonobstructing nephrolithiasis.  GI consulted.  TRH consulted for admission for further evaluation and management of obstructive jaundice likely secondary to stent migration, acute pancreatitis.  Hospital course:  Obstructive jaundice Hx stage IV pancreatic adenocarcinoma Cholangitis secondary to biliary obstruction Patient presenting to the ED with recurrent epigastric abdominal pain radiating toward his back.  Newly diagnosed 03/09/2024 and follows with oncology; Dr. Cloretta outpatient.  Recently had ERCP with biliary sphincterotomy and stent placement by GI, Dr. Abran on 03/25/2024.  Repeat imaging on admission notable for stent migration and now are not clearly communicating with the common duct proximal to the obstructing mass.  Also febrile 102.7 at time of admission without leukocytosis.  Noted elevated LFTs, total bilirubin.  GI was consulted and patient underwent ERCP with stent replacement on 03/28/2024 by Dr. Abran.  LFTs were monitored and were trending down at time of discharge.  Continue antibiotics with ciprofloxacin to complete course for cholangitis.  Recommend CMP at next outpatient visit.  GNR bacteremia Blood cultures 2 out of 4 from left arm positive for Citrobacter koseri.  Initially was placed on IV Zosyn and will transition to ciprofloxacin on discharge to complete antibiotic course.   Acute pancreatitis Patient complaining of epigastric pain complicated by obstructive jaundice as above.  Lipase elevated to 88.  CT imaging notable for mild peripancreatic stranding consistent with pancreatitis.  Patient's diet was slowly advanced with toleration.   HTN Amlodipine  10 mg p.o. daily   Hypothyroidism Levothyroxine  125 mcg p.o. daily   Anemia,  microcytic Hemoglobin 10.1; stable  Discharge Diagnoses:  Principal Problem:   Post-ERCP  acute pancreatitis Active Problems:   Obstructive jaundice due to cancer [facility])   Upper abdominal pain   Acute cholangitis [facility])    Discharge Instructions  Discharge Instructions     Call MD for:  difficulty breathing, headache or visual disturbances   Complete by: As directed    Call MD for:  extreme fatigue   Complete by: As directed    Call MD for:  persistant dizziness or light-headedness   Complete by: As directed    Call MD for:  persistant nausea and vomiting   Complete by: As directed    Call MD for:  severe uncontrolled pain   Complete by: As directed    Call MD for:  temperature >100.4   Complete by: As directed    Diet - low sodium heart healthy   Complete by: As directed    Increase activity slowly   Complete by: As directed       Allergies as of 03/30/2024   No Known Allergies      Medication List     TAKE these medications    amLODipine  10 MG tablet Commonly known as: NORVASC  Take 1 tablet (10 mg total) by mouth daily.   ciprofloxacin 500 MG tablet Commonly known as: Cipro Take 1 tablet (500 mg total) by mouth 2 (two) times daily for 7 days.   HYDROmorphone  2 MG tablet Commonly known as: Dilaudid  Take 0.5 tablets (1 mg total) by mouth every 6 (six) hours as needed for severe pain (pain score 7-10).   levothyroxine  125 MCG tablet Commonly known as: SYNTHROID  TAKE 1 TABLET BY MOUTH 30 MINUTES BEFORE BREAKFAST (STOP DOSE) What changed: See the new instructions.   lidocaine -prilocaine cream Commonly known as: EMLA Apply 1 Application topically as needed. Apply 1 Application topically as needed (Apply 1 hours prior to appt)   omeprazole 40 MG capsule Commonly known as: PRILOSEC Take 40 mg by mouth daily.   ondansetron  4 MG tablet Commonly known as: Zofran  Take 1 tablet (4 mg total) by mouth every 8 (eight) hours as needed for nausea or  vomiting.   prochlorperazine 10 MG tablet Commonly known as: COMPAZINE Take 1 tablet (10 mg total) by mouth every 6 (six) hours as needed for nausea or vomiting.        Follow-up Information     [facility]. Schedule an appointment as soon as possible for a visit in 1 week(s).   Specialty: Family Medicine Contact information: [address] [place] [address] [phone]         Cloretta Arley NOVAK, MD. Schedule an appointment as soon as possible for a visit.   Specialty: Oncology Contact information: [address] [place] [address] [phone]                No Known Allergies  Consultations: [facility]   Procedures/Studies: DG CHEST PORT 1 VIEW Result Date: 03/27/2024 CLINICAL DATA:  Fever, chest pain. EXAM: PORTABLE CHEST 1 VIEW COMPARISON:  March 09, 2024. FINDINGS: Stable cardiomediastinal silhouette. Interval placement of left internal jugular Port-A-Cath with distal tip in expected position of SVC. Both lungs are clear. The visualized skeletal structures are unremarkable. IMPRESSION: No active disease. Electronically Signed   By: Lynwood Landy Raddle M.D.   On: 03/27/2024 11:22   CT ABDOMEN PELVIS W CONTRAST Result Date: 03/27/2024 CLINICAL DATA:  Epigastric abdominal pain following ERCP EXAM: CT ABDOMEN AND PELVIS WITH CONTRAST TECHNIQUE: Multidetector CT imaging of the abdomen and pelvis  was performed using the standard protocol following bolus administration of intravenous contrast. RADIATION DOSE REDUCTION: This exam was performed according to the departmental dose-optimization program which includes automated exposure control, adjustment of the mA and/or kV according to patient size and/or use of iterative reconstruction technique. CONTRAST:  OMNIPAQUE  IOHEXOL  300 MG/ML  SOLN COMPARISON:  03/24/2024 FINDINGS: Lower chest: No acute abnormality.  Mild bibasilar atelectasis Hepatobiliary: Mild hepatic  steatosis. Bilobar hepatic metastatic disease is again identified and appears stable. Intra and extrahepatic biliary ductal dilation appears stable there is no contrast opacification of the gallbladder lumen. Since the prior examination, palliative metallic internal biliary stent has been placed traversing the ampulla and extending into the cystic duct this device does not clearly communicate with the common duct proximal to the obstructing mass, particularly when compared to prior examination where both ductal structures are better delineated. Pancreas: Hypoenhancing mass within the head of the pancreas is again identified measuring roughly 3.5 x 4.7 cm on image 31/2. There is normal enhancement of the body and tail the pancreas. Previously noted pancreatic ductal dilation has resolved. There is now developed mild peripancreatic inflammatory stranding diffusely in keeping with changes of mild interstitial/edematous pancreatitis. No loculated peripancreatic fluid collections. No pancreatic peripancreatic necrosis identified. Spleen: Unremarkable Adrenals/Urinary Tract: The adrenal glands are unremarkable. 10 mm nonobstructing calculus noted within the upper pole of the left kidney. Simple cortical cyst noted within the upper pole the left kidney for which no follow-up imaging is recommended. The kidneys are normal in size and position. No hydronephrosis. No ureteral calculi. No perinephric inflammatory stranding or fluid collections are seen. The bladder is unremarkable. Stomach/Bowel: Stomach is within normal limits. Appendix appears normal. No evidence of bowel wall thickening, distention, or inflammatory changes. Vascular/Lymphatic: The main portal vein is encased by the mass and markedly narrowed at the portomesenteric confluence, similar to prior examination, best seen on image 30/2 and 91/9. This vessel remains patent, however. The abdominal vasculature is otherwise unremarkable. Reproductive: Prostate is  unremarkable. Other: Trace ascites Musculoskeletal: No acute or significant osseous findings. IMPRESSION: 1. Interval placement of a palliative metallic internal biliary stent traversing the ampulla and extending into the cystic duct. This device does not clearly communicate with the common duct proximal to the obstructing mass, particularly when compared to prior examination where both ductal structures are better delineated. 2. Stable intra and extrahepatic biliary ductal dilation. 3. Stable bilobar hepatic metastatic disease. 4. Stable 4.7 cm hypoenhancing mass within the head of the pancreas. 5. Interval development of mild peripancreatic inflammatory stranding in keeping with changes of mild interstitial/edematous pancreatitis. No loculated peripancreatic fluid collections. No pancreatic or peripancreatic necrosis identified. 6. Stable encasement and marked narrowing of the main portal vein at the portomesenteric confluence. This vessel remains patent. 7. Mild hepatic steatosis. 8. Minimal left nonobstructing nephrolithiasis.  A Electronically Signed   By: Dorethia Molt M.D.   On: 03/27/2024 04:34   US  BIOPSY (LIVER) Result Date: 03/26/2024 INDICATION: [id number] Liver mass [id number].  History of pancreatic cancer. EXAM: ULTRASOUND GUIDED LIVER MASS BIOPSY COMPARISON:  CT AP, 03/24/2024.  US  Abdomen, 03/09/2024. MEDICATIONS: None ANESTHESIA/SEDATION: Moderate (conscious) sedation was employed during this procedure. A total of Versed  2 mg and Fentanyl  100 mcg was administered intravenously. Moderate Sedation Time: 10 minutes. The patient's level of consciousness and vital signs were monitored continuously by radiology nursing throughout the procedure under my direct supervision. COMPLICATIONS: None immediate. PROCEDURE: Informed written consent was obtained from the patient and/or patient's representative after a discussion  of the risks, benefits and alternatives to treatment. The patient understands and consents  the procedure. A timeout was performed prior to the initiation of the procedure. Ultrasound scanning was performed of the right upper abdominal quadrant demonstrates multifocal hepatic masses The RIGHT hepatic lobe was selected for biopsy and the procedure was planned. The right upper abdominal quadrant was prepped and draped in the usual sterile fashion. The overlying soft tissues were anesthetized with 1% lidocaine . A 17 gauge co-axial needle was advanced into a peripheral aspect of the lesion. This was followed by 4 core biopsies with an 18 gauge core device under direct ultrasound guidance. The needle was removed, then superficial hemostasis was obtained with manual compression. Post procedural scanning was negative for definitive area of hemorrhage or additional complication. A dressing was placed. The patient tolerated the procedure well without immediate post procedural complication. IMPRESSION: Successful ultrasound-guided core needle biopsy of a liver mass. Thom Hall, MD [facility] Electronically Signed   By: Thom Hall M.D.   On: 03/26/2024 14:05   DG ERCP Result Date: 03/26/2024 CLINICAL DATA:  [id number] Surgery, elective [id number] EXAM: ERCP COMPARISON:  CT AP, 03/26/2024 and 03/09/2024. FLUOROSCOPY: Exposure Index (as provided by the fluoroscopic device): 215 mGy Kerma FINDINGS: Limited oblique planar images of the RIGHT upper quadrant obtained C-arm. Images demonstrating flexible endoscopy, biliary duct cannulation, retrograde cholangiogram and metallic biliary stent placement. No biliary ductal dilation. No evidence of biliary filling defect is demonstrated. IMPRESSION: Fluoroscopic imaging for ERCP and metallic biliary stent placement. For complete description of intra procedural findings, please see performing service dictation. Electronically Signed   By: Thom Hall M.D.   On: 03/26/2024 09:34   DG C-Arm 1-60 Min-No Report Result  Date: 03/25/2024 Fluoroscopy was utilized by the requesting physician.  No radiographic interpretation.   DG C-Arm 1-60 Min-No Report Result Date: 03/25/2024 Fluoroscopy was utilized by the requesting physician.  No radiographic interpretation.   CT ABDOMEN PELVIS W CONTRAST Result Date: 03/24/2024 CLINICAL DATA:  Elevated bilirubin. On chemotherapy for pancreatic carcinoma. * Tracking Code: BO * EXAM: CT ABDOMEN AND PELVIS WITH CONTRAST TECHNIQUE: Multidetector CT imaging of the abdomen and pelvis was performed using the standard protocol following bolus administration of intravenous contrast. RADIATION DOSE REDUCTION: This exam was performed according to the departmental dose-optimization program which includes automated exposure control, adjustment of the mA and/or kV according to patient size and/or use of iterative reconstruction technique. CONTRAST:  OMNIPAQUE  IOHEXOL  300 MG/ML  SOLN COMPARISON:  03/09/2024 FINDINGS: Lower chest: Clear lung bases. Normal heart size without pericardial or pleural effusion. Hepatobiliary: Bilobar hepatic metastasis. Index segment 3 mass measures 3.8 x 3.3 cm in 24/2 versus 3.2 x 3.4 cm when measured in a similar fashion on the prior. Index subcapsular right hepatic lobe lesion measures 2.0 cm on image 31/2 versus 2.3 cm when remeasured in a similar fashion on the prior. Index subcapsular right hepatic lobe 1.6 cm lesion on 20/2 measured 1.4 cm on the prior. Intrahepatic biliary duct dilatation is mild and minimally increased. Common duct measures 12 mm on coronal image 58 versus 9 mm on the prior exam when remeasured in similar fashion. Common duct is obstructed by the pancreatic head mass detailed below. Subtle stone or stones in the gallbladder fundus. Pancreas: Pancreatic head/uncinate process mass measures 2.7 x 3.0 cm on 31/2 and is felt to be similar to on the prior (when remeasured). The upstream pancreatic duct dilatation and atrophy are moderate and similar.  No evidence of superimposed pancreatitis. Spleen: Normal in size, without focal abnormality. Adrenals/Urinary Tract: Normal adrenal glands. Left renal collecting system calculi up to 1.0 cm Low-density left renal lesions up to 1.5 cm are likely cysts and do not warrant imaging follow-up. No hydronephrosis.  Normal urinary bladder. Stomach/Bowel: Normal stomach, without wall thickening. Normal colon and terminal ileum. Normal small bowel. Vascular/Lymphatic: Aortic atherosclerosis. Splenoportal confluence and SMV narrowing by tumor. No arterial encasement. No abdominopelvic adenopathy. Reproductive: Normal prostate. Other: No significant free fluid.  No free intraperitoneal air. Musculoskeletal: Lumbar spondylosis. IMPRESSION: 1. The pancreatic head/uncinate process primary and liver metastasis are relatively similar. 2. Increase in mild to moderate biliary duct dilatation. 3. Incidental findings, including: Left nephrolithiasis. Cholelithiasis. Case discussed with Dr. Cloretta  at 1:10 p.m. Electronically Signed   By: Rockey Kilts M.D.   On: 03/24/2024 13:22   CT Abdomen Pelvis W Contrast Result Date: 03/18/2024 CLINICAL DATA:  Liver mass on right upper quadrant ultrasound, chest pain. * Tracking Code: BO * EXAM: CT CHEST WITH CONTRAST CT ABDOMEN WITHOUT AND WITH CONTRAST TECHNIQUE: Multidetector CT imaging of the abdomen was performed without intravenous contrast. Multidetector CT imaging of the chest and abdomen was then performed during bolus administration of intravenous contrast. RADIATION DOSE REDUCTION: This exam was performed according to the departmental dose-optimization program which includes automated exposure control, adjustment of the mA and/or kV according to patient size and/or use of iterative reconstruction technique. CONTRAST:  75mL OMNIPAQUE  IOHEXOL  350 MG/ML SOLN COMPARISON:  Ultrasound abdomen from earlier the same day. FINDINGS: CT CHEST FINDINGS Cardiovascular: Normal cardiac size. No  pericardial effusion. No aortic aneurysm. There are coronary artery calcifications, in keeping with coronary artery disease. Mediastinum/Nodes: Visualized thyroid  gland appears grossly unremarkable. No solid / cystic mediastinal masses. The esophagus is nondistended precluding optimal assessment. No axillary, mediastinal or hilar lymphadenopathy by size criteria. Lungs/Pleura: The central tracheo-bronchial tree is patent. There are dependent changes in bilateral lungs. No mass or consolidation. No pleural effusion or pneumothorax. No suspicious lung nodules. Musculoskeletal: The visualized soft tissues of the chest wall are grossly unremarkable. No suspicious osseous lesions. CT ABDOMEN PELVIS FINDINGS Hepatobiliary: The liver is normal in size. There is liver surface irregularity/nodularity, suggesting cirrhosis cirrhotic appearance due to underlying liver lesions described as follows. There are multiple (more than 15), ill-defined hypoattenuating masses throughout the liver with largest in the left hepatic lobe, segment 4 B/3 junction region measuring up to 3.0 x 3.2 cm. No intrahepatic or extrahepatic bile duct dilation. No calcified gallstones. Normal gallbladder wall thickness. No pericholecystic inflammatory changes. Pancreas: There is an ill-defined, hypoenhancing, approximately 1.6 x 1.9 cm lesion in the pancreatic head, which is highly concerning for pancreatic malignancy. There is resultant moderate upstream main pancreatic duct dilation, measuring up to 8 mm in the neck region. No peripancreatic fat stranding. There is resultant up to moderate narrowing of the portal vein at the level of confluence of SMV/splenic veins (series 7, image 66). Rest of the main portal vein and its tributaries are otherwise normal in caliber and patent. Unremarkable hepatic veins. There is subtle fat stranding surrounding the pancreatic head/uncinate process and neck region, which is nonspecific but can be seen with acute  interstitial pancreatitis. Correlate clinically and with serum lipase levels. Spleen: Within normal limits. No focal lesion. Adrenals/Urinary Tract: There are bilateral adrenal adenomas. On the left it measures up to 1.0 x 1.6 cm and on the right it measures up to 0.9 x 1.2 cm. No suspicious renal mass.  There is a 1.3 x 1.6 cm simple cyst arising from the left kidney upper pole. There is a 6 x 10 mm nonobstructing calculus in the left kidney upper pole and an additional 2 mm nonobstructing calculus in the left kidney lower pole. No other nephroureterolithiasis on either side. No obstructive uropathy on either side. Urinary bladder is under distended, precluding optimal assessment. However, no large mass or stones identified. No perivesical fat stranding. Stomach/Bowel: No disproportionate dilation of the small or large bowel loops. No evidence of abnormal bowel wall thickening. There is subtle fat stranding surrounding the proximal duodenum, which is nonspecific but typically seen as sequela of pancreatitis or duodenitis. Correlate clinically. The appendix is unremarkable. Vascular/Lymphatic: No ascites or pneumoperitoneum. No abdominal or pelvic lymphadenopathy, by size criteria. No aneurysmal dilation of the major abdominal arteries. Reproductive: Normal size prostate. Symmetric seminal vesicles. Other: There is a tiny fat containing umbilical hernia. There are bilateral small fat containing inguinal hernias. Musculoskeletal: No suspicious osseous lesions. There are mild multilevel degenerative changes in the visualized spine. IMPRESSION: 1. There is an ill-defined, hypoenhancing, approximately 1.6 x 1.9 cm lesion in the pancreatic head, which is highly concerning for pancreatic malignancy. There is resultant moderate upstream main pancreatic duct dilation, measuring up to 8 mm in the neck region. There is also resultant up to moderate narrowing of the portal vein at the level of confluence of SMV/splenic veins.  2. There are multiple (more than 15), ill-defined hypoattenuating masses throughout the liver with largest in the left hepatic lobe, segment 4B/3 junction region measuring up to 3.0 x 3.2 cm. These are compatible with metastases. 3. There is subtle fat stranding surrounding the pancreatic head/uncinate process and neck region, which is nonspecific but can be seen with acute interstitial pancreatitis. There is subtle fat stranding surrounding the proximal duodenum, which is typically seen as sequela of pancreatitis or duodenitis. 4. No metastatic disease identified within the chest. 5. Multiple other nonacute observations, as described above. Aortic Atherosclerosis (ICD10-I70.0). Electronically Signed   By: Ree Molt M.D.   On: 03/18/2024 11:35   IR IMAGING GUIDED PORT INSERTION Result Date: 03/17/2024 CLINICAL DATA:  Pancreatic adenocarcinoma. Chest port placement for therapy. EXAM: Chest port catheter placement TECHNIQUE: Procedure performed using fluoroscopy and ultrasound CONTRAST:  None RADIOPHARMACEUTICALS:  None FLUOROSCOPY: Three mGy COMPARISON:  None FINDINGS: The patient was placed in supine position on the IR gantry and the left upper chest and neck were prepped and draped in the usual sterile fashion. The nurse administered intravenous fentanyl  and Versed  under my supervision and the nurse had no other injuries other than monitoring the patient and administering medications. I was present for the entire duration of procedure. 2 mg intravenous Versed , 100 mcg intravenous fentanyl , 25 mg Benadryl  administered for a total sedation time of 24 minutes Ultrasound guidance was used to investigate the left internal jugular vein which was anechoic and compressible indicating patency. The needle was then advanced from a skin incision through the soft tissue into the left internal jugular vein under ultrasound guidance. A final image was obtained and stored in the patient's permanent medical record. Access  was then exchanged over a guidewire which was advanced under fluoroscopic guidance. The needle was removed and replaced with a micropuncture sheath. Approximately 2 inches below the clavicle the port pocket was created with a subsequent incision. The catheter was then tunneled from the port pocket to the venotomy site overlying the left internal jugular vein. Access was then exchanged over an 035  guidewire for peel-away sheath which was advanced over the guidewire under fluoroscopic guidance. The catheter was then advanced through the peel-away sheath to the cavoatrial junction. Sheath was removed. The catheter was then cut at the port pocket and connected to chest port. The chest port was tested for function and finally function well. The chest port was then flushed with heparin and a port pocket was closed with 4-0 suture. Final image was obtained demonstrating satisfactory position of chest port. The final count of all materials was satisfactory. IMPRESSION: 1. Satisfactory placement of left internal jugular vein single-lumen chest port. 2.  Okay to use and power inject chest port. Electronically Signed   By: Cordella Banner   On: 03/17/2024 15:51   US  BIOPSY (LIVER) Result Date: 03/11/2024 INDICATION: 53 year old male with history of multifocal liver masses suspicious for metastasis. EXAM: ULTRASOUND GUIDED LIVER LESION BIOPSY COMPARISON:  None Available. MEDICATIONS: None ANESTHESIA/SEDATION: Fentanyl  100 mcg IV; Versed  2 mg IV Total Moderate Sedation time:  17 minutes. The patient's level of consciousness and vital signs were monitored continuously by radiology nursing throughout the procedure under my direct supervision. COMPLICATIONS: None immediate. PROCEDURE: Informed written consent was obtained from the patient after a discussion of the risks, benefits and alternatives to treatment. The patient understands and consents the procedure. A timeout was performed prior to the initiation of the procedure.  Ultrasound scanning was performed of the right upper abdominal quadrant demonstrates ill-defined hypoechoic mass in in the left lobe of the liver. The left lobe liver mass was selected for biopsy and the procedure was planned. The right upper abdominal quadrant was prepped and draped in the usual sterile fashion. The overlying soft tissues were anesthetized with 1% lidocaine  with epinephrine . A 17 gauge, 6.8 cm co-axial needle was advanced into a peripheral aspect of the lesion. This was followed by 3 core biopsies with an 18 gauge core device under direct ultrasound guidance. The coaxial needle tract was embolized with a small amount of Gel-Foam slurry and superficial hemostasis was obtained with manual compression. Post procedural scanning was negative for definitive area of hemorrhage or additional complication. A dressing was placed. The patient tolerated the procedure well without immediate post procedural complication. IMPRESSION: Technically successful ultrasound guided core needle biopsy of left lobe liver mass. Ester Sides, MD [facility] Radiology Electronically Signed   By: Ester Sides M.D.   On: 03/11/2024 16:20   CT ABDOMEN PELVIS W WO CONTRAST Result Date: 03/09/2024 CLINICAL DATA:  Liver mass on right upper quadrant ultrasound, chest pain. * Tracking Code: BO * EXAM: CT CHEST WITH CONTRAST CT ABDOMEN WITHOUT AND WITH CONTRAST TECHNIQUE: Multidetector CT imaging of the abdomen was performed without intravenous contrast. Multidetector CT imaging of the chest and abdomen was then performed during bolus administration of intravenous contrast. RADIATION DOSE REDUCTION: This exam was performed according to the departmental dose-optimization program which includes automated exposure control, adjustment of the mA and/or kV according to patient size and/or use of iterative reconstruction technique. CONTRAST:  75mL OMNIPAQUE  IOHEXOL  350 MG/ML SOLN  COMPARISON:  Ultrasound abdomen from earlier the same day. FINDINGS: CT CHEST FINDINGS Cardiovascular: Normal cardiac size. No pericardial effusion. No aortic aneurysm. There are coronary artery calcifications, in keeping with coronary artery disease. Mediastinum/Nodes: Visualized thyroid  gland appears grossly unremarkable. No solid / cystic mediastinal masses. The esophagus is nondistended precluding optimal assessment. No axillary, mediastinal or hilar lymphadenopathy by size criteria. Lungs/Pleura: The central tracheo-bronchial tree is patent. There are dependent changes  in bilateral lungs. No mass or consolidation. No pleural effusion or pneumothorax. No suspicious lung nodules. Musculoskeletal: The visualized soft tissues of the chest wall are grossly unremarkable. No suspicious osseous lesions. CT ABDOMEN PELVIS FINDINGS Hepatobiliary: The liver is normal in size. There is liver surface irregularity/nodularity, suggesting cirrhosis cirrhotic appearance due to underlying liver lesions described as follows. There are multiple (more than 15), ill-defined hypoattenuating masses throughout the liver with largest in the left hepatic lobe, segment 4 B/3 junction region measuring up to 3.0 x 3.2 cm. No intrahepatic or extrahepatic bile duct dilation. No calcified gallstones. Normal gallbladder wall thickness. No pericholecystic inflammatory changes. Pancreas: There is an ill-defined, hypoenhancing, approximately 1.6 x 1.9 cm lesion in the pancreatic head, which is highly concerning for pancreatic malignancy. There is resultant moderate upstream main pancreatic duct dilation, measuring up to 8 mm in the neck region. No peripancreatic fat stranding. There is resultant up to moderate narrowing of the portal vein at the level of confluence of SMV/splenic veins (series 7, image 66). Rest of the main portal vein and its tributaries are otherwise normal in caliber and patent. Unremarkable hepatic veins. There is subtle fat  stranding surrounding the pancreatic head/uncinate process and neck region, which is nonspecific but can be seen with acute interstitial pancreatitis. Correlate clinically and with serum lipase levels. Spleen: Within normal limits. No focal lesion. Adrenals/Urinary Tract: There are bilateral adrenal adenomas. On the left it measures up to 1.0 x 1.6 cm and on the right it measures up to 0.9 x 1.2 cm. No suspicious renal mass. There is a 1.3 x 1.6 cm simple cyst arising from the left kidney upper pole. There is a 6 x 10 mm nonobstructing calculus in the left kidney upper pole and an additional 2 mm nonobstructing calculus in the left kidney lower pole. No other nephroureterolithiasis on either side. No obstructive uropathy on either side. Urinary bladder is under distended, precluding optimal assessment. However, no large mass or stones identified. No perivesical fat stranding. Stomach/Bowel: No disproportionate dilation of the small or large bowel loops. No evidence of abnormal bowel wall thickening. There is subtle fat stranding surrounding the proximal duodenum, which is nonspecific but typically seen as sequela of pancreatitis or duodenitis. Correlate clinically. The appendix is unremarkable. Vascular/Lymphatic: No ascites or pneumoperitoneum. No abdominal or pelvic lymphadenopathy, by size criteria. No aneurysmal dilation of the major abdominal arteries. Reproductive: Normal size prostate. Symmetric seminal vesicles. Other: There is a tiny fat containing umbilical hernia. There are bilateral small fat containing inguinal hernias. Musculoskeletal: No suspicious osseous lesions. There are mild multilevel degenerative changes in the visualized spine. IMPRESSION: 1. There is an ill-defined, hypoenhancing, approximately 1.6 x 1.9 cm lesion in the pancreatic head, which is highly concerning for pancreatic malignancy. There is resultant moderate upstream main pancreatic duct dilation, measuring up to 8 mm in the neck  region. There is also resultant up to moderate narrowing of the portal vein at the level of confluence of SMV/splenic veins. 2. There are multiple (more than 15), ill-defined hypoattenuating masses throughout the liver with largest in the left hepatic lobe, segment 4B/3 junction region measuring up to 3.0 x 3.2 cm. These are compatible with metastases. 3. There is subtle fat stranding surrounding the pancreatic head/uncinate process and neck region, which is nonspecific but can be seen with acute interstitial pancreatitis. There is subtle fat stranding surrounding the proximal duodenum, which is typically seen as sequela of pancreatitis or duodenitis. 4. No metastatic disease identified within the chest.  5. Multiple other nonacute observations, as described above. Aortic Atherosclerosis (ICD10-I70.0). Electronically Signed   By: Ree Molt M.D.   On: 03/09/2024 10:17   CT CHEST W CONTRAST Result Date: 03/09/2024 CLINICAL DATA:  Liver mass on right upper quadrant ultrasound, chest pain. * Tracking Code: BO * EXAM: CT CHEST WITH CONTRAST CT ABDOMEN WITHOUT AND WITH CONTRAST TECHNIQUE: Multidetector CT imaging of the abdomen was performed without intravenous contrast. Multidetector CT imaging of the chest and abdomen was then performed during bolus administration of intravenous contrast. RADIATION DOSE REDUCTION: This exam was performed according to the departmental dose-optimization program which includes automated exposure control, adjustment of the mA and/or kV according to patient size and/or use of iterative reconstruction technique. CONTRAST:  75mL OMNIPAQUE  IOHEXOL  350 MG/ML SOLN COMPARISON:  Ultrasound abdomen from earlier the same day. FINDINGS: CT CHEST FINDINGS Cardiovascular: Normal cardiac size. No pericardial effusion. No aortic aneurysm. There are coronary artery calcifications, in keeping with coronary artery disease. Mediastinum/Nodes: Visualized thyroid  gland appears grossly unremarkable. No  solid / cystic mediastinal masses. The esophagus is nondistended precluding optimal assessment. No axillary, mediastinal or hilar lymphadenopathy by size criteria. Lungs/Pleura: The central tracheo-bronchial tree is patent. There are dependent changes in bilateral lungs. No mass or consolidation. No pleural effusion or pneumothorax. No suspicious lung nodules. Musculoskeletal: The visualized soft tissues of the chest wall are grossly unremarkable. No suspicious osseous lesions. CT ABDOMEN PELVIS FINDINGS Hepatobiliary: The liver is normal in size. There is liver surface irregularity/nodularity, suggesting cirrhosis cirrhotic appearance due to underlying liver lesions described as follows. There are multiple (more than 15), ill-defined hypoattenuating masses throughout the liver with largest in the left hepatic lobe, segment 4 B/3 junction region measuring up to 3.0 x 3.2 cm. No intrahepatic or extrahepatic bile duct dilation. No calcified gallstones. Normal gallbladder wall thickness. No pericholecystic inflammatory changes. Pancreas: There is an ill-defined, hypoenhancing, approximately 1.6 x 1.9 cm lesion in the pancreatic head, which is highly concerning for pancreatic malignancy. There is resultant moderate upstream main pancreatic duct dilation, measuring up to 8 mm in the neck region. No peripancreatic fat stranding. There is resultant up to moderate narrowing of the portal vein at the level of confluence of SMV/splenic veins (series 7, image 66). Rest of the main portal vein and its tributaries are otherwise normal in caliber and patent. Unremarkable hepatic veins. There is subtle fat stranding surrounding the pancreatic head/uncinate process and neck region, which is nonspecific but can be seen with acute interstitial pancreatitis. Correlate clinically and with serum lipase levels. Spleen: Within normal limits. No focal lesion. Adrenals/Urinary Tract: There are bilateral adrenal adenomas. On the left it  measures up to 1.0 x 1.6 cm and on the right it measures up to 0.9 x 1.2 cm. No suspicious renal mass. There is a 1.3 x 1.6 cm simple cyst arising from the left kidney upper pole. There is a 6 x 10 mm nonobstructing calculus in the left kidney upper pole and an additional 2 mm nonobstructing calculus in the left kidney lower pole. No other nephroureterolithiasis on either side. No obstructive uropathy on either side. Urinary bladder is under distended, precluding optimal assessment. However, no large mass or stones identified. No perivesical fat stranding. Stomach/Bowel: No disproportionate dilation of the small or large bowel loops. No evidence of abnormal bowel wall thickening. There is subtle fat stranding surrounding the proximal duodenum, which is nonspecific but typically seen as sequela of pancreatitis or duodenitis. Correlate clinically. The appendix is unremarkable. Vascular/Lymphatic: No ascites  or pneumoperitoneum. No abdominal or pelvic lymphadenopathy, by size criteria. No aneurysmal dilation of the major abdominal arteries. Reproductive: Normal size prostate. Symmetric seminal vesicles. Other: There is a tiny fat containing umbilical hernia. There are bilateral small fat containing inguinal hernias. Musculoskeletal: No suspicious osseous lesions. There are mild multilevel degenerative changes in the visualized spine. IMPRESSION: 1. There is an ill-defined, hypoenhancing, approximately 1.6 x 1.9 cm lesion in the pancreatic head, which is highly concerning for pancreatic malignancy. There is resultant moderate upstream main pancreatic duct dilation, measuring up to 8 mm in the neck region. There is also resultant up to moderate narrowing of the portal vein at the level of confluence of SMV/splenic veins. 2. There are multiple (more than 15), ill-defined hypoattenuating masses throughout the liver with largest in the left hepatic lobe, segment 4B/3 junction region measuring up to 3.0 x 3.2 cm. These are  compatible with metastases. 3. There is subtle fat stranding surrounding the pancreatic head/uncinate process and neck region, which is nonspecific but can be seen with acute interstitial pancreatitis. There is subtle fat stranding surrounding the proximal duodenum, which is typically seen as sequela of pancreatitis or duodenitis. 4. No metastatic disease identified within the chest. 5. Multiple other nonacute observations, as described above. Aortic Atherosclerosis (ICD10-I70.0). Electronically Signed   By: Ree Molt M.D.   On: 03/09/2024 10:17   US  Abdomen Limited RUQ (LIVER/GB) Result Date: 03/09/2024 CLINICAL DATA:  [id number].  Elevated liver enzymes. EXAM: ULTRASOUND ABDOMEN LIMITED RIGHT UPPER QUADRANT COMPARISON:  Ultrasound complete abdomen from 12/23/2018. FINDINGS: Gallbladder: No gallstones or wall thickening visualized. The gallbladder is mildly dilated measuring 11 cm in length. There is a small amount of hypoechoic layering sludge. No sonographic Murphy sign noted by sonographer. Common bile duct: Diameter: Measures prominent at 7.4 mm mild intrahepatic biliary prominence. Liver: Not seen in 2020, there is a heterogeneously hypoechoic solid mass with scattered color flow in the left lobe measuring 3.5 x 2.9 x 3.3 cm, worrisome for primary or metastatic neoplasm. CT or MRI recommended preferably without and with contrast. Otherwise, the liver is within normal limits in parenchymal echogenicity. Portal vein is patent on color Doppler imaging with normal direction of blood flow towards the liver. Other: No right upper quadrant ascites. IMPRESSION: 1. 3.5 x 2.9 x 3.3 cm heterogeneously hypoechoic solid mass in the left lobe of the liver, worrisome for primary or metastatic neoplasm. CT or MRI recommended preferably without and with contrast. 2. Mildly dilated gallbladder with a small amount of sludge. No gallstones or wall thickening. 3. Prominent common bile duct at 7.4 mm with mild intrahepatic  biliary prominence. Electronically Signed   By: Francis Quam M.D.   On: 03/09/2024 07:46   DG Ribs Unilateral W/Chest Left Result Date: 03/09/2024 CLINICAL DATA:  53 year old male with back and rib pain for 1 month. EXAM: LEFT RIBS AND CHEST - 3+ VIEW COMPARISON:  None Available. FINDINGS: PA view of the chest 0457 hours. Low normal lung volumes. Normal cardiac size and mediastinal contours. Visualized tracheal air column is within normal limits. Both lungs appear clear. No pneumothorax or pleural effusion. Four oblique views of the left ribs. Bone mineralization is within normal limits. 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 EKG leads project over the left chest. No left rib fracture or rib lesion identified. Other visible osseous structures appear intact. Negative visible bowel gas pattern. However, left abdominal oval, crescent-shaped 13 mm calculus is visible on multiple views, suspicious for left nephrolithiasis. IMPRESSION: 1. No acute cardiopulmonary abnormality.  Negative left radiographs. 2. Evidence of bulky 13 mm left renal calculus.  Query renal colic. Electronically Signed   By: VEAR Hurst M.D.   On: 03/09/2024 05:32     Subjective: Patient seen examined bedside, sitting in bedside chair.  No complaints.  Tolerating diet.  No abdominal pain.  Ready for discharge home.  Discussed will continue antibiotics for Citrobacter bacteremia/cholangitis on discharge.  Needs close follow-up with his oncologist, Dr. Cloretta.  No other questions or concerns at this time.  Denies headache, no dizziness, no chest pain, no palpitations, no shortness of breath, no abdominal pain, no fever/chills/night sweats, no nausea/vomiting/diarrhea, no focal weakness, no fatigue, no paresthesia.  No acute events overnight per nursing staff.  Discharge Exam: Vitals:   03/29/24 2115 03/30/24 0522  BP: 125/85 (!) 138/90  Pulse: 72 77  Resp: 18 20  Temp: 98 F (36.7 C) 98.3 F (36.8 C)  SpO2: 99% 97%   Vitals:   03/29/24  1025 03/29/24 1247 03/29/24 2115 03/30/24 0522  BP: 117/75 (!) 124/93 125/85 (!) 138/90  Pulse:  73 72 77  Resp:   18 20  Temp:  99.1 F (37.3 C) 98 F (36.7 C) 98.3 F (36.8 C)  TempSrc:  Oral Oral Oral  SpO2:  99% 99% 97%  Weight:      Height:        Physical Exam: GEN: NAD, alert and oriented x 3, wd/wn HEENT: NCAT, PERRL, EOMI, sclera clear, MMM PULM: CTAB w/o wheezes/crackles, normal respiratory effort, on room air CV: RRR w/o M/G/R GI: abd soft, NTND, + BS MSK: no peripheral edema, moves all extremities independently with preserved muscle strength NEURO: No focal neurological deficit PSYCH: normal mood/affect Integumentary: dry/intact, no rashes or wounds    The results of significant diagnostics from this hospitalization (including imaging, microbiology, ancillary and laboratory) are listed below for reference.     Microbiology: Recent Results (from the past 240 hours)  Culture, blood (Routine X 2) w Reflex to ID Panel     Status: Abnormal (Preliminary result)   Collection Time: 03/27/24 11:30 AM   Specimen: BLOOD LEFT FOREARM  Result Value Ref Range Status   Specimen Description   Final    BLOOD LEFT FOREARM Performed at [facility], [address] [place], [place] [address]    Special Requests   Final    BOTTLES DRAWN AEROBIC AND ANAEROBIC Blood Culture results may not be optimal due to an inadequate volume of blood received in culture bottles Performed at [facility], [address], [place], [place] [address]    Culture  Setup Time   Final    GRAM NEGATIVE RODS IN BOTH AEROBIC AND ANAEROBIC BOTTLES CRITICAL RESULT CALLED TO, READ BACK BY AND VERIFIED WITH: MICHELLE LILLYSTONE PHARM D 03/29/2024 0441 BY DD    Culture (A)  Final    CITROBACTER KOSERI SUSCEPTIBILITIES TO FOLLOW Performed at [facility], [address] [place], [place] [address]    Report Status PENDING  Incomplete  Blood Culture ID Panel (Reflexed)      Status: Abnormal   Collection Time: 03/27/24 11:30 AM  Result Value Ref Range Status   Enterococcus faecalis NOT DETECTED NOT DETECTED Final   Enterococcus Faecium NOT DETECTED NOT DETECTED Final   Listeria monocytogenes NOT DETECTED NOT DETECTED Final   Staphylococcus species NOT DETECTED NOT DETECTED Final   Staphylococcus aureus (BCID) NOT DETECTED NOT DETECTED Final   Staphylococcus epidermidis NOT DETECTED NOT DETECTED  Final   Staphylococcus lugdunensis NOT DETECTED NOT DETECTED Final   Streptococcus species NOT DETECTED NOT DETECTED Final   Streptococcus agalactiae NOT DETECTED NOT DETECTED Final   Streptococcus pneumoniae NOT DETECTED NOT DETECTED Final   Streptococcus pyogenes NOT DETECTED NOT DETECTED Final   A.calcoaceticus-baumannii NOT DETECTED NOT DETECTED Final   Bacteroides fragilis NOT DETECTED NOT DETECTED Final   Enterobacterales DETECTED (A) NOT DETECTED Final    Comment: Enterobacterales represent a large order of gram negative bacteria, not a single organism. Refer to culture for further identification. CRITICAL RESULT CALLED TO, READ BACK BY AND VERIFIED WITH: MICHELLE LILLYSTONE PHARM D 03/29/2024 0441 BY DD    Enterobacter cloacae complex NOT DETECTED NOT DETECTED Final   Escherichia coli NOT DETECTED NOT DETECTED Final   Klebsiella aerogenes NOT DETECTED NOT DETECTED Final   Klebsiella oxytoca NOT DETECTED NOT DETECTED Final   Klebsiella pneumoniae NOT DETECTED NOT DETECTED Final   Proteus species NOT DETECTED NOT DETECTED Final   Salmonella species NOT DETECTED NOT DETECTED Final   Serratia marcescens NOT DETECTED NOT DETECTED Final   Haemophilus influenzae NOT DETECTED NOT DETECTED Final   Neisseria meningitidis NOT DETECTED NOT DETECTED Final   Pseudomonas aeruginosa NOT DETECTED NOT DETECTED Final   Stenotrophomonas maltophilia NOT DETECTED NOT DETECTED Final   Candida albicans NOT DETECTED NOT DETECTED Final   Candida auris NOT DETECTED NOT DETECTED  Final   Candida glabrata NOT DETECTED NOT DETECTED Final   Candida krusei NOT DETECTED NOT DETECTED Final   Candida parapsilosis NOT DETECTED NOT DETECTED Final   Candida tropicalis NOT DETECTED NOT DETECTED Final   Cryptococcus neoformans/gattii NOT DETECTED NOT DETECTED Final   CTX-M ESBL NOT DETECTED NOT DETECTED Final   Carbapenem resistance IMP NOT DETECTED NOT DETECTED Final   Carbapenem resistance KPC NOT DETECTED NOT DETECTED Final   Carbapenem resistance NDM NOT DETECTED NOT DETECTED Final   Carbapenem resist OXA 48 LIKE NOT DETECTED NOT DETECTED Final   Carbapenem resistance VIM NOT DETECTED NOT DETECTED Final    Comment: Performed at [facility], [address], [place], [place] [address]  Culture, blood (Routine X 2) w Reflex to ID Panel     Status: None (Preliminary result)   Collection Time: 03/27/24 11:35 AM   Specimen: BLOOD LEFT FOREARM  Result Value Ref Range Status   Specimen Description   Final    BLOOD LEFT FOREARM Performed at [facility], [address], [place], [place] [address]    Special Requests   Final    BOTTLES DRAWN AEROBIC AND ANAEROBIC Blood Culture results may not be optimal due to an inadequate volume of blood received in culture bottles Performed at [facility], [address] [place], [place] [address]    Culture   Final    NO GROWTH 3 DAYS Performed at [facility], [address], [place], [place] [address]    Report Status PENDING  Incomplete     Labs: BNP (last 3 results) No results for input(s): BNP in the last 8760 hours. Basic Metabolic Panel: Recent Labs  Lab 03/26/24 1330 03/27/24 0209 03/28/24 0510 03/29/24 0522 03/30/24 0534  NA 138 137 138 137 139  K 3.9 3.6 4.0 4.1 3.7  CL 102 100 101 102 104  CO2 25 26 26 27 26   GLUCOSE 109* 164* 114* 122* 123*  BUN 16 16 16 18 15   CREATININE 1.25* 1.21 1.48* 1.32* 1.24  CALCIUM 9.5 9.4 9.0  9.0 8.9  MG 1.9  --   --   --   --    Liver  Function Tests: Recent Labs  Lab 03/26/24 1330 03/27/24 0209 03/28/24 0510 03/29/24 0522 03/30/24 0534  AST 178* 164* 110* 72* 54*  ALT 497* 495* 352* 238* 198*  ALKPHOS 305* 316* 270* 217* 210*  BILITOT 11.1* 12.3* 16.5* 14.1* 11.7*  PROT 6.9 7.0 6.7 6.0* 6.2*  ALBUMIN 3.6 3.9 3.6 3.1* 3.2*   Recent Labs  Lab 03/27/24 0209 03/29/24 0522  LIPASE 288* 75*   No results for input(s): AMMONIA in the last 168 hours. CBC: Recent Labs  Lab 03/24/24 0845 03/24/24 1852 03/25/24 0530 03/26/24 1330 03/27/24 0209 03/28/24 0510 03/29/24 0522  WBC 4.9   < > 4.8 5.9 7.9 9.1 8.4  NEUTROABS 3.6  --   --  4.4 6.5  --   --   HGB 10.8*   < > 10.3* 10.2* 11.0* 10.1* 8.8*  HCT 32.4*   < > 32.6* 31.7* 34.6* 31.6* 26.8*  MCV 60.9*   < > 61.0* 60.5* 60.7* 60.5* 59.8*  PLT 216   < > 209 212 217 172 149*   < > = values in this interval not displayed.   Cardiac Enzymes: No results for input(s): CKTOTAL, CKMB, CKMBINDEX, TROPONINI in the last 168 hours. BNP: Invalid input(s): POCBNP CBG: Recent Labs  Lab 03/25/24 2140  GLUCAP 268*   D-Dimer No results for input(s): DDIMER in the last 72 hours. Hgb A1c No results for input(s): HGBA1C in the last 72 hours. Lipid Profile No results for input(s): CHOL, HDL, LDLCALC, TRIG, CHOLHDL, LDLDIRECT in the last 72 hours. Thyroid  function studies No results for input(s): TSH, T4TOTAL, T3FREE, THYROIDAB in the last 72 hours.  Invalid input(s): FREET3 Anemia work up No results for input(s): VITAMINB12, FOLATE, FERRITIN, TIBC, IRON, RETICCTPCT in the last 72 hours. Urinalysis    Component Value Date/Time   COLORURINE AMBER (A) 03/27/2024 1057   APPEARANCEUR CLEAR 03/27/2024 1057   LABSPEC 1.038 (H) 03/27/2024 1057   PHURINE 5.0 03/27/2024 1057   GLUCOSEU NEGATIVE 03/27/2024 1057   HGBUR MODERATE (A) 03/27/2024 1057   BILIRUBINUR MODERATE (A) 03/27/2024 1057   KETONESUR NEGATIVE 03/27/2024  1057   PROTEINUR NEGATIVE 03/27/2024 1057   NITRITE NEGATIVE 03/27/2024 1057   LEUKOCYTESUR NEGATIVE 03/27/2024 1057   Sepsis Labs Recent Labs  Lab 03/26/24 1330 03/27/24 0209 03/28/24 0510 03/29/24 0522  WBC 5.9 7.9 9.1 8.4   Microbiology Recent Results (from the past 240 hours)  Culture, blood (Routine X 2) w Reflex to ID Panel     Status: Abnormal (Preliminary result)   Collection Time: 03/27/24 11:30 AM   Specimen: BLOOD LEFT FOREARM  Result Value Ref Range Status   Specimen Description   Final    BLOOD LEFT FOREARM Performed at [facility], [address], [place], [place] [address]    Special Requests   Final    BOTTLES DRAWN AEROBIC AND ANAEROBIC Blood Culture results may not be optimal due to an inadequate volume of blood received in culture bottles Performed at [facility], [address] [place], [place] [address]    Culture  Setup Time   Final    GRAM NEGATIVE RODS IN BOTH AEROBIC AND ANAEROBIC BOTTLES CRITICAL RESULT CALLED TO, READ BACK BY AND VERIFIED WITH: MICHELLE LILLYSTONE PHARM D 03/29/2024 0441 BY DD    Culture (A)  Final    CITROBACTER KOSERI SUSCEPTIBILITIES TO FOLLOW Performed  at [facility], [address], [place], [place] [address]    Report Status PENDING  Incomplete  Blood Culture ID Panel (Reflexed)     Status: Abnormal   Collection Time: 03/27/24 11:30 AM  Result Value Ref Range Status   Enterococcus faecalis NOT DETECTED NOT DETECTED Final   Enterococcus Faecium NOT DETECTED NOT DETECTED Final   Listeria monocytogenes NOT DETECTED NOT DETECTED Final   Staphylococcus species NOT DETECTED NOT DETECTED Final   Staphylococcus aureus (BCID) NOT DETECTED NOT DETECTED Final   Staphylococcus epidermidis NOT DETECTED NOT DETECTED Final   Staphylococcus lugdunensis NOT DETECTED NOT DETECTED Final   Streptococcus species NOT DETECTED NOT DETECTED Final   Streptococcus agalactiae NOT DETECTED NOT DETECTED Final    Streptococcus pneumoniae NOT DETECTED NOT DETECTED Final   Streptococcus pyogenes NOT DETECTED NOT DETECTED Final   A.calcoaceticus-baumannii NOT DETECTED NOT DETECTED Final   Bacteroides fragilis NOT DETECTED NOT DETECTED Final   Enterobacterales DETECTED (A) NOT DETECTED Final    Comment: Enterobacterales represent a large order of gram negative bacteria, not a single organism. Refer to culture for further identification. CRITICAL RESULT CALLED TO, READ BACK BY AND VERIFIED WITH: MICHELLE LILLYSTONE PHARM D 03/29/2024 0441 BY DD    Enterobacter cloacae complex NOT DETECTED NOT DETECTED Final   Escherichia coli NOT DETECTED NOT DETECTED Final   Klebsiella aerogenes NOT DETECTED NOT DETECTED Final   Klebsiella oxytoca NOT DETECTED NOT DETECTED Final   Klebsiella pneumoniae NOT DETECTED NOT DETECTED Final   Proteus species NOT DETECTED NOT DETECTED Final   Salmonella species NOT DETECTED NOT DETECTED Final   Serratia marcescens NOT DETECTED NOT DETECTED Final   Haemophilus influenzae NOT DETECTED NOT DETECTED Final   Neisseria meningitidis NOT DETECTED NOT DETECTED Final   Pseudomonas aeruginosa NOT DETECTED NOT DETECTED Final   Stenotrophomonas maltophilia NOT DETECTED NOT DETECTED Final   Candida albicans NOT DETECTED NOT DETECTED Final   Candida auris NOT DETECTED NOT DETECTED Final   Candida glabrata NOT DETECTED NOT DETECTED Final   Candida krusei NOT DETECTED NOT DETECTED Final   Candida parapsilosis NOT DETECTED NOT DETECTED Final   Candida tropicalis NOT DETECTED NOT DETECTED Final   Cryptococcus neoformans/gattii NOT DETECTED NOT DETECTED Final   CTX-M ESBL NOT DETECTED NOT DETECTED Final   Carbapenem resistance IMP NOT DETECTED NOT DETECTED Final   Carbapenem resistance KPC NOT DETECTED NOT DETECTED Final   Carbapenem resistance NDM NOT DETECTED NOT DETECTED Final   Carbapenem resist OXA 48 LIKE NOT DETECTED NOT DETECTED Final   Carbapenem resistance VIM NOT DETECTED NOT  DETECTED Final    Comment: Performed at [facility], [address], [place], [place] [address]  Culture, blood (Routine X 2) w Reflex to ID Panel     Status: None (Preliminary result)   Collection Time: 03/27/24 11:35 AM   Specimen: BLOOD LEFT FOREARM  Result Value Ref Range Status   Specimen Description   Final    BLOOD LEFT FOREARM Performed at [facility], [address], [place], [place] [address]    Special Requests   Final    BOTTLES DRAWN AEROBIC AND ANAEROBIC Blood Culture results may not be optimal due to an inadequate volume of blood received in culture bottles Performed at [facility], [address], [place], [place] [address]    Culture   Final    NO GROWTH 3 DAYS Performed at [facility], [address], [place],  [place] [address]    Report Status PENDING  Incomplete     Time coordinating discharge: Over 30 minutes  SIGNED:   Camellia PARAS [place], DO  [facility] 03/30/2024, 12:46 PM

## 2024-03-31 ENCOUNTER — Other Ambulatory Visit: Payer: Self-pay

## 2024-03-31 ENCOUNTER — Telehealth: Payer: Self-pay

## 2024-03-31 ENCOUNTER — Other Ambulatory Visit: Payer: Self-pay | Admitting: Oncology

## 2024-03-31 DIAGNOSIS — K831 Obstruction of bile duct: Secondary | ICD-10-CM

## 2024-03-31 DIAGNOSIS — C250 Malignant neoplasm of head of pancreas: Secondary | ICD-10-CM

## 2024-03-31 LAB — CULTURE, BLOOD (ROUTINE X 2)

## 2024-03-31 NOTE — Telephone Encounter (Signed)
-----   Message from Cathryne PARAS May sent at 03/31/2024 10:37 AM EDT ----- Regarding: RE: Appt on 10/15 Yes we can do that. Please pod b put in lab order  Deanna, NP ----- Message ----- From: Nori Sari SQUIBB, RN Sent: 03/31/2024  10:07 AM EDT To: Cristino LOISE Single, RN; Cathryne PARAS May, NP Subject: Appt on 10/15                                  Good morning,  Mr Berendt is coming for appt with you 10/15 and he had a biliary stent placed last week in hospital, Dr Zandra is seeing him back on 10/20 to begin chemo but wanted to check his CMET this week, can he get those labs with you on Wednesday?  If so please let me know what you need from our team.  Thank you Sari CORDIA [facility] nurse navigator [phone]

## 2024-03-31 NOTE — Progress Notes (Unsigned)
 SABRA

## 2024-03-31 NOTE — Telephone Encounter (Signed)
 Order placed in Epic. Pt made aware. Location to lab provided. Pt verbalized understanding with all questions answered.

## 2024-04-01 ENCOUNTER — Other Ambulatory Visit: Payer: Self-pay

## 2024-04-01 DIAGNOSIS — C259 Malignant neoplasm of pancreas, unspecified: Secondary | ICD-10-CM

## 2024-04-01 LAB — CULTURE, BLOOD (ROUTINE X 2): Culture: NO GROWTH

## 2024-04-02 ENCOUNTER — Encounter: Payer: Self-pay | Admitting: Gastroenterology

## 2024-04-02 ENCOUNTER — Other Ambulatory Visit: Payer: Self-pay | Admitting: Oncology

## 2024-04-02 ENCOUNTER — Other Ambulatory Visit

## 2024-04-02 ENCOUNTER — Ambulatory Visit: Admitting: Gastroenterology

## 2024-04-02 ENCOUNTER — Encounter: Payer: Self-pay | Admitting: Genetic Counselor

## 2024-04-02 VITALS — BP 102/70 | HR 74 | Ht 76.0 in | Wt 195.6 lb

## 2024-04-02 DIAGNOSIS — R634 Abnormal weight loss: Secondary | ICD-10-CM | POA: Diagnosis not present

## 2024-04-02 DIAGNOSIS — Z1379 Encounter for other screening for genetic and chromosomal anomalies: Secondary | ICD-10-CM | POA: Insufficient documentation

## 2024-04-02 DIAGNOSIS — K831 Obstruction of bile duct: Secondary | ICD-10-CM | POA: Diagnosis not present

## 2024-04-02 DIAGNOSIS — K858 Other acute pancreatitis without necrosis or infection: Secondary | ICD-10-CM | POA: Diagnosis not present

## 2024-04-02 DIAGNOSIS — C259 Malignant neoplasm of pancreas, unspecified: Secondary | ICD-10-CM

## 2024-04-02 DIAGNOSIS — C250 Malignant neoplasm of head of pancreas: Secondary | ICD-10-CM

## 2024-04-02 DIAGNOSIS — R143 Flatulence: Secondary | ICD-10-CM | POA: Diagnosis not present

## 2024-04-02 DIAGNOSIS — K8309 Other cholangitis: Secondary | ICD-10-CM

## 2024-04-02 DIAGNOSIS — C801 Malignant (primary) neoplasm, unspecified: Secondary | ICD-10-CM

## 2024-04-02 LAB — COMPREHENSIVE METABOLIC PANEL WITH GFR
ALT: 123 U/L — ABNORMAL HIGH (ref 0–53)
AST: 47 U/L — ABNORMAL HIGH (ref 0–37)
Albumin: 3.9 g/dL (ref 3.5–5.2)
Alkaline Phosphatase: 223 U/L — ABNORMAL HIGH (ref 39–117)
BUN: 17 mg/dL (ref 6–23)
CO2: 28 meq/L (ref 19–32)
Calcium: 9.5 mg/dL (ref 8.4–10.5)
Chloride: 98 meq/L (ref 96–112)
Creatinine, Ser: 1.29 mg/dL (ref 0.40–1.50)
GFR: 63.04 mL/min (ref >60.00)
Glucose, Bld: 181 mg/dL — ABNORMAL HIGH (ref 70–99)
Potassium: 3.5 meq/L (ref 3.5–5.1)
Sodium: 136 meq/L (ref 135–145)
Total Bilirubin: 13.5 mg/dL — ABNORMAL HIGH (ref 0.2–1.2)
Total Protein: 7.7 g/dL (ref 6.0–8.3)

## 2024-04-02 LAB — CBC WITH DIFFERENTIAL/PLATELET
Basophils Relative: 0 % (ref 0.0–3.0)
Eosinophils Relative: 3 % (ref 0.0–5.0)
HCT: 35.4 % — ABNORMAL LOW (ref 39.0–52.0)
Hemoglobin: 11.2 g/dL — ABNORMAL LOW (ref 13.0–17.0)
Lymphocytes Relative: 17 % (ref 12.0–46.0)
MCHC: 31.7 g/dL (ref 30.0–36.0)
MCV: 61.5 fl — ABNORMAL LOW (ref 78.0–100.0)
Monocytes Relative: 10 % (ref 3.0–12.0)
Neutro Abs: 3.4 K/uL (ref 1.4–7.7)
Neutrophils Relative %: 70 % (ref 43.0–77.0)
Platelets: 280 K/uL (ref 150.0–400.0)
RBC: 5.75 Mil/uL (ref 4.22–5.81)
RDW: 16 % — ABNORMAL HIGH (ref 11.5–15.5)
WBC: 9.4 K/uL (ref 4.0–10.5)

## 2024-04-02 LAB — LIPASE: Lipase: 86 U/L — ABNORMAL HIGH (ref 11.0–59.0)

## 2024-04-02 NOTE — Progress Notes (Signed)
 Chief Complaint:hospital follow-up Primary GI Doctor: (previously Dr. Eda) Dr. Abran  YEP:Douglas Harper is a 53 y.o. male with a new diagnosis of   Was supposed start chemotherapy on 10/6 , but LFTs found to be significantly elevated. LFTs elevated most notably for total bilirubin 11.3 up from 3.4 just 10 days ago.  Alk phos 267, ALT 689, AST 296. CT scan abdomen and pelvis with contrast 03/24/2024: 1. The pancreatic head/uncinate process primary and liver metastasis are relatively similar. 2. Increase in mild to moderate biliary duct dilatation. 3. Incidental findings, including: Left nephrolithiasis. Cholelithiasis.  03/25/24 ERCP:Malignant obstructive jaundice secondary to distal biliary stricture status post biliary sphincterotomy and self-expanding metal stent placement.  03/26/24: Biopsy of liver lesion with IR.  03/26/24  Discharged home, but presented back same day with complaints of abdominal pain.  Lipase slightly elevated and findings of acute pancreatitis on CT scan.  Also appears the SEMS Santi Troung be in the cystic duct.  LFTs still elevated.  Lipase was 288.  Total bili 12.3, alk phos 316, ALT 495, AST 164.  Hemoglobin 11.0 g.  White blood cell count normal.  Platelets normal.  03/28/24 ERCP with stent exchange 2 days ago. He had evidence of cholangitis. Antibiotics started.  Appt with Dr. Cloretta on 10/20 to start chemotherapy.  Interval History     Patient presents for follow-up after recent hospitalization. Accompanied by family member.  Patient reports overall he is doing okay however has been slowly incorporating foods into his daily regimen out of fear and Jag Lenz cause problems.  Patient reports if he eats something that is more solid he gets a lot of gas buildup in the lower quadrants which is uncomfortable.  Patient states he tries to walk after meals to help. Patient is currently only consuming mostly soft foods like eggs and potatoes along with broths and liquids.  Patient will  be finishing the ciprofloxacin on Sunday as prescribed on discharge.  Patient denies any nausea or vomiting.  Patient denies any fever or chills.  Patient states his last bowel movement was on Sunday.  Patient states his stools are a light tannish-brown.  Patient notes his urine used to be dark however he has a lighter yellow.  He is taking hydromorphone  prn pain, reports he typically takes only once a day.  He has not had to take ondansetron .   The patient and family's main questions today was about his diet and what he is allowed to consume to prevent further weight loss as well as what he can take for the gas discomfort.  They also wanted to know how we would know if the stent placed is functioning well.  We did spend several minutes discussing his lab work and the levels trending downward.  The plan will be to recheck today.  Wt Readings from Last 3 Encounters:  04/02/24 195 lb 9.6 oz (88.7 kg)  03/28/24 202 lb 6.1 oz (91.8 kg)  03/25/24 202 lb 6.4 oz (91.8 kg)    Past Medical History:  Diagnosis Date   COVID-19    03/17/20   FH: heart disease    GERD (gastroesophageal reflux disease)    Glaucoma    Hypertension    Hyperthyroidism    s/p RAI 2009    Hypothyroidism    Verruca vulgaris     Past Surgical History:  Procedure Laterality Date   ANTERIOR CRUCIATE LIGAMENT REPAIR Right 2000   ERCP N/A 03/25/2024   Procedure: ERCP, WITH INTERVENTION IF INDICATED;  Surgeon: Abran Norleen SAILOR, MD;  Location: [facility];  Service: Gastroenterology;  Laterality: N/A;   ERCP N/A 03/28/2024   Procedure: ERCP, WITH INTERVENTION IF INDICATED;  Surgeon: Abran Norleen SAILOR, MD;  Location: [facility];  Service: Gastroenterology;  Laterality: N/A;   HAND SURGERY Right 2001   with hardware implanted   IR IMAGING GUIDED PORT INSERTION  03/17/2024   NO PAST SURGERIES      Current Outpatient Medications  Medication Sig Dispense Refill   amLODipine  (NORVASC ) 10 MG tablet Take 1 tablet (10 mg total) by  mouth daily. 30 tablet 1   ciprofloxacin (CIPRO) 500 MG tablet Take 1 tablet (500 mg total) by mouth 2 (two) times daily for 7 days. 14 tablet 0   HYDROmorphone  (DILAUDID ) 2 MG tablet Take 0.5 tablets (1 mg total) by mouth every 6 (six) hours as needed for severe pain (pain score 7-10). 30 tablet 0   levothyroxine  (SYNTHROID ) 125 MCG tablet TAKE 1 TABLET BY MOUTH 30 MINUTES BEFORE BREAKFAST (STOP DOSE) 90 tablet 1   lidocaine -prilocaine (EMLA) cream Apply 1 Application topically as needed. Apply 1 Application topically as needed (Apply 1 hours prior to appt) (Patient taking differently: Apply 1 Application topically as needed (Patient waiting 14 days before use). Apply 1 Application topically as needed (Apply 1 hours prior to appt)) 30 g 0   omeprazole (PRILOSEC) 40 MG capsule Take 40 mg by mouth daily.     ondansetron  (ZOFRAN ) 4 MG tablet Take 1 tablet (4 mg total) by mouth every 8 (eight) hours as needed for nausea or vomiting. 20 tablet 0   prochlorperazine (COMPAZINE) 10 MG tablet Take 1 tablet (10 mg total) by mouth every 6 (six) hours as needed for nausea or vomiting. 30 tablet 0   No current facility-administered medications for this visit.    Allergies as of 04/02/2024   (No Known Allergies)    Family History  Problem Relation Age of Onset   Heart disease Mother    Hypertension Mother    Diabetes Mother    Colon cancer Neg Hx    Esophageal cancer Neg Hx    Pancreatic cancer Neg Hx    Liver disease Neg Hx    Rectal cancer Neg Hx    Stomach cancer Neg Hx     Review of Systems:    Constitutional: No weight loss, fever, chills, weakness or fatigue HEENT: Eyes: No change in vision               Ears, Nose, Throat:  No change in hearing or congestion Skin: No rash or itching Cardiovascular: No chest pain, chest pressure or palpitations   Respiratory: No SOB or cough Gastrointestinal: See HPI and otherwise negative Genitourinary: No dysuria or change in urinary  frequency Neurological: No headache, dizziness or syncope Musculoskeletal: No new muscle or joint pain Hematologic: No bleeding or bruising Psychiatric: No history of depression or anxiety    Physical Exam:  Vital signs: BP 102/70   Pulse 74   Ht 6' 4 (1.93 m)   Wt 195 lb 9.6 oz (88.7 kg)   BMI 23.81 kg/m   Constitutional:   Pleasant male appears to be in NAD, alert and cooperative Eyes:   PEERL, EOMI. Icterus noted. Conjunctiva pink. Throat: Oral cavity and pharynx without inflammation, swelling or lesion.  Respiratory: Respirations even and unlabored. Lungs clear to auscultation bilaterally.   No wheezes, crackles, or rhonchi.  Cardiovascular: Normal S1, S2. Regular rate and rhythm. No peripheral edema,  cyanosis or pallor.  Gastrointestinal:  Soft, nondistended, nontender. No rebound or guarding. Hypoactive bowel sounds.  Rectal:  Not performed.  Msk:  Symmetrical without gross deformities. Without edema, no deformity or joint abnormality.  Neurologic:  Alert and  oriented x4;  grossly normal neurologically.  Skin:   Dry and intact without significant lesions or rashes.  RELEVANT LABS AND IMAGING: CBC    Latest Ref Rng & Units 03/29/2024    5:22 AM 03/28/2024    5:10 AM 03/27/2024    2:09 AM  CBC  WBC 4.0 - 10.5 K/uL 8.4  9.1  7.9   Hemoglobin 13.0 - 17.0 g/dL 8.8  89.8  88.9   Hematocrit 39.0 - 52.0 % 26.8  31.6  34.6   Platelets 150 - 400 K/uL 149  172  217      CMP     Latest Ref Rng & Units 03/30/2024    5:34 AM 03/29/2024    5:22 AM 03/28/2024    5:10 AM  CMP  Glucose 70 - 99 mg/dL 876  877  885   BUN 6 - 20 mg/dL 15  18  16    Creatinine 0.61 - 1.24 mg/dL 8.75  8.67  8.51   Sodium 135 - 145 mmol/L 139  137  138   Potassium 3.5 - 5.1 mmol/L 3.7  4.1  4.0   Chloride 98 - 111 mmol/L 104  102  101   CO2 22 - 32 mmol/L 26  27  26    Calcium 8.9 - 10.3 mg/dL 8.9  9.0  9.0   Total Protein 6.5 - 8.1 g/dL 6.2  6.0  6.7   Total Bilirubin 0.0 - 1.2 mg/dL 88.2  85.8   83.4   Alkaline Phos 38 - 126 U/L 210  217  270   AST 15 - 41 U/L 54  72  110   ALT 0 - 44 U/L 198  238  352      Lab Results  Component Value Date   TSH 3.10 07/20/2022    Imaging: 03/09/24 CTAP IMPRESSION: 1. There is an ill-defined, hypoenhancing, approximately 1.6 x 1.9 cm lesion in the pancreatic head, which is highly concerning for pancreatic malignancy. There is resultant moderate upstream main pancreatic duct dilation, measuring up to 8 mm in the neck region. There is also resultant up to moderate narrowing of the portal vein at the level of confluence of SMV/splenic veins. 2. There are multiple (more than 15), ill-defined hypoattenuating masses throughout the liver with largest in the left hepatic lobe, segment 4B/3 junction region measuring up to 3.0 x 3.2 cm. These are compatible with metastases. 3. There is subtle fat stranding surrounding the pancreatic head/uncinate process and neck region, which is nonspecific but can be seen with acute interstitial pancreatitis. There is subtle fat stranding surrounding the proximal duodenum, which is typically seen as sequela of pancreatitis or duodenitis. 4. No metastatic disease identified within the chest. 5. Multiple other nonacute observations, as described above.  03/24/24 CTAP IMPRESSION: 1. The pancreatic head/uncinate process primary and liver metastasis are relatively similar. 2. Increase in mild to moderate biliary duct dilatation. 3. Incidental findings, including: Left nephrolithiasis. Cholelithiasis.  CT scan abdomen and pelvis with contrast 03/27/2024 IMPRESSION: 1. Interval placement of a palliative metallic internal biliary stent traversing the ampulla and extending into the cystic duct. This device does not clearly communicate with the common duct proximal to the obstructing mass, particularly when compared to prior examination where both ductal structures  are better delineated. 2. Stable intra and  extrahepatic biliary ductal dilation. 3. Stable bilobar hepatic metastatic disease. 4. Stable 4.7 cm hypoenhancing mass within the head of the pancreas. 5. Interval development of mild peripancreatic inflammatory stranding in keeping with changes of mild interstitial/edematous pancreatitis. No loculated peripancreatic fluid collections. No pancreatic or peripancreatic necrosis identified. 6. Stable encasement and marked narrowing of the main portal vein at the portomesenteric confluence. This vessel remains patent. 7. Mild hepatic steatosis. 8. Minimal left nonobstructing nephrolithiasis.   Procedures:  03/25/24 ERCP Impression: 1. Head of the pancreas cancer with double duct sign 2. Malignant obstructive jaundice secondary to distal biliary stricture status post biliary sphincterotomy and self- expanding metal stent placement 3. Difficult case as described  03/28/24 ERCP Impression: 1. Previously placed metal stent located in the cystic duct was removed 2. A new self- expanding metal stent was placed into the bile duct 3. Large volumes of purulent bile drained well.  Assessment: Encounter Diagnoses  Name Primary?   Pancreatic adenocarcinoma (HCC) Yes   Other acute pancreatitis, unspecified complication status    Biliary obstruction (HCC)    Obstructive jaundice due to cancer (HCC)    Acute cholangitis (HCC)    Loss of weight    Flatulence   Douglas Harper is a 54 y.o. male with metastatic pancreatic cancer who presents for hospital follow-up #1 Obstructive jaundice #2 Hx stage IV pancreatic adenocarcinoma #3 Cholangitis secondary to biliary obstruction, Status post ERCP with stent and replacement  (5 days ago) #4 Acute pancreatitis post ERCP #5 Anemia, microcytic, no overt bleeding Hemoglobin 10.1> 8.8 LFTs are trending downward Total bilirubin 16.5>14.1>11.7 No fevers. No abdominal pain. No N&V  Plan: -CBC, CMET, lipase today -complete ciprofloxacin 500mg  BID for 7 days   -discussed low fat, soft food diet, slowly advance as tolerated -Avoid gas forming foods, pamphlet handed out -OTC gas-x as needed -follow-up with oncology as scheduled 10/20 -ER precautions given  Thank you for the courtesy of this consult. Please call me with any questions or concerns.   Rajni Holsworth, FNP-C [facility] 04/02/2024, 12:24 PM  Cc: Mercer Clotilda SAUNDERS, MD

## 2024-04-02 NOTE — Patient Instructions (Addendum)
 Gas Walk after meals OTC gas-x as needed Pamphlet provide more information on eating habits  Pancreatitis Dietary tips: Eat 4-6 small meals throughout the day  Follow low fat diet Bake, grill, roast and/or steam foods. Do not fry or stir fry foods.  Slowly add in lean protein chicken, fish, low fat/non fat dairy, egg whites Applesauce, gelatin, popsicles  Foods to Avoid:  Fatty foods (e.g., butter, cheese, fried foods) Spicy foods Acidic foods (e.g., citrus fruits, tomatoes) Alcohol Caffeine Raw fruits and vegetables  Complete ciprofloxacin 500mg  BID for 7 days   Advised to go to the ER if there is any severe abdominal pain, unable to hold down food/water, blood in stool or vomit, chest pain, shortness of breath, or any worsening symptoms.

## 2024-04-02 NOTE — Progress Notes (Signed)
 Noted. Resume care with oncology

## 2024-04-03 ENCOUNTER — Encounter: Payer: Self-pay | Admitting: Oncology

## 2024-04-03 ENCOUNTER — Telehealth: Payer: Self-pay | Admitting: Gastroenterology

## 2024-04-03 ENCOUNTER — Ambulatory Visit: Payer: Self-pay | Admitting: Internal Medicine

## 2024-04-03 ENCOUNTER — Other Ambulatory Visit: Payer: Self-pay

## 2024-04-03 ENCOUNTER — Other Ambulatory Visit: Payer: Self-pay | Admitting: Nurse Practitioner

## 2024-04-03 ENCOUNTER — Encounter: Payer: Self-pay | Admitting: *Deleted

## 2024-04-03 ENCOUNTER — Other Ambulatory Visit (HOSPITAL_BASED_OUTPATIENT_CLINIC_OR_DEPARTMENT_OTHER): Payer: Self-pay

## 2024-04-03 DIAGNOSIS — C259 Malignant neoplasm of pancreas, unspecified: Secondary | ICD-10-CM

## 2024-04-03 MED ORDER — HYDROMORPHONE HCL 2 MG PO TABS
1.0000 mg | ORAL_TABLET | Freq: Four times a day (QID) | ORAL | 0 refills | Status: DC | PRN
  Filled 2024-04-03: qty 30, 15d supply, fill #0

## 2024-04-03 NOTE — Progress Notes (Signed)
 Called and spoke with pt regarding a few questions. 1. During appt here at [facility] on 10/20 he will have labs drawn 2. Assistance with disability paperwork- he is going to call SW Lizbeth Sprague for guidance 3. If he develops any pain that is not responding to medicine or develops a fever then proceed to Emergency room for evaluation. Patient states that he has a litte discomfort while sleeping on back but otherwise feels fine. He is walking after meals which he states helps with digestion

## 2024-04-03 NOTE — Telephone Encounter (Signed)
 Inbound call from patient stating he would like to know if he has to continue the gas medication he was told to drink at yesterdays 04/02/24 appointment with [facility] May. Requesting a call back  Please advise  Thank you

## 2024-04-04 ENCOUNTER — Ambulatory Visit: Admitting: Gastroenterology

## 2024-04-07 ENCOUNTER — Inpatient Hospital Stay: Admitting: Nutrition

## 2024-04-07 ENCOUNTER — Encounter: Payer: Self-pay | Admitting: Nurse Practitioner

## 2024-04-07 ENCOUNTER — Inpatient Hospital Stay

## 2024-04-07 ENCOUNTER — Encounter: Payer: Self-pay | Admitting: Oncology

## 2024-04-07 ENCOUNTER — Other Ambulatory Visit (HOSPITAL_BASED_OUTPATIENT_CLINIC_OR_DEPARTMENT_OTHER): Payer: Self-pay

## 2024-04-07 ENCOUNTER — Inpatient Hospital Stay (HOSPITAL_BASED_OUTPATIENT_CLINIC_OR_DEPARTMENT_OTHER): Admitting: Nurse Practitioner

## 2024-04-07 DIAGNOSIS — K831 Obstruction of bile duct: Secondary | ICD-10-CM | POA: Diagnosis not present

## 2024-04-07 DIAGNOSIS — E039 Hypothyroidism, unspecified: Secondary | ICD-10-CM | POA: Diagnosis not present

## 2024-04-07 DIAGNOSIS — I10 Essential (primary) hypertension: Secondary | ICD-10-CM | POA: Diagnosis not present

## 2024-04-07 DIAGNOSIS — C259 Malignant neoplasm of pancreas, unspecified: Secondary | ICD-10-CM

## 2024-04-07 DIAGNOSIS — Z800 Family history of malignant neoplasm of digestive organs: Secondary | ICD-10-CM | POA: Diagnosis not present

## 2024-04-07 DIAGNOSIS — C787 Secondary malignant neoplasm of liver and intrahepatic bile duct: Secondary | ICD-10-CM | POA: Diagnosis not present

## 2024-04-07 DIAGNOSIS — H409 Unspecified glaucoma: Secondary | ICD-10-CM | POA: Diagnosis not present

## 2024-04-07 DIAGNOSIS — E890 Postprocedural hypothyroidism: Secondary | ICD-10-CM

## 2024-04-07 DIAGNOSIS — G893 Neoplasm related pain (acute) (chronic): Secondary | ICD-10-CM | POA: Diagnosis not present

## 2024-04-07 DIAGNOSIS — R978 Other abnormal tumor markers: Secondary | ICD-10-CM | POA: Diagnosis not present

## 2024-04-07 DIAGNOSIS — D509 Iron deficiency anemia, unspecified: Secondary | ICD-10-CM | POA: Diagnosis not present

## 2024-04-07 DIAGNOSIS — R748 Abnormal levels of other serum enzymes: Secondary | ICD-10-CM | POA: Diagnosis not present

## 2024-04-07 DIAGNOSIS — C250 Malignant neoplasm of head of pancreas: Secondary | ICD-10-CM | POA: Diagnosis not present

## 2024-04-07 DIAGNOSIS — K219 Gastro-esophageal reflux disease without esophagitis: Secondary | ICD-10-CM | POA: Diagnosis not present

## 2024-04-07 DIAGNOSIS — Z5111 Encounter for antineoplastic chemotherapy: Secondary | ICD-10-CM | POA: Diagnosis present

## 2024-04-07 LAB — CBC WITH DIFFERENTIAL (CANCER CENTER ONLY)
Abs Immature Granulocytes: 0.04 K/uL (ref 0.00–0.07)
Basophils Absolute: 0.1 K/uL (ref 0.0–0.1)
Basophils Relative: 1 %
Eosinophils Absolute: 0.1 K/uL (ref 0.0–0.5)
Eosinophils Relative: 1 %
HCT: 29.1 % — ABNORMAL LOW (ref 39.0–52.0)
Hemoglobin: 9.8 g/dL — ABNORMAL LOW (ref 13.0–17.0)
Immature Granulocytes: 1 %
Lymphocytes Relative: 11 %
Lymphs Abs: 0.9 K/uL (ref 0.7–4.0)
MCH: 20.1 pg — ABNORMAL LOW (ref 26.0–34.0)
MCHC: 33.7 g/dL (ref 30.0–36.0)
MCV: 59.8 fL — ABNORMAL LOW (ref 80.0–100.0)
Monocytes Absolute: 0.6 K/uL (ref 0.1–1.0)
Monocytes Relative: 7 %
Neutro Abs: 6.3 K/uL (ref 1.7–7.7)
Neutrophils Relative %: 79 %
Platelet Count: 292 K/uL (ref 150–400)
RBC: 4.87 MIL/uL (ref 4.22–5.81)
RDW: 16.9 % — ABNORMAL HIGH (ref 11.5–15.5)
WBC Count: 7.9 K/uL (ref 4.0–10.5)
nRBC: 0.3 % — ABNORMAL HIGH (ref 0.0–0.2)

## 2024-04-07 LAB — CMP (CANCER CENTER ONLY)
ALT: 126 U/L — ABNORMAL HIGH (ref 0–44)
AST: 71 U/L — ABNORMAL HIGH (ref 15–41)
Albumin: 3.8 g/dL (ref 3.5–5.0)
Alkaline Phosphatase: 256 U/L — ABNORMAL HIGH (ref 38–126)
Anion gap: 11 (ref 5–15)
BUN: 16 mg/dL (ref 6–20)
CO2: 24 mmol/L (ref 22–32)
Calcium: 9.6 mg/dL (ref 8.9–10.3)
Chloride: 100 mmol/L (ref 98–111)
Creatinine: 1.14 mg/dL (ref 0.61–1.24)
GFR, Estimated: >60 mL/min (ref >60)
Glucose, Bld: 239 mg/dL — ABNORMAL HIGH (ref 70–99)
Potassium: 4 mmol/L (ref 3.5–5.1)
Sodium: 134 mmol/L — ABNORMAL LOW (ref 135–145)
Total Bilirubin: 7.5 mg/dL (ref 0.0–1.2)
Total Protein: 7.6 g/dL (ref 6.5–8.1)

## 2024-04-07 MED ORDER — LEVOTHYROXINE SODIUM 125 MCG PO TABS
125.0000 ug | ORAL_TABLET | Freq: Every day | ORAL | 0 refills | Status: DC
  Filled 2024-04-07 (×2): qty 90, 90d supply, fill #0

## 2024-04-07 NOTE — Progress Notes (Signed)
  [facility] OFFICE PROGRESS NOTE   Diagnosis: Pancreas cancer  INTERVAL HISTORY:   Douglas Harper returns as scheduled.  No further fever.  No nausea or vomiting.  No diarrhea.  No abdominal pain.  He has a good appetite.  He thinks the jaundice has improved.  Objective:  Vital signs in last 24 hours:  Blood pressure 114/73, pulse 73, temperature 97.8 F (36.6 C), temperature source Temporal, resp. rate 18, height 6' 4 (1.93 m), weight 195 lb 8 oz (88.7 kg), SpO2 100%.    HEENT: Scleral icterus. Resp: Lungs clear bilaterally. Cardio: Regular rate and rhythm. GI: No hepatosplenomegaly.  Nontender. Vascular: No leg edema. Port-A-Cath without erythema.  Lab Results:  Lab Results  Component Value Date   WBC 9.4 04/02/2024   HGB 11.2 (L) 04/02/2024   HCT 35.4 (L) 04/02/2024   MCV 61.5 (L) 04/02/2024   PLT 280.0 04/02/2024   NEUTROABS 3.4 R 04/02/2024    Imaging:  No results found.  Medications: I have reviewed the patient's current medications.  Assessment/Plan: Pancreas cancer, stage IV (cT1c, cN0, pM1) 03/09/2024 CTs chest, abdomen, and pelvis: 1.6 x 1.9 cm pancreas head mass, upstream pancreatic duct dilation, multiple hypoattenuating liver lesions, fat stranding surrounding the pancreas head and proximal duodenum, no metastatic disease in the chest Ultrasound-guided biopsy of left liver mass 03/11/2024: Invasive moderately differentiated pancreatic adenocarcinoma, pancreas with severe ductal dysplasia (PanIN-3), no hepatic parenchyma is present.  MSS, HRD signature negative, tumor mutation burden 0, KRAS G 12R, Elevated CA 19-9 Elevated liver enzymes and bilirubin Progressive rise in liver enzymes and bilirubin 03/24/2024 CTs 03/24/2024-increase in mild to moderate biliary duct dilatation; pancreatic head/uncinate process mass and liver metastasis relatively similar 03/25/2024: Biliary stricture with upstream dilation, head of pancreas with double duct sign  uncovered metal stent 03/26/2024 ultrasound biopsy of right liver lesion: Metastatic moderately differentiated adenocarcinoma consistent with a pancreas primary 03/27/2024 CT abdomen/pelvis: Interval placement of a biliary stent extending into the cystic duct, not clearly communicating with the common duct occiput to the obstructing mass.,  Stable biliary duct dilation, stable hepatic metastases and pancreas head mass, development of mild peripancreatic inflammatory stranding Pain secondary to #1 Hypothyroidism Family history of colon cancer Microcytic anemia 03/24/2024 Hemoglobin electrophoresis: 94.6% A, 5.4% A2 Hyperthyroidism status post RAI in 2009 Gastroesophageal reflux disease Glaucoma Hypertension Hospital admission 03/24/2024 with obstructive jaundice, status post common duct stent placement Hospital admission 03/27/2024 with post ERCP pancreatitis, fever migration of bile duct stent to the cystic duct, increased abdominal pain; 03/28/2024 ERCP with metal stent removal followed by placement of self-expanding metal stent    Disposition: Douglas Harper appears stable.  We reviewed the chemistry panel.  Bilirubin is better, 7.5 today.  We discussed goal bilirubin of 2 prior to beginning treatment.  He will return for lab, follow-up, possible chemotherapy in 1 week.  He will contact the office in the interim with any problems.  Plan reviewed with Dr. Cloretta.    Olam Ned ANP/GNP-BC   04/07/2024  9:19 AM

## 2024-04-07 NOTE — Progress Notes (Signed)
 Patient seen by Olam Ned NP today  Vitals are within treatment parameters:Yes   Labs are within treatment parameters: No (Please specify and give further instructions.) total bilirubin 7.5  Treatment plan has been signed: Yes   Per physician team, Patient will not be receiving treatment today.

## 2024-04-07 NOTE — Progress Notes (Signed)
 Nutrition follow up completed with patient. Chemotherapy held today due to elevated bilirubin. Plan for modified FOLFIRINOX for Pancreas cancer. He is followed by Dr. Cloretta.  Weight decreased: 195 pounds 8 oz October 20 195 pounds 9.6 oz October 15 202 pounds 6.4 oz October 6  Labs include Na 134, Glucose 239, Total bilirubin 7.5  He feels better and denies nausea, vomiting and diarrhea. No abdominal pain. He continues to eat only eggs and mashed potatoes. He drinks liquids. He has been thinking about adding some soft foods to his diet.  Nutrition Diagnosis: Food and Nutrition Related Knowledge Deficit, continues  Intervention: Educated to continue current foods tolerated but consider adding some soft low fat baked chicken or fish. Reviewed some bland soft canned vegetables for increased variety.  Change ONS to Ensure High Protein or original ensure. Can try Ensure Plus or equivalent, ~ 4 oz, and assess tolerance.  Monitoring, Evaluation, Goals: Tolerate increased calories and protein to minimize weight loss.  Next Visit: Monday, November 3, during infusion.

## 2024-04-08 ENCOUNTER — Other Ambulatory Visit: Payer: Self-pay | Admitting: *Deleted

## 2024-04-08 DIAGNOSIS — C259 Malignant neoplasm of pancreas, unspecified: Secondary | ICD-10-CM

## 2024-04-09 ENCOUNTER — Inpatient Hospital Stay

## 2024-04-11 ENCOUNTER — Other Ambulatory Visit: Payer: Self-pay | Admitting: Oncology

## 2024-04-14 ENCOUNTER — Inpatient Hospital Stay (HOSPITAL_BASED_OUTPATIENT_CLINIC_OR_DEPARTMENT_OTHER): Admitting: Oncology

## 2024-04-14 ENCOUNTER — Inpatient Hospital Stay

## 2024-04-14 ENCOUNTER — Other Ambulatory Visit: Payer: Self-pay | Admitting: *Deleted

## 2024-04-14 ENCOUNTER — Telehealth: Payer: Self-pay | Admitting: *Deleted

## 2024-04-14 VITALS — BP 123/81 | HR 71 | Temp 97.8°F | Resp 18 | Ht 76.0 in | Wt 195.2 lb

## 2024-04-14 DIAGNOSIS — C259 Malignant neoplasm of pancreas, unspecified: Secondary | ICD-10-CM

## 2024-04-14 DIAGNOSIS — Z5111 Encounter for antineoplastic chemotherapy: Secondary | ICD-10-CM | POA: Diagnosis not present

## 2024-04-14 LAB — CMP (CANCER CENTER ONLY)
ALT: 102 U/L — ABNORMAL HIGH (ref 0–44)
AST: 57 U/L — ABNORMAL HIGH (ref 15–41)
Albumin: 4 g/dL (ref 3.5–5.0)
Alkaline Phosphatase: 241 U/L — ABNORMAL HIGH (ref 38–126)
Anion gap: 10 (ref 5–15)
BUN: 11 mg/dL (ref 6–20)
CO2: 26 mmol/L (ref 22–32)
Calcium: 9.8 mg/dL (ref 8.9–10.3)
Chloride: 100 mmol/L (ref 98–111)
Creatinine: 0.96 mg/dL (ref 0.61–1.24)
GFR, Estimated: >60 mL/min (ref >60)
Glucose, Bld: 259 mg/dL — ABNORMAL HIGH (ref 70–99)
Potassium: 4 mmol/L (ref 3.5–5.1)
Sodium: 136 mmol/L (ref 135–145)
Total Bilirubin: 4.1 mg/dL (ref 0.0–1.2)
Total Protein: 7.8 g/dL (ref 6.5–8.1)

## 2024-04-14 LAB — CBC WITH DIFFERENTIAL (CANCER CENTER ONLY)
Abs Immature Granulocytes: 0.02 K/uL (ref 0.00–0.07)
Basophils Absolute: 0.1 K/uL (ref 0.0–0.1)
Basophils Relative: 1 %
Eosinophils Absolute: 0.1 K/uL (ref 0.0–0.5)
Eosinophils Relative: 1 %
HCT: 29.8 % — ABNORMAL LOW (ref 39.0–52.0)
Hemoglobin: 9.7 g/dL — ABNORMAL LOW (ref 13.0–17.0)
Immature Granulocytes: 0 %
Lymphocytes Relative: 17 %
Lymphs Abs: 1 K/uL (ref 0.7–4.0)
MCH: 19.8 pg — ABNORMAL LOW (ref 26.0–34.0)
MCHC: 32.6 g/dL (ref 30.0–36.0)
MCV: 60.7 fL — ABNORMAL LOW (ref 80.0–100.0)
Monocytes Absolute: 0.4 K/uL (ref 0.1–1.0)
Monocytes Relative: 7 %
Neutro Abs: 4.2 K/uL (ref 1.7–7.7)
Neutrophils Relative %: 74 %
Platelet Count: 282 K/uL (ref 150–400)
RBC: 4.91 MIL/uL (ref 4.22–5.81)
RDW: 16.3 % — ABNORMAL HIGH (ref 11.5–15.5)
WBC Count: 5.7 K/uL (ref 4.0–10.5)
nRBC: 0 % (ref 0.0–0.2)

## 2024-04-14 NOTE — Telephone Encounter (Signed)
 Notified Douglas Harper of CBC/CMP results. Per Dr. Cloretta: will not be able to have irinotecan tomorrow, will just have FOLFOX and may slightly reduce 5FU dose. Informed him of elevated glucose and need to try to avoid concentrated sweets.  Patient seen by Dr. Arley Cloretta today  Vitals are within treatment parameters:Yes   Labs are within treatment parameters: No (Please specify and give further instructions.) Total bili, AST, ALT elevated--will hold irinotecan tomorrow  Treatment plan has been signed: Yes   Per physician team, Patient is ready for treatment. Please note the following modifications: Hold irinotecan and slight dose reduction of 5FU on 04/15/24

## 2024-04-14 NOTE — Progress Notes (Signed)
 [name] OFFICE PROGRESS NOTE   Diagnosis: Pancreas cancer  INTERVAL HISTORY:   [name] returns as scheduled.  He feels well.  No nausea.  The urine is not as dark.  Abdominal pain has improved.  He reports a good appetite.  Objective:  Vital signs in last 24 hours:  Blood pressure 123/81, pulse 71, temperature 97.8 F (36.6 C), temperature source Temporal, resp. rate 18, height 6' 4 (1.93 m), weight 195 lb 3.2 oz (88.5 kg), SpO2 100%.    HEENT: Scleral icterus, no thrush Resp: Lungs clear bilaterally Cardio: Regular rate and rhythm GI: No hepatosplenomegaly, nontender, no mass Vascular: No leg edema  Skin: Jaundice  Portacath/PICC-without erythema  Lab Results:  Lab Results  Component Value Date   WBC 7.9 04/07/2024   HGB 9.8 (L) 04/07/2024   HCT 29.1 (L) 04/07/2024   MCV 59.8 (L) 04/07/2024   PLT 292 04/07/2024   NEUTROABS 6.3 04/07/2024    CMP  Lab Results  Component Value Date   NA 134 (L) 04/07/2024   K 4.0 04/07/2024   CL 100 04/07/2024   CO2 24 04/07/2024   GLUCOSE 239 (H) 04/07/2024   BUN 16 04/07/2024   CREATININE 1.14 04/07/2024   CALCIUM 9.6 04/07/2024   PROT 7.6 04/07/2024   ALBUMIN 3.8 04/07/2024   AST 71 (H) 04/07/2024   ALT 126 (H) 04/07/2024   ALKPHOS 256 (H) 04/07/2024   BILITOT 7.5 (HH) 04/07/2024   GFRNONAA >60 04/07/2024   GFRAA 83 04/11/2019    Lab Results  Component Value Date   CEA1 43.8 (H) 03/10/2024   RJW800 20,185 (H) 03/10/2024    Lab Results  Component Value Date   INR 1.0 03/24/2024   LABPROT 13.9 03/24/2024    Imaging:  No results found.  Medications: I have reviewed the patient's current medications.   Assessment/Plan: Pancreas cancer, stage IV (cT1c, cN0, pM1) 03/09/2024 CTs chest, abdomen, and pelvis: 1.6 x 1.9 cm pancreas head mass, upstream pancreatic duct dilation, multiple hypoattenuating liver lesions, fat stranding surrounding the pancreas head and proximal duodenum, no  metastatic disease in the chest Ultrasound-guided biopsy of left liver mass 03/11/2024: Invasive moderately differentiated pancreatic adenocarcinoma, pancreas with severe ductal dysplasia (PanIN-3), no hepatic parenchyma is present.  MSS, HRD signature negative, tumor mutation burden 0, KRAS G 12R, Elevated CA 19-9 Elevated liver enzymes and bilirubin Progressive rise in liver enzymes and bilirubin 03/24/2024 CTs 03/24/2024-increase in mild to moderate biliary duct dilatation; pancreatic head/uncinate process mass and liver metastasis relatively similar 03/25/2024: Biliary stricture with upstream dilation, head of pancreas with double duct sign uncovered metal stent 03/26/2024 ultrasound biopsy of right liver lesion: Metastatic moderately differentiated adenocarcinoma consistent with a pancreas primary 03/27/2024 CT abdomen/pelvis: Interval placement of a biliary stent extending into the cystic duct, not clearly communicating with the common duct occiput to the obstructing mass.,  Stable biliary duct dilation, stable hepatic metastases and pancreas head mass, development of mild peripancreatic inflammatory stranding Pain secondary to #1 Hypothyroidism Family history of colon cancer Microcytic anemia 03/24/2024 Hemoglobin electrophoresis: 94.6% A, 5.4% A2 Hyperthyroidism status post RAI in 2009 Gastroesophageal reflux disease Glaucoma Hypertension Hospital admission 03/24/2024 with obstructive jaundice, status post common duct stent placement Hospital admission 03/27/2024 with post ERCP pancreatitis, fever migration of bile duct stent to the cystic duct, increased abdominal pain; 03/28/2024 ERCP with metal stent removal followed by placement of self-expanding metal stent      Disposition: Douglas Harper has metastatic pancreas cancer.  He underwent placement  of a biliary stent 03/28/2024.  The bilirubin remained markedly elevated when he was here last week.  We will follow-up on the chemistry panel from  today.  The plan is to proceed with FOLFIRINOX if the bilirubin returns at less than 2.  If not, he will complete a cycle of FOLFOX.  Mr. Chain is scheduled for an office visit and nutrition appointment in 1 week.  Arley Hof, MD  04/14/2024  11:12 AM

## 2024-04-15 ENCOUNTER — Inpatient Hospital Stay

## 2024-04-15 ENCOUNTER — Other Ambulatory Visit: Payer: Self-pay | Admitting: Oncology

## 2024-04-15 VITALS — BP 132/82 | HR 74 | Temp 98.2°F | Resp 16

## 2024-04-15 DIAGNOSIS — C259 Malignant neoplasm of pancreas, unspecified: Secondary | ICD-10-CM

## 2024-04-15 DIAGNOSIS — Z5111 Encounter for antineoplastic chemotherapy: Secondary | ICD-10-CM | POA: Diagnosis not present

## 2024-04-15 MED ORDER — PALONOSETRON HCL INJECTION 0.25 MG/5ML
0.2500 mg | Freq: Once | INTRAVENOUS | Status: AC
  Administered 2024-04-15: 0.25 mg via INTRAVENOUS
  Filled 2024-04-15: qty 5

## 2024-04-15 MED ORDER — DEXTROSE 5 % IV SOLN: INTRAVENOUS | Status: DC

## 2024-04-15 MED ORDER — SODIUM CHLORIDE 0.9 % IV SOLN
1800.0000 mg/m2 | INTRAVENOUS | Status: DC
  Administered 2024-04-15: 3900 mg via INTRAVENOUS
  Filled 2024-04-15: qty 78

## 2024-04-15 MED ORDER — LEUCOVORIN CALCIUM INJECTION 350 MG
200.0000 mg/m2 | Freq: Once | INTRAVENOUS | Status: AC
  Administered 2024-04-15: 436 mg via INTRAVENOUS
  Filled 2024-04-15: qty 21.8

## 2024-04-15 MED ORDER — OXALIPLATIN CHEMO INJECTION 100 MG/20ML
85.0000 mg/m2 | Freq: Once | INTRAVENOUS | Status: AC
  Administered 2024-04-15: 200 mg via INTRAVENOUS
  Filled 2024-04-15: qty 40

## 2024-04-15 MED ORDER — DEXAMETHASONE SOD PHOSPHATE PF 10 MG/ML IJ SOLN
10.0000 mg | Freq: Once | INTRAMUSCULAR | Status: AC
  Administered 2024-04-15: 10 mg via INTRAVENOUS

## 2024-04-15 NOTE — Patient Instructions (Signed)
 [facility] - A DEPT OF [facility]  Discharge Instructions: Thank you for choosing [facility] to provide your oncology and hematology care.   If you have a lab appointment with the Cancer Center, please go directly to the Cancer Center and check in at the registration area.   Wear comfortable clothing and clothing appropriate for easy access to any Portacath or PICC line.   We strive to give you quality time with your provider. You may need to reschedule your appointment if you arrive late (15 or more minutes).  Arriving late affects you and other patients whose appointments are after yours.  Also, if you miss three or more appointments without notifying the office, you may be dismissed from the clinic at the provider's discretion.      For prescription refill requests, have your pharmacy contact our office and allow 72 hours for refills to be completed.    Today you received the following chemotherapy and/or immunotherapy agents: oxaliplatin, leucovirin, fluorouracil.      To help prevent nausea and vomiting after your treatment, we encourage you to take your nausea medication as directed.  BELOW ARE SYMPTOMS THAT SHOULD BE REPORTED IMMEDIATELY: *FEVER GREATER THAN 100.4 F (38 C) OR HIGHER *CHILLS OR SWEATING *NAUSEA AND VOMITING THAT IS NOT CONTROLLED WITH YOUR NAUSEA MEDICATION *UNUSUAL SHORTNESS OF BREATH *UNUSUAL BRUISING OR BLEEDING *URINARY PROBLEMS (pain or burning when urinating, or frequent urination) *BOWEL PROBLEMS (unusual diarrhea, constipation, pain near the anus) TENDERNESS IN MOUTH AND THROAT WITH OR WITHOUT PRESENCE OF ULCERS (sore throat, sores in mouth, or a toothache) UNUSUAL RASH, SWELLING OR PAIN  UNUSUAL VAGINAL DISCHARGE OR ITCHING   Items with * indicate a potential emergency and should be followed up as soon as possible or go to the Emergency Department if any problems should occur.  Please show the CHEMOTHERAPY  ALERT CARD or IMMUNOTHERAPY ALERT CARD at check-in to the Emergency Department and triage nurse.  Should you have questions after your visit or need to cancel or reschedule your appointment, please contact [facility] - A DEPT OF [facility]  Dept: [phone]  and follow the prompts.  Office hours are 8:00 a.m. to 4:30 p.m. Monday - Friday. Please note that voicemails left after 4:00 p.m. may not be returned until the following business day.  We are closed weekends and major holidays. You have access to a nurse at all times for urgent questions. Please call the main number to the clinic Dept: [phone] and follow the prompts.   For any non-urgent questions, you may also contact your provider using MyChart. We now offer e-Visits for anyone 39 and older to request care online for non-urgent symptoms. For details visit [email or web address].   Also download the MyChart app! Go to the app store, search MyChart, open the app, select [facility], and log in with your MyChart username and password. ___________________________________________ Oxaliplatin Injection What is this medication? OXALIPLATIN (ox AL i PLA tin) treats colorectal cancer. It works by slowing down the growth of cancer cells. This medicine may be used for other purposes; ask your health care provider or pharmacist if you have questions. COMMON BRAND NAME(S): Eloxatin What should I tell my care team before I take this medication? They need to know if you have any of these conditions: Heart disease History of irregular heartbeat or rhythm Liver disease Low blood cell levels (white cells, red cells, and platelets) Lung or breathing  disease, such as asthma Take medications that treat or prevent blood clots Tingling of the fingers, toes, or other nerve disorder An unusual or allergic reaction to oxaliplatin, other medications, foods, dyes, or preservatives If you or your partner are pregnant or  trying to get pregnant Breast-feeding How should I use this medication? This medication is injected into a vein. It is given by your care team in a hospital or clinic setting. Talk to your care team about the use of this medication in children. Special care may be needed. Overdosage: If you think you have taken too much of this medicine contact a poison control center or emergency room at once. NOTE: This medicine is only for you. Do not share this medicine with others. What if I miss a dose? Keep appointments for follow-up doses. It is important not to miss a dose. Call your care team if you are unable to keep an appointment. What may interact with this medication? Do not take this medication with any of the following: Cisapride Dronedarone Pimozide Thioridazine This medication may also interact with the following: Aspirin and aspirin-like medications Certain medications that treat or prevent blood clots, such as warfarin, apixaban, dabigatran, and rivaroxaban Cisplatin Cyclosporine Diuretics Medications for infection, such as acyclovir, adefovir, amphotericin B, bacitracin, cidofovir, foscarnet, ganciclovir, gentamicin, pentamidine, vancomycin NSAIDs, medications for pain and inflammation, such as ibuprofen  or naproxen Other medications that cause heart rhythm changes Pamidronate Zoledronic acid This list may not describe all possible interactions. Give your health care provider a list of all the medicines, herbs, non-prescription drugs, or dietary supplements you use. Also tell them if you smoke, drink alcohol, or use illegal drugs. Some items may interact with your medicine. What should I watch for while using this medication? Your condition will be monitored carefully while you are receiving this medication. You may need blood work while taking this medication. This medication may make you feel generally unwell. This is not uncommon as chemotherapy can affect healthy cells as well  as cancer cells. Report any side effects. Continue your course of treatment even though you feel ill unless your care team tells you to stop. This medication may increase your risk of getting an infection. Call your care team for advice if you get a fever, chills, sore throat, or other symptoms of a cold or flu. Do not treat yourself. Try to avoid being around people who are sick. Avoid taking medications that contain aspirin, acetaminophen, ibuprofen , naproxen, or ketoprofen unless instructed by your care team. These medications may hide a fever. Be careful brushing or flossing your teeth or using a toothpick because you may get an infection or bleed more easily. If you have any dental work done, tell your dentist you are receiving this medication. This medication can make you more sensitive to cold. Do not drink cold drinks or use ice. Cover exposed skin before coming in contact with cold temperatures or cold objects. When out in cold weather wear warm clothing and cover your mouth and nose to warm the air that goes into your lungs. Tell your care team if you get sensitive to the cold. Talk to your care team if you or your partner are pregnant or think either of you might be pregnant. This medication can cause serious birth defects if taken during pregnancy and for 9 months after the last dose. A negative pregnancy test is required before starting this medication. A reliable form of contraception is recommended while taking this medication and for 9 months  after the last dose. Talk to your care team about effective forms of contraception. Do not father a child while taking this medication and for 6 months after the last dose. Use a condom while having sex during this time period. Do not breastfeed while taking this medication and for 3 months after the last dose. This medication may cause infertility. Talk to your care team if you are concerned about your fertility. What side effects may I notice from  receiving this medication? Side effects that you should report to your care team as soon as possible: Allergic reactions--skin rash, itching, hives, swelling of the face, lips, tongue, or throat Bleeding--bloody or black, tar-like stools, vomiting blood or brown material that looks like coffee grounds, red or dark brown urine, small red or purple spots on skin, unusual bruising or bleeding Dry cough, shortness of breath or trouble breathing Heart rhythm changes--fast or irregular heartbeat, dizziness, feeling faint or lightheaded, chest pain, trouble breathing Infection--fever, chills, cough, sore throat, wounds that don't heal, pain or trouble when passing urine, general feeling of discomfort or being unwell Liver injury--right upper belly pain, loss of appetite, nausea, light-colored stool, dark yellow or brown urine, yellowing skin or eyes, unusual weakness or fatigue Low red blood cell level--unusual weakness or fatigue, dizziness, headache, trouble breathing Muscle injury--unusual weakness or fatigue, muscle pain, dark yellow or brown urine, decrease in amount of urine Pain, tingling, or numbness in the hands or feet Sudden and severe headache, confusion, change in vision, seizures, which may be signs of posterior reversible encephalopathy syndrome (PRES) Unusual bruising or bleeding Side effects that usually do not require medical attention (report to your care team if they continue or are bothersome): Diarrhea Nausea Pain, redness, or swelling with sores inside the mouth or throat Unusual weakness or fatigue Vomiting This list may not describe all possible side effects. Call your doctor for medical advice about side effects. You may report side effects to FDA at 1-800-FDA-1088. Where should I keep my medication? This medication is given in a hospital or clinic. It will not be stored at home. NOTE: This sheet is a summary. It may not cover all possible information. If you have questions  about this medicine, talk to your doctor, pharmacist, or health care provider.  2024 Elsevier/Gold Standard (2023-05-18 00:00:00) _________________________________ Leucovorin Injection What is this medication? LEUCOVORIN (loo koe VOR in) prevents side effects from certain medications, such as methotrexate. It works by increasing folate levels. This helps protect healthy cells in your body. It may also be used to treat anemia caused by low levels of folate. It can also be used with fluorouracil, a type of chemotherapy, to treat colorectal cancer. It works by increasing the effects of fluorouracil in the body. This medicine may be used for other purposes; ask your health care provider or pharmacist if you have questions. What should I tell my care team before I take this medication? They need to know if you have any of these conditions: Anemia from low levels of vitamin B12 in the blood An unusual or allergic reaction to leucovorin, folic acid, other medications, foods, dyes, or preservatives Pregnant or trying to get pregnant Breastfeeding How should I use this medication? This medication is injected into a vein or a muscle. It is given by your care team in a hospital or clinic setting. Talk to your care team about the use of this medication in children. Special care may be needed. Overdosage: If you think you have taken too much  of this medicine contact a poison control center or emergency room at once. NOTE: This medicine is only for you. Do not share this medicine with others. What if I miss a dose? Keep appointments for follow-up doses. It is important not to miss your dose. Call your care team if you are unable to keep an appointment. What may interact with this medication? Capecitabine Fluorouracil Phenobarbital Phenytoin Primidone Trimethoprim;sulfamethoxazole This list may not describe all possible interactions. Give your health care provider a list of all the medicines, herbs,  non-prescription drugs, or dietary supplements you use. Also tell them if you smoke, drink alcohol, or use illegal drugs. Some items may interact with your medicine. What should I watch for while using this medication? Your condition will be monitored carefully while you are receiving this medication. This medication may increase the side effects of 5-fluorouracil. Tell your care team if you have diarrhea or mouth sores that do not get better or that get worse. What side effects may I notice from receiving this medication? Side effects that you should report to your care team as soon as possible: Allergic reactions--skin rash, itching, hives, swelling of the face, lips, tongue, or throat This list may not describe all possible side effects. Call your doctor for medical advice about side effects. You may report side effects to FDA at 1-800-FDA-1088. Where should I keep my medication? This medication is given in a hospital or clinic. It will not be stored at home. NOTE: This sheet is a summary. It may not cover all possible information. If you have questions about this medicine, talk to your doctor, pharmacist, or health care provider.  2024 Elsevier/Gold Standard (2021-11-08 00:00:00) ________________________________ Fluorouracil Injection What is this medication? FLUOROURACIL (flure oh YOOR a sil) treats some types of cancer. It works by slowing down the growth of cancer cells. This medicine may be used for other purposes; ask your health care provider or pharmacist if you have questions. COMMON BRAND NAME(S): Adrucil What should I tell my care team before I take this medication? They need to know if you have any of these conditions: Blood disorders Dihydropyrimidine dehydrogenase (DPD) deficiency Infection, such as chickenpox, cold sores, herpes Kidney disease Liver disease Poor nutrition Recent or ongoing radiation therapy An unusual or allergic reaction to fluorouracil, other  medications, foods, dyes, or preservatives If you or your partner are pregnant or trying to get pregnant Breast-feeding How should I use this medication? This medication is injected into a vein. It is administered by your care team in a hospital or clinic setting. Talk to your care team about the use of this medication in children. Special care may be needed. Overdosage: If you think you have taken too much of this medicine contact a poison control center or emergency room at once. NOTE: This medicine is only for you. Do not share this medicine with others. What if I miss a dose? Keep appointments for follow-up doses. It is important not to miss your dose. Call your care team if you are unable to keep an appointment. What may interact with this medication? Do not take this medication with any of the following: Live virus vaccines This medication may also interact with the following: Medications that treat or prevent blood clots, such as warfarin, enoxaparin, dalteparin This list may not describe all possible interactions. Give your health care provider a list of all the medicines, herbs, non-prescription drugs, or dietary supplements you use. Also tell them if you smoke, drink alcohol, or use  illegal drugs. Some items may interact with your medicine. What should I watch for while using this medication? Your condition will be monitored carefully while you are receiving this medication. This medication may make you feel generally unwell. This is not uncommon as chemotherapy can affect healthy cells as well as cancer cells. Report any side effects. Continue your course of treatment even though you feel ill unless your care team tells you to stop. In some cases, you may be given additional medications to help with side effects. Follow all directions for their use. This medication may increase your risk of getting an infection. Call your care team for advice if you get a fever, chills, sore throat, or  other symptoms of a cold or flu. Do not treat yourself. Try to avoid being around people who are sick. This medication may increase your risk to bruise or bleed. Call your care team if you notice any unusual bleeding. Be careful brushing or flossing your teeth or using a toothpick because you may get an infection or bleed more easily. If you have any dental work done, tell your dentist you are receiving this medication. Avoid taking medications that contain aspirin, acetaminophen, ibuprofen , naproxen, or ketoprofen unless instructed by your care team. These medications may hide a fever. Do not treat diarrhea with over the counter products. Contact your care team if you have diarrhea that lasts more than 2 days or if it is severe and watery. This medication can make you more sensitive to the sun. Keep out of the sun. If you cannot avoid being in the sun, wear protective clothing and sunscreen. Do not use sun lamps, tanning beds, or tanning booths. Talk to your care team if you or your partner wish to become pregnant or think you might be pregnant. This medication can cause serious birth defects if taken during pregnancy and for 3 months after the last dose. A reliable form of contraception is recommended while taking this medication and for 3 months after the last dose. Talk to your care team about effective forms of contraception. Do not father a child while taking this medication and for 3 months after the last dose. Use a condom while having sex during this time period. Do not breastfeed while taking this medication. This medication may cause infertility. Talk to your care team if you are concerned about your fertility. What side effects may I notice from receiving this medication? Side effects that you should report to your care team as soon as possible: Allergic reactions--skin rash, itching, hives, swelling of the face, lips, tongue, or throat Heart attack--pain or tightness in the chest, shoulders,  arms, or jaw, nausea, shortness of breath, cold or clammy skin, feeling faint or lightheaded Heart failure--shortness of breath, swelling of the ankles, feet, or hands, sudden weight gain, unusual weakness or fatigue Heart rhythm changes--fast or irregular heartbeat, dizziness, feeling faint or lightheaded, chest pain, trouble breathing High ammonia level--unusual weakness or fatigue, confusion, loss of appetite, nausea, vomiting, seizures Infection--fever, chills, cough, sore throat, wounds that don't heal, pain or trouble when passing urine, general feeling of discomfort or being unwell Low red blood cell level--unusual weakness or fatigue, dizziness, headache, trouble breathing Pain, tingling, or numbness in the hands or feet, muscle weakness, change in vision, confusion or trouble speaking, loss of balance or coordination, trouble walking, seizures Redness, swelling, and blistering of the skin over hands and feet Severe or prolonged diarrhea Unusual bruising or bleeding Side effects that usually do not require  medical attention (report to your care team if they continue or are bothersome): Dry skin Headache Increased tears Nausea Pain, redness, or swelling with sores inside the mouth or throat Sensitivity to light Vomiting This list may not describe all possible side effects. Call your doctor for medical advice about side effects. You may report side effects to FDA at 1-800-FDA-1088. Where should I keep my medication? This medication is given in a hospital or clinic. It will not be stored at home. NOTE: This sheet is a summary. It may not cover all possible information. If you have questions about this medicine, talk to your doctor, pharmacist, or health care provider.  2024 Elsevier/Gold Standard (2021-10-11 00:00:00)

## 2024-04-17 ENCOUNTER — Encounter: Payer: Self-pay | Admitting: Oncology

## 2024-04-17 ENCOUNTER — Telehealth: Payer: Self-pay

## 2024-04-17 ENCOUNTER — Other Ambulatory Visit (HOSPITAL_BASED_OUTPATIENT_CLINIC_OR_DEPARTMENT_OTHER): Payer: Self-pay

## 2024-04-17 ENCOUNTER — Inpatient Hospital Stay

## 2024-04-17 ENCOUNTER — Other Ambulatory Visit: Payer: Self-pay | Admitting: *Deleted

## 2024-04-17 NOTE — Telephone Encounter (Signed)
 Liver test continue to improve. Ongoing monitoring per oncology. I will forward this note to oncology.

## 2024-04-17 NOTE — Progress Notes (Signed)
 Pt c/o feeling full in the stomach area of his abdomen and has not had a bowel movement since 04/14/24. Has not tried anything at home. Secure chat with Devere BROCKS, RN for Dr. Cloretta. Orders received for OTC Miralax, Colace and Maalox (see AVS).

## 2024-04-17 NOTE — Telephone Encounter (Signed)
/  Dr. Abran pt had cmet drawn at cancer center on 04/14/24 results in epic.

## 2024-04-17 NOTE — Telephone Encounter (Signed)
-----   Message from Nurse Rock DEL sent at 04/03/2024  4:55 PM EDT ----- Regarding: Labs Pt needs repeat LFT's drawn, order in epic.

## 2024-04-17 NOTE — Patient Instructions (Addendum)
 Take Miralax twice a day and add Colace 1-2 tabs/day. Push fluids and call tomorrow if not better. Can also try some mylanta/Maalox for the esophageal sensation. Can back off the Miralax once you start having bowel movements.    Implanted [facility] Guide An implanted port is a device that is placed under the skin. It is usually placed in the chest. The device may vary based on the need. Implanted ports can be used to give IV medicine, to take blood, or to give fluids. You may have an implanted port if: You need IV medicine that would be irritating to the small veins in your hands or arms. You need IV medicines, such as chemotherapy, for a long period of time. You need IV nutrition for a long period of time. You may have fewer limitations when using a port than you would if you used other types of long-term IVs. You will also likely be able to return to normal activities after your incision heals. An implanted port has two main parts: Reservoir. The reservoir is the part where a needle is inserted to give medicines or draw blood. The reservoir is round. After the port is placed, it appears as a small, raised area under your skin. Catheter. The catheter is a small, thin tube that connects the reservoir to a vein. Medicine that is inserted into the reservoir goes into the catheter and then into the vein. How is my port accessed? To access your port: A numbing cream may be placed on the skin over the port site. Your health care provider will put on a mask and sterile gloves. The skin over your port will be cleaned carefully with a germ-killing soap and allowed to dry. Your health care provider will gently pinch the port and insert a needle into it. Your health care provider will check for a blood return to make sure the port is in the vein and is still working (patent). If your port needs to remain accessed to get medicine continuously (constant infusion), your health care provider will place a clear  bandage (dressing) over the needle site. The dressing and needle will need to be changed every week, or as told by your health care provider. What is flushing? Flushing helps keep the port working. Follow instructions from your health care provider about how and when to flush the port. Ports are usually flushed with saline solution or a medicine called heparin. The need for flushing will depend on how the port is used: If the port is only used from time to time to give medicines or draw blood, the port may need to be flushed: Before and after medicines have been given. Before and after blood has been drawn. As part of routine maintenance. Flushing may be recommended every 4-6 weeks. If a constant infusion is running, the port may not need to be flushed. Throw away any syringes in a disposal container that is meant for sharp items (sharps container). You can buy a sharps container from a pharmacy, or you can make one by using an empty hard plastic bottle with a cover. How long will my port stay implanted? The port can stay in for as long as your health care provider thinks it is needed. When it is time for the port to come out, a surgery will be done to remove it. The surgery will be similar to the procedure that was done to put the port in. Follow these instructions at home: Caring for your port and  port site Flush your port as told by your health care provider. If you need an infusion over several days, follow instructions from your health care provider about how to take care of your port site. Make sure you: Change your dressing as told by your health care provider. Wash your hands with soap and water for at least 20 seconds before and after you change your dressing. If soap and water are not available, use alcohol-based hand sanitizer. Place any used dressings or infusion bags into a plastic bag. Throw that bag in the trash. Keep the dressing that covers the needle clean and dry. Do not get it  wet. Do not use scissors or sharp objects near the infusion tubing. Keep any external tubes clamped, unless they are being used. Check your port site every day for signs of infection. Check for: Redness, swelling, or pain. Fluid or blood. Warmth. Pus or a bad smell. Protect the skin around the port site. Avoid wearing bra straps that rub or irritate the site. Protect the skin around your port from seat belts. Place a soft pad over your chest if needed. Bathe or shower as told by your health care provider. The site may get wet as long as you are not actively receiving an infusion. General instructions  Return to your normal activities as told by your health care provider. Ask your health care provider what activities are safe for you. Carry a medical alert card or wear a medical alert bracelet at all times. This will let health care providers know that you have an implanted port in case of an emergency. Where to find more information American Cancer Society: www.cancer.org American Society of Clinical Oncology: www.cancer.net Contact a health care provider if: You have a fever or chills. You have redness, swelling, or pain at the port site. You have fluid or blood coming from your port site. Your incision feels warm to the touch. You have pus or a bad smell coming from the port site. Summary Implanted ports are usually placed in the chest for long-term IV access. Follow instructions from your health care provider about flushing the port and changing bandages (dressings). Take care of the area around your port by avoiding clothing that puts pressure on the area, and by watching for signs of infection. Protect the skin around your port from seat belts. Place a soft pad over your chest if needed. Contact a health care provider if you have a fever or you have redness, swelling, pain, fluid, or a bad smell at the port site. This information is not intended to replace advice given to you by  your health care provider. Make sure you discuss any questions you have with your health care provider. Document Revised: 12/07/2020 Document Reviewed: 12/07/2020 Elsevier Patient Education  2024 [employer].

## 2024-04-21 ENCOUNTER — Inpatient Hospital Stay: Attending: Oncology

## 2024-04-21 ENCOUNTER — Inpatient Hospital Stay: Admitting: Nurse Practitioner

## 2024-04-21 ENCOUNTER — Inpatient Hospital Stay

## 2024-04-21 ENCOUNTER — Encounter: Payer: Self-pay | Admitting: Nurse Practitioner

## 2024-04-21 ENCOUNTER — Inpatient Hospital Stay: Admitting: Nutrition

## 2024-04-21 ENCOUNTER — Ambulatory Visit

## 2024-04-21 ENCOUNTER — Other Ambulatory Visit: Payer: Self-pay | Admitting: Oncology

## 2024-04-21 VITALS — BP 106/82 | HR 77 | Temp 98.1°F | Resp 18 | Ht 76.0 in | Wt 192.2 lb

## 2024-04-21 DIAGNOSIS — C787 Secondary malignant neoplasm of liver and intrahepatic bile duct: Secondary | ICD-10-CM | POA: Diagnosis not present

## 2024-04-21 DIAGNOSIS — E039 Hypothyroidism, unspecified: Secondary | ICD-10-CM | POA: Insufficient documentation

## 2024-04-21 DIAGNOSIS — D509 Iron deficiency anemia, unspecified: Secondary | ICD-10-CM | POA: Diagnosis not present

## 2024-04-21 DIAGNOSIS — I10 Essential (primary) hypertension: Secondary | ICD-10-CM | POA: Diagnosis not present

## 2024-04-21 DIAGNOSIS — Z5111 Encounter for antineoplastic chemotherapy: Secondary | ICD-10-CM | POA: Diagnosis present

## 2024-04-21 DIAGNOSIS — Z800 Family history of malignant neoplasm of digestive organs: Secondary | ICD-10-CM | POA: Diagnosis not present

## 2024-04-21 DIAGNOSIS — G893 Neoplasm related pain (acute) (chronic): Secondary | ICD-10-CM | POA: Diagnosis not present

## 2024-04-21 DIAGNOSIS — K219 Gastro-esophageal reflux disease without esophagitis: Secondary | ICD-10-CM | POA: Insufficient documentation

## 2024-04-21 DIAGNOSIS — E0590 Thyrotoxicosis, unspecified without thyrotoxic crisis or storm: Secondary | ICD-10-CM | POA: Diagnosis not present

## 2024-04-21 DIAGNOSIS — R748 Abnormal levels of other serum enzymes: Secondary | ICD-10-CM | POA: Insufficient documentation

## 2024-04-21 DIAGNOSIS — C259 Malignant neoplasm of pancreas, unspecified: Secondary | ICD-10-CM | POA: Insufficient documentation

## 2024-04-21 LAB — CMP (CANCER CENTER ONLY)
ALT: 68 U/L — ABNORMAL HIGH (ref 0–44)
AST: 41 U/L (ref 15–41)
Albumin: 4.1 g/dL (ref 3.5–5.0)
Alkaline Phosphatase: 230 U/L — ABNORMAL HIGH (ref 38–126)
Anion gap: 9 (ref 5–15)
BUN: 15 mg/dL (ref 6–20)
CO2: 27 mmol/L (ref 22–32)
Calcium: 9.7 mg/dL (ref 8.9–10.3)
Chloride: 102 mmol/L (ref 98–111)
Creatinine: 1.04 mg/dL (ref 0.61–1.24)
GFR, Estimated: >60 mL/min (ref >60)
Glucose, Bld: 126 mg/dL — ABNORMAL HIGH (ref 70–99)
Potassium: 3.6 mmol/L (ref 3.5–5.1)
Sodium: 138 mmol/L (ref 135–145)
Total Bilirubin: 2.9 mg/dL — ABNORMAL HIGH (ref 0.0–1.2)
Total Protein: 7.6 g/dL (ref 6.5–8.1)

## 2024-04-21 NOTE — Progress Notes (Signed)
  [facility] OFFICE PROGRESS NOTE   Diagnosis: Pancreas cancer  INTERVAL HISTORY:   Douglas Harper returns as scheduled.  He completed a cycle of FOLFOX 04/15/2024.  He denies nausea/vomiting.  No mouth sores.  No diarrhea.  Cold sensitivity lasted about 4 days.  No pain at present.  He has a good appetite.  Objective:  Vital signs in last 24 hours:  Blood pressure 106/82, pulse 77, temperature 98.1 F (36.7 C), temperature source Temporal, resp. rate 18, height 6' 4 (1.93 m), weight 192 lb 3.2 oz (87.2 kg), SpO2 100%.    HEENT: Mild scleral icterus. Resp: Lungs clear bilaterally. Cardio: Regular rate and rhythm. GI: No hepatosplenomegaly.  Nontender. Vascular: No leg edema. Port-A-Cath without erythema.  Lab Results:  Lab Results  Component Value Date   WBC 5.7 04/14/2024   HGB 9.7 (L) 04/14/2024   HCT 29.8 (L) 04/14/2024   MCV 60.7 (L) 04/14/2024   PLT 282 04/14/2024   NEUTROABS 4.2 04/14/2024    Imaging:  No results found.  Medications: I have reviewed the patient's current medications.  Assessment/Plan: Pancreas cancer, stage IV (cT1c, cN0, pM1) 03/09/2024 CTs chest, abdomen, and pelvis: 1.6 x 1.9 cm pancreas head mass, upstream pancreatic duct dilation, multiple hypoattenuating liver lesions, fat stranding surrounding the pancreas head and proximal duodenum, no metastatic disease in the chest Ultrasound-guided biopsy of left liver mass 03/11/2024: Invasive moderately differentiated pancreatic adenocarcinoma, pancreas with severe ductal dysplasia (PanIN-3), no hepatic parenchyma is present.  MSS, HRD signature negative, tumor mutation burden 0, KRAS G 12R, Elevated CA 19-9 Elevated liver enzymes and bilirubin Progressive rise in liver enzymes and bilirubin 03/24/2024 CTs 03/24/2024-increase in mild to moderate biliary duct dilatation; pancreatic head/uncinate process mass and liver metastasis relatively similar 03/25/2024: Biliary stricture with upstream  dilation, head of pancreas with double duct sign uncovered metal stent 03/26/2024 ultrasound biopsy of right liver lesion: Metastatic moderately differentiated adenocarcinoma consistent with a pancreas primary 03/27/2024 CT abdomen/pelvis: Interval placement of a biliary stent extending into the cystic duct, not clearly communicating with the common duct occiput to the obstructing mass.,  Stable biliary duct dilation, stable hepatic metastases and pancreas head mass, development of mild peripancreatic inflammatory stranding Cycle 1 FOLFOX 04/15/2024 Pain secondary to #1 Hypothyroidism Family history of colon cancer Microcytic anemia 03/24/2024 Hemoglobin electrophoresis: 94.6% A, 5.4% A2 Hyperthyroidism status post RAI in 2009 Gastroesophageal reflux disease Glaucoma Hypertension Hospital admission 03/24/2024 with obstructive jaundice, status post common duct stent placement Hospital admission 03/27/2024 with post ERCP pancreatitis, fever migration of bile duct stent to the cystic duct, increased abdominal pain; 03/28/2024 ERCP with metal stent removal followed by placement of self-expanding metal stent        Disposition: Mr. Hoback appears stable.  He tolerated cycle 1 FOLFOX well.  We reviewed the chemistry panel from today.  Bilirubin is lower.  We discussed the plan is to add irinotecan to the regimen if the bilirubin is less than 2 when he is here next week.  He will return for follow-up as scheduled 04/28/2024.  We are available to see him sooner if needed.   Olam Ned ANP/GNP-BC   04/21/2024  8:51 AM

## 2024-04-21 NOTE — Patient Instructions (Signed)

## 2024-04-22 ENCOUNTER — Inpatient Hospital Stay

## 2024-04-22 ENCOUNTER — Other Ambulatory Visit: Payer: Self-pay | Admitting: Oncology

## 2024-04-22 NOTE — Progress Notes (Signed)
 [facility] CSW Progress Note  Clinical Child Psychotherapist contacted patient by phone for to assess for any psychosocial needs as patient goes through treatment. Patient completed Cycle 1 on 10/27, patient denied any major side effects since getting chemotherapy. Patient described his mood as great and reported no emotional challenges since starting treatment.    Interventions: CSW discussed SSDI application due to chart note regarding patient questions. Patient confirmed niece is continuing to assist with applying for SSDI. No concerns noted.  CSW assessed [employer] Eligibility (does not meet eligibility). Patient understands if he decides to apply for income based government assistance and is approved, notify CSW.       Follow Up Plan:  No follow up scheduled. Patient appears to be tolerating treatment. Patient understands to contact CSW if needs arise.     Lizbeth Sprague, LCSW Clinical Social Worker [facility]

## 2024-04-23 ENCOUNTER — Inpatient Hospital Stay

## 2024-04-23 ENCOUNTER — Encounter

## 2024-04-28 ENCOUNTER — Inpatient Hospital Stay: Admitting: Nutrition

## 2024-04-28 ENCOUNTER — Encounter: Payer: Self-pay | Admitting: Nurse Practitioner

## 2024-04-28 ENCOUNTER — Inpatient Hospital Stay

## 2024-04-28 ENCOUNTER — Inpatient Hospital Stay: Admitting: Nurse Practitioner

## 2024-04-28 VITALS — BP 139/89 | HR 78 | Temp 98.0°F | Resp 18 | Ht 76.0 in | Wt 197.7 lb

## 2024-04-28 VITALS — BP 134/85 | HR 73 | Temp 98.0°F | Resp 18

## 2024-04-28 DIAGNOSIS — C259 Malignant neoplasm of pancreas, unspecified: Secondary | ICD-10-CM

## 2024-04-28 DIAGNOSIS — Z5111 Encounter for antineoplastic chemotherapy: Secondary | ICD-10-CM | POA: Diagnosis not present

## 2024-04-28 LAB — CMP (CANCER CENTER ONLY)
ALT: 44 U/L (ref 0–44)
AST: 32 U/L (ref 15–41)
Albumin: 4.2 g/dL (ref 3.5–5.0)
Alkaline Phosphatase: 192 U/L — ABNORMAL HIGH (ref 38–126)
Anion gap: 10 (ref 5–15)
BUN: 16 mg/dL (ref 6–20)
CO2: 26 mmol/L (ref 22–32)
Calcium: 9.8 mg/dL (ref 8.9–10.3)
Chloride: 103 mmol/L (ref 98–111)
Creatinine: 0.99 mg/dL (ref 0.61–1.24)
GFR, Estimated: >60 mL/min (ref >60)
Glucose, Bld: 151 mg/dL — ABNORMAL HIGH (ref 70–99)
Potassium: 3.4 mmol/L — ABNORMAL LOW (ref 3.5–5.1)
Sodium: 139 mmol/L (ref 135–145)
Total Bilirubin: 2.8 mg/dL — ABNORMAL HIGH (ref 0.0–1.2)
Total Protein: 7.5 g/dL (ref 6.5–8.1)

## 2024-04-28 LAB — CBC WITH DIFFERENTIAL (CANCER CENTER ONLY)
Abs Immature Granulocytes: 0.01 K/uL (ref 0.00–0.07)
Basophils Absolute: 0 K/uL (ref 0.0–0.1)
Basophils Relative: 1 %
Eosinophils Absolute: 0.1 K/uL (ref 0.0–0.5)
Eosinophils Relative: 3 %
HCT: 30 % — ABNORMAL LOW (ref 39.0–52.0)
Hemoglobin: 9.5 g/dL — ABNORMAL LOW (ref 13.0–17.0)
Immature Granulocytes: 0 %
Lymphocytes Relative: 21 %
Lymphs Abs: 1.1 K/uL (ref 0.7–4.0)
MCH: 19.7 pg — ABNORMAL LOW (ref 26.0–34.0)
MCHC: 31.7 g/dL (ref 30.0–36.0)
MCV: 62.1 fL — ABNORMAL LOW (ref 80.0–100.0)
Monocytes Absolute: 0.6 K/uL (ref 0.1–1.0)
Monocytes Relative: 12 %
Neutro Abs: 3.4 K/uL (ref 1.7–7.7)
Neutrophils Relative %: 63 %
Platelet Count: 177 K/uL (ref 150–400)
RBC: 4.83 MIL/uL (ref 4.22–5.81)
RDW: 17.2 % — ABNORMAL HIGH (ref 11.5–15.5)
WBC Count: 5.3 K/uL (ref 4.0–10.5)
nRBC: 0 % (ref 0.0–0.2)

## 2024-04-28 MED ORDER — HEPARIN SOD (PORK) LOCK FLUSH 100 UNIT/ML IV SOLN: 500.0000 [IU] | Freq: Once | INTRAVENOUS | Status: DC | PRN

## 2024-04-28 MED ORDER — DEXTROSE 5 % IV SOLN: INTRAVENOUS | Status: DC

## 2024-04-28 MED ORDER — DEXAMETHASONE SOD PHOSPHATE PF 10 MG/ML IJ SOLN
10.0000 mg | Freq: Once | INTRAMUSCULAR | Status: AC
  Administered 2024-04-28: 10 mg via INTRAVENOUS

## 2024-04-28 MED ORDER — SODIUM CHLORIDE 0.9% FLUSH: 3.0000 mL | INTRAVENOUS | Status: DC | PRN

## 2024-04-28 MED ORDER — PALONOSETRON HCL INJECTION 0.25 MG/5ML
0.2500 mg | Freq: Once | INTRAVENOUS | Status: AC
  Administered 2024-04-28: 0.25 mg via INTRAVENOUS
  Filled 2024-04-28: qty 5

## 2024-04-28 MED ORDER — OXALIPLATIN CHEMO INJECTION 100 MG/20ML
85.0000 mg/m2 | Freq: Once | INTRAVENOUS | Status: AC
  Administered 2024-04-28: 200 mg via INTRAVENOUS
  Filled 2024-04-28: qty 40

## 2024-04-28 MED ORDER — ALTEPLASE 2 MG IJ SOLR: 2.0000 mg | Freq: Once | INTRAMUSCULAR | Status: DC | PRN

## 2024-04-28 MED ORDER — SODIUM CHLORIDE 0.9% FLUSH: 10.0000 mL | INTRAVENOUS | Status: DC | PRN

## 2024-04-28 MED ORDER — SODIUM CHLORIDE 0.9 % IV SOLN
1800.0000 mg/m2 | INTRAVENOUS | Status: DC
  Administered 2024-04-28: 3900 mg via INTRAVENOUS
  Filled 2024-04-28: qty 78

## 2024-04-28 MED ORDER — LEUCOVORIN CALCIUM INJECTION 350 MG
200.0000 mg/m2 | Freq: Once | INTRAVENOUS | Status: AC
  Administered 2024-04-28: 436 mg via INTRAVENOUS
  Filled 2024-04-28: qty 21.8

## 2024-04-28 MED ORDER — HEPARIN SOD (PORK) LOCK FLUSH 100 UNIT/ML IV SOLN: 250.0000 [IU] | Freq: Once | INTRAVENOUS | Status: DC | PRN

## 2024-04-28 NOTE — Progress Notes (Signed)
  [facility] OFFICE PROGRESS NOTE   Diagnosis: Pancreas cancer  INTERVAL HISTORY:   Douglas Harper returns as scheduled.  He completed a cycle of FOLFOX 04/15/2024.  He feels well.  He denies nausea/vomiting.  No mouth sores.  No diarrhea.  He has a good appetite.  He is gaining weight.  He denies abdominal pain.  Objective:  Vital signs in last 24 hours:  Blood pressure 139/89, pulse 78, temperature 98 F (36.7 C), temperature source Temporal, resp. rate 18, height 6' 4 (1.93 m), weight 197 lb 11.2 oz (89.7 kg), SpO2 100%.    HEENT: Question mild scleral icterus.  No thrush or ulcers. Resp: Lungs clear bilaterally. Cardio: Regular rate and rhythm. GI: No hepatosplenomegaly.  Nontender. Vascular: No leg edema.  Port-A-Cath without erythema.  Lab Results:  Lab Results  Component Value Date   WBC 5.7 04/14/2024   HGB 9.7 (L) 04/14/2024   HCT 29.8 (L) 04/14/2024   MCV 60.7 (L) 04/14/2024   PLT 282 04/14/2024   NEUTROABS 4.2 04/14/2024    Imaging:  No results found.  Medications: I have reviewed the patient's current medications.  Assessment/Plan: Pancreas cancer, stage IV (cT1c, cN0, pM1) 03/09/2024 CTs chest, abdomen, and pelvis: 1.6 x 1.9 cm pancreas head mass, upstream pancreatic duct dilation, multiple hypoattenuating liver lesions, fat stranding surrounding the pancreas head and proximal duodenum, no metastatic disease in the chest Ultrasound-guided biopsy of left liver mass 03/11/2024: Invasive moderately differentiated pancreatic adenocarcinoma, pancreas with severe ductal dysplasia (PanIN-3), no hepatic parenchyma is present.  MSS, HRD signature negative, tumor mutation burden 0, KRAS G 12R, Elevated CA 19-9 Elevated liver enzymes and bilirubin Progressive rise in liver enzymes and bilirubin 03/24/2024 CTs 03/24/2024-increase in mild to moderate biliary duct dilatation; pancreatic head/uncinate process mass and liver metastasis relatively  similar 03/25/2024: Biliary stricture with upstream dilation, head of pancreas with double duct sign uncovered metal stent 03/26/2024 ultrasound biopsy of right liver lesion: Metastatic moderately differentiated adenocarcinoma consistent with a pancreas primary 03/27/2024 CT abdomen/pelvis: Interval placement of a biliary stent extending into the cystic duct, not clearly communicating with the common duct occiput to the obstructing mass.,  Stable biliary duct dilation, stable hepatic metastases and pancreas head mass, development of mild peripancreatic inflammatory stranding Cycle 1 FOLFOX 04/15/2024 Cycle 2 FOLFOX 04/28/2024 Pain secondary to #1 Hypothyroidism Family history of colon cancer Microcytic anemia 03/24/2024 Hemoglobin electrophoresis: 94.6% A, 5.4% A2 Hyperthyroidism status post RAI in 2009 Gastroesophageal reflux disease Glaucoma Hypertension Hospital admission 03/24/2024 with obstructive jaundice, status post common duct stent placement Hospital admission 03/27/2024 with post ERCP pancreatitis, fever migration of bile duct stent to the cystic duct, increased abdominal pain; 03/28/2024 ERCP with metal stent removal followed by placement of self-expanding metal stent  Disposition: Douglas Harper appears stable.  He tolerated cycle 1 FOLFOX well.  Plan to proceed with cycle 2 today as scheduled.  We have previously discussed adding irinotecan to the regimen.  However, bilirubin remains above 2 so irinotecan will not be added.  CBC and chemistry panel reviewed.  Labs are adequate for treatment.  As noted above bilirubin remains elevated.  He will return for a follow-up chemistry panel in 1 week.  He will return for follow-up and treatment in 2 weeks.  We are available to see him sooner if needed.    Olam Ned ANP/GNP-BC   04/28/2024  8:31 AM

## 2024-04-28 NOTE — Progress Notes (Signed)
 Patient seen by Olam Ned NP today  Vitals are within treatment parameters:Yes   Labs are within treatment parameters: Yes total bilirubin 2.8 K+ 3.4  Treatment plan has been signed: Yes   Per physician team, Patient is ready for treatment. Please note the following modifications: bilirubin remains above 2 so irinotecan will not be added.

## 2024-04-28 NOTE — Progress Notes (Signed)
 Nutrition follow up completed with patient. Patient receiving FOLFIRINOX for Pancreas cancer and followed by Dr. Cloretta.  Weight improved: 197 pounds 11.2 ox November 10 192 pounds 3.2 oz November 3 195 pounds 8 oz. October 20  Labs include K 3.4 and glucose 151  Patient interested in what other foods he could add to his diet. He carefully avoids foods which are high in fiber or acidic or spicy. He tries a small portion and waits to see how it goes down. Denies nausea, vomiting, constipation and diarrhea. His aunt is visiting and cooking for him. He eats small amounts throughout the day.  Nutrition Diagnosis: Food and Nutrition Related Knowledge Deficit continues  Intervention: Continue small frequent meals and snacks. Reviewed low fat, lower fiber foods and provided specific foods patient could try and observe tolerance. Encouraged trying a high calorie ONS, 4 oz at one time.  Monitoring, Evaluation, Goals: Tolerate adequate calories and protein to minimize weight loss throughout treatment.  Next Visit:Monday, November 24, during infusion.

## 2024-04-28 NOTE — Patient Instructions (Signed)
 [facility] - A DEPT OF [facility]  Discharge Instructions: Thank you for choosing [facility] to provide your oncology and hematology care.   If you have a lab appointment with the Cancer Center, please go directly to the Cancer Center and check in at the registration area.   Wear comfortable clothing and clothing appropriate for easy access to any Portacath or PICC line.   We strive to give you quality time with your provider. You may need to reschedule your appointment if you arrive late (15 or more minutes).  Arriving late affects you and other patients whose appointments are after yours.  Also, if you miss three or more appointments without notifying the office, you may be dismissed from the clinic at the provider's discretion.      For prescription refill requests, have your pharmacy contact our office and allow 72 hours for refills to be completed.    Today you received the following chemotherapy and/or immunotherapy agents: oxaliplatin, leucovirin, fluorouracil.      To help prevent nausea and vomiting after your treatment, we encourage you to take your nausea medication as directed.  BELOW ARE SYMPTOMS THAT SHOULD BE REPORTED IMMEDIATELY: *FEVER GREATER THAN 100.4 F (38 C) OR HIGHER *CHILLS OR SWEATING *NAUSEA AND VOMITING THAT IS NOT CONTROLLED WITH YOUR NAUSEA MEDICATION *UNUSUAL SHORTNESS OF BREATH *UNUSUAL BRUISING OR BLEEDING *URINARY PROBLEMS (pain or burning when urinating, or frequent urination) *BOWEL PROBLEMS (unusual diarrhea, constipation, pain near the anus) TENDERNESS IN MOUTH AND THROAT WITH OR WITHOUT PRESENCE OF ULCERS (sore throat, sores in mouth, or a toothache) UNUSUAL RASH, SWELLING OR PAIN  UNUSUAL VAGINAL DISCHARGE OR ITCHING   Items with * indicate a potential emergency and should be followed up as soon as possible or go to the Emergency Department if any problems should occur.  Please show the CHEMOTHERAPY  ALERT CARD or IMMUNOTHERAPY ALERT CARD at check-in to the Emergency Department and triage nurse.  Should you have questions after your visit or need to cancel or reschedule your appointment, please contact [facility] - A DEPT OF [facility]  Dept: [phone]  and follow the prompts.  Office hours are 8:00 a.m. to 4:30 p.m. Monday - Friday. Please note that voicemails left after 4:00 p.m. may not be returned until the following business day.  We are closed weekends and major holidays. You have access to a nurse at all times for urgent questions. Please call the main number to the clinic Dept: [phone] and follow the prompts.   For any non-urgent questions, you may also contact your provider using MyChart. We now offer e-Visits for anyone 39 and older to request care online for non-urgent symptoms. For details visit [email or web address].   Also download the MyChart app! Go to the app store, search MyChart, open the app, select [facility], and log in with your MyChart username and password. ___________________________________________ Oxaliplatin Injection What is this medication? OXALIPLATIN (ox AL i PLA tin) treats colorectal cancer. It works by slowing down the growth of cancer cells. This medicine may be used for other purposes; ask your health care provider or pharmacist if you have questions. COMMON BRAND NAME(S): Eloxatin What should I tell my care team before I take this medication? They need to know if you have any of these conditions: Heart disease History of irregular heartbeat or rhythm Liver disease Low blood cell levels (white cells, red cells, and platelets) Lung or breathing  disease, such as asthma Take medications that treat or prevent blood clots Tingling of the fingers, toes, or other nerve disorder An unusual or allergic reaction to oxaliplatin, other medications, foods, dyes, or preservatives If you or your partner are pregnant or  trying to get pregnant Breast-feeding How should I use this medication? This medication is injected into a vein. It is given by your care team in a hospital or clinic setting. Talk to your care team about the use of this medication in children. Special care may be needed. Overdosage: If you think you have taken too much of this medicine contact a poison control center or emergency room at once. NOTE: This medicine is only for you. Do not share this medicine with others. What if I miss a dose? Keep appointments for follow-up doses. It is important not to miss a dose. Call your care team if you are unable to keep an appointment. What may interact with this medication? Do not take this medication with any of the following: Cisapride Dronedarone Pimozide Thioridazine This medication may also interact with the following: Aspirin and aspirin-like medications Certain medications that treat or prevent blood clots, such as warfarin, apixaban, dabigatran, and rivaroxaban Cisplatin Cyclosporine Diuretics Medications for infection, such as acyclovir, adefovir, amphotericin B, bacitracin, cidofovir, foscarnet, ganciclovir, gentamicin, pentamidine, vancomycin NSAIDs, medications for pain and inflammation, such as ibuprofen  or naproxen Other medications that cause heart rhythm changes Pamidronate Zoledronic acid This list may not describe all possible interactions. Give your health care provider a list of all the medicines, herbs, non-prescription drugs, or dietary supplements you use. Also tell them if you smoke, drink alcohol, or use illegal drugs. Some items may interact with your medicine. What should I watch for while using this medication? Your condition will be monitored carefully while you are receiving this medication. You may need blood work while taking this medication. This medication may make you feel generally unwell. This is not uncommon as chemotherapy can affect healthy cells as well  as cancer cells. Report any side effects. Continue your course of treatment even though you feel ill unless your care team tells you to stop. This medication may increase your risk of getting an infection. Call your care team for advice if you get a fever, chills, sore throat, or other symptoms of a cold or flu. Do not treat yourself. Try to avoid being around people who are sick. Avoid taking medications that contain aspirin, acetaminophen, ibuprofen , naproxen, or ketoprofen unless instructed by your care team. These medications may hide a fever. Be careful brushing or flossing your teeth or using a toothpick because you may get an infection or bleed more easily. If you have any dental work done, tell your dentist you are receiving this medication. This medication can make you more sensitive to cold. Do not drink cold drinks or use ice. Cover exposed skin before coming in contact with cold temperatures or cold objects. When out in cold weather wear warm clothing and cover your mouth and nose to warm the air that goes into your lungs. Tell your care team if you get sensitive to the cold. Talk to your care team if you or your partner are pregnant or think either of you might be pregnant. This medication can cause serious birth defects if taken during pregnancy and for 9 months after the last dose. A negative pregnancy test is required before starting this medication. A reliable form of contraception is recommended while taking this medication and for 9 months  after the last dose. Talk to your care team about effective forms of contraception. Do not father a child while taking this medication and for 6 months after the last dose. Use a condom while having sex during this time period. Do not breastfeed while taking this medication and for 3 months after the last dose. This medication may cause infertility. Talk to your care team if you are concerned about your fertility. What side effects may I notice from  receiving this medication? Side effects that you should report to your care team as soon as possible: Allergic reactions--skin rash, itching, hives, swelling of the face, lips, tongue, or throat Bleeding--bloody or black, tar-like stools, vomiting blood or brown material that looks like coffee grounds, red or dark brown urine, small red or purple spots on skin, unusual bruising or bleeding Dry cough, shortness of breath or trouble breathing Heart rhythm changes--fast or irregular heartbeat, dizziness, feeling faint or lightheaded, chest pain, trouble breathing Infection--fever, chills, cough, sore throat, wounds that don't heal, pain or trouble when passing urine, general feeling of discomfort or being unwell Liver injury--right upper belly pain, loss of appetite, nausea, light-colored stool, dark yellow or brown urine, yellowing skin or eyes, unusual weakness or fatigue Low red blood cell level--unusual weakness or fatigue, dizziness, headache, trouble breathing Muscle injury--unusual weakness or fatigue, muscle pain, dark yellow or brown urine, decrease in amount of urine Pain, tingling, or numbness in the hands or feet Sudden and severe headache, confusion, change in vision, seizures, which may be signs of posterior reversible encephalopathy syndrome (PRES) Unusual bruising or bleeding Side effects that usually do not require medical attention (report to your care team if they continue or are bothersome): Diarrhea Nausea Pain, redness, or swelling with sores inside the mouth or throat Unusual weakness or fatigue Vomiting This list may not describe all possible side effects. Call your doctor for medical advice about side effects. You may report side effects to FDA at 1-800-FDA-1088. Where should I keep my medication? This medication is given in a hospital or clinic. It will not be stored at home. NOTE: This sheet is a summary. It may not cover all possible information. If you have questions  about this medicine, talk to your doctor, pharmacist, or health care provider.  2024 Elsevier/Gold Standard (2023-05-18 00:00:00) _________________________________ Leucovorin Injection What is this medication? LEUCOVORIN (loo koe VOR in) prevents side effects from certain medications, such as methotrexate. It works by increasing folate levels. This helps protect healthy cells in your body. It may also be used to treat anemia caused by low levels of folate. It can also be used with fluorouracil, a type of chemotherapy, to treat colorectal cancer. It works by increasing the effects of fluorouracil in the body. This medicine may be used for other purposes; ask your health care provider or pharmacist if you have questions. What should I tell my care team before I take this medication? They need to know if you have any of these conditions: Anemia from low levels of vitamin B12 in the blood An unusual or allergic reaction to leucovorin, folic acid, other medications, foods, dyes, or preservatives Pregnant or trying to get pregnant Breastfeeding How should I use this medication? This medication is injected into a vein or a muscle. It is given by your care team in a hospital or clinic setting. Talk to your care team about the use of this medication in children. Special care may be needed. Overdosage: If you think you have taken too much  of this medicine contact a poison control center or emergency room at once. NOTE: This medicine is only for you. Do not share this medicine with others. What if I miss a dose? Keep appointments for follow-up doses. It is important not to miss your dose. Call your care team if you are unable to keep an appointment. What may interact with this medication? Capecitabine Fluorouracil Phenobarbital Phenytoin Primidone Trimethoprim;sulfamethoxazole This list may not describe all possible interactions. Give your health care provider a list of all the medicines, herbs,  non-prescription drugs, or dietary supplements you use. Also tell them if you smoke, drink alcohol, or use illegal drugs. Some items may interact with your medicine. What should I watch for while using this medication? Your condition will be monitored carefully while you are receiving this medication. This medication may increase the side effects of 5-fluorouracil. Tell your care team if you have diarrhea or mouth sores that do not get better or that get worse. What side effects may I notice from receiving this medication? Side effects that you should report to your care team as soon as possible: Allergic reactions--skin rash, itching, hives, swelling of the face, lips, tongue, or throat This list may not describe all possible side effects. Call your doctor for medical advice about side effects. You may report side effects to FDA at 1-800-FDA-1088. Where should I keep my medication? This medication is given in a hospital or clinic. It will not be stored at home. NOTE: This sheet is a summary. It may not cover all possible information. If you have questions about this medicine, talk to your doctor, pharmacist, or health care provider.  2024 Elsevier/Gold Standard (2021-11-08 00:00:00) ________________________________ Fluorouracil Injection What is this medication? FLUOROURACIL (flure oh YOOR a sil) treats some types of cancer. It works by slowing down the growth of cancer cells. This medicine may be used for other purposes; ask your health care provider or pharmacist if you have questions. COMMON BRAND NAME(S): Adrucil What should I tell my care team before I take this medication? They need to know if you have any of these conditions: Blood disorders Dihydropyrimidine dehydrogenase (DPD) deficiency Infection, such as chickenpox, cold sores, herpes Kidney disease Liver disease Poor nutrition Recent or ongoing radiation therapy An unusual or allergic reaction to fluorouracil, other  medications, foods, dyes, or preservatives If you or your partner are pregnant or trying to get pregnant Breast-feeding How should I use this medication? This medication is injected into a vein. It is administered by your care team in a hospital or clinic setting. Talk to your care team about the use of this medication in children. Special care may be needed. Overdosage: If you think you have taken too much of this medicine contact a poison control center or emergency room at once. NOTE: This medicine is only for you. Do not share this medicine with others. What if I miss a dose? Keep appointments for follow-up doses. It is important not to miss your dose. Call your care team if you are unable to keep an appointment. What may interact with this medication? Do not take this medication with any of the following: Live virus vaccines This medication may also interact with the following: Medications that treat or prevent blood clots, such as warfarin, enoxaparin, dalteparin This list may not describe all possible interactions. Give your health care provider a list of all the medicines, herbs, non-prescription drugs, or dietary supplements you use. Also tell them if you smoke, drink alcohol, or use  illegal drugs. Some items may interact with your medicine. What should I watch for while using this medication? Your condition will be monitored carefully while you are receiving this medication. This medication may make you feel generally unwell. This is not uncommon as chemotherapy can affect healthy cells as well as cancer cells. Report any side effects. Continue your course of treatment even though you feel ill unless your care team tells you to stop. In some cases, you may be given additional medications to help with side effects. Follow all directions for their use. This medication may increase your risk of getting an infection. Call your care team for advice if you get a fever, chills, sore throat, or  other symptoms of a cold or flu. Do not treat yourself. Try to avoid being around people who are sick. This medication may increase your risk to bruise or bleed. Call your care team if you notice any unusual bleeding. Be careful brushing or flossing your teeth or using a toothpick because you may get an infection or bleed more easily. If you have any dental work done, tell your dentist you are receiving this medication. Avoid taking medications that contain aspirin, acetaminophen, ibuprofen , naproxen, or ketoprofen unless instructed by your care team. These medications may hide a fever. Do not treat diarrhea with over the counter products. Contact your care team if you have diarrhea that lasts more than 2 days or if it is severe and watery. This medication can make you more sensitive to the sun. Keep out of the sun. If you cannot avoid being in the sun, wear protective clothing and sunscreen. Do not use sun lamps, tanning beds, or tanning booths. Talk to your care team if you or your partner wish to become pregnant or think you might be pregnant. This medication can cause serious birth defects if taken during pregnancy and for 3 months after the last dose. A reliable form of contraception is recommended while taking this medication and for 3 months after the last dose. Talk to your care team about effective forms of contraception. Do not father a child while taking this medication and for 3 months after the last dose. Use a condom while having sex during this time period. Do not breastfeed while taking this medication. This medication may cause infertility. Talk to your care team if you are concerned about your fertility. What side effects may I notice from receiving this medication? Side effects that you should report to your care team as soon as possible: Allergic reactions--skin rash, itching, hives, swelling of the face, lips, tongue, or throat Heart attack--pain or tightness in the chest, shoulders,  arms, or jaw, nausea, shortness of breath, cold or clammy skin, feeling faint or lightheaded Heart failure--shortness of breath, swelling of the ankles, feet, or hands, sudden weight gain, unusual weakness or fatigue Heart rhythm changes--fast or irregular heartbeat, dizziness, feeling faint or lightheaded, chest pain, trouble breathing High ammonia level--unusual weakness or fatigue, confusion, loss of appetite, nausea, vomiting, seizures Infection--fever, chills, cough, sore throat, wounds that don't heal, pain or trouble when passing urine, general feeling of discomfort or being unwell Low red blood cell level--unusual weakness or fatigue, dizziness, headache, trouble breathing Pain, tingling, or numbness in the hands or feet, muscle weakness, change in vision, confusion or trouble speaking, loss of balance or coordination, trouble walking, seizures Redness, swelling, and blistering of the skin over hands and feet Severe or prolonged diarrhea Unusual bruising or bleeding Side effects that usually do not require  medical attention (report to your care team if they continue or are bothersome): Dry skin Headache Increased tears Nausea Pain, redness, or swelling with sores inside the mouth or throat Sensitivity to light Vomiting This list may not describe all possible side effects. Call your doctor for medical advice about side effects. You may report side effects to FDA at 1-800-FDA-1088. Where should I keep my medication? This medication is given in a hospital or clinic. It will not be stored at home. NOTE: This sheet is a summary. It may not cover all possible information. If you have questions about this medicine, talk to your doctor, pharmacist, or health care provider.  2024 Elsevier/Gold Standard (2021-10-11 00:00:00)

## 2024-04-30 ENCOUNTER — Other Ambulatory Visit: Payer: Self-pay

## 2024-04-30 ENCOUNTER — Inpatient Hospital Stay

## 2024-04-30 NOTE — Patient Instructions (Signed)
 [facility] - A DEPT OF . [facility]  Discharge Instructions: Thank you for choosing [facility] to provide your oncology and hematology care.   If you have a lab appointment with the Cancer Center, please go directly to the Cancer Center and check in at the registration area.   Wear comfortable clothing and clothing appropriate for easy access to any Portacath or PICC line.   We strive to give you quality time with your provider. You may need to reschedule your appointment if you arrive late (15 or more minutes).  Arriving late affects you and other patients whose appointments are after yours.  Also, if you miss three or more appointments without notifying the office, you may be dismissed from the clinic at the provider's discretion.      For prescription refill requests, have your pharmacy contact our office and allow 72 hours for refills to be completed.    Today you received the following PUMP Stop.    To help prevent nausea and vomiting after your treatment, we encourage you to take your nausea medication as directed.  BELOW ARE SYMPTOMS THAT SHOULD BE REPORTED IMMEDIATELY: *FEVER GREATER THAN 100.4 F (38 C) OR HIGHER *CHILLS OR SWEATING *NAUSEA AND VOMITING THAT IS NOT CONTROLLED WITH YOUR NAUSEA MEDICATION *UNUSUAL SHORTNESS OF BREATH *UNUSUAL BRUISING OR BLEEDING *URINARY PROBLEMS (pain or burning when urinating, or frequent urination) *BOWEL PROBLEMS (unusual diarrhea, constipation, pain near the anus) TENDERNESS IN MOUTH AND THROAT WITH OR WITHOUT PRESENCE OF ULCERS (sore throat, sores in mouth, or a toothache) UNUSUAL RASH, SWELLING OR PAIN  UNUSUAL VAGINAL DISCHARGE OR ITCHING   Items with * indicate a potential emergency and should be followed up as soon as possible or go to the Emergency Department if any problems should occur.  Please show the CHEMOTHERAPY ALERT CARD or IMMUNOTHERAPY ALERT CARD at check-in to the Emergency  Department and triage nurse.  Should you have questions after your visit or need to cancel or reschedule your appointment, please contact [facility] - A DEPT OF [facility]  Dept: [phone]  and follow the prompts.  Office hours are 8:00 a.m. to 4:30 p.m. Monday - Friday. Please note that voicemails left after 4:00 p.m. may not be returned until the following business day.  We are closed weekends and major holidays. You have access to a nurse at all times for urgent questions. Please call the main number to the clinic Dept: [phone] and follow the prompts.   For any non-urgent questions, you may also contact your provider using MyChart. We now offer e-Visits for anyone 8 and older to request care online for non-urgent symptoms. For details visit [email or web address].   Also download the MyChart app! Go to the app store, search MyChart, open the app, select [facility], and log in with your MyChart username and password.

## 2024-04-30 NOTE — Progress Notes (Signed)
 Patient presents for PUMP STOP. Pump removed without issue. Patients port flushed without difficulty.  Good blood return noted with no bruising or swelling noted at site.  Band aid applied.  VSS with discharge and left in satisfactory condition with no s/s of distress noted.

## 2024-05-01 ENCOUNTER — Other Ambulatory Visit: Payer: Self-pay

## 2024-05-05 ENCOUNTER — Other Ambulatory Visit: Payer: Self-pay | Admitting: *Deleted

## 2024-05-05 ENCOUNTER — Encounter: Payer: Self-pay | Admitting: *Deleted

## 2024-05-05 ENCOUNTER — Other Ambulatory Visit (HOSPITAL_BASED_OUTPATIENT_CLINIC_OR_DEPARTMENT_OTHER): Payer: Self-pay

## 2024-05-05 ENCOUNTER — Inpatient Hospital Stay

## 2024-05-05 DIAGNOSIS — C259 Malignant neoplasm of pancreas, unspecified: Secondary | ICD-10-CM

## 2024-05-05 DIAGNOSIS — Z5111 Encounter for antineoplastic chemotherapy: Secondary | ICD-10-CM | POA: Diagnosis not present

## 2024-05-05 LAB — CMP (CANCER CENTER ONLY)
ALT: 74 U/L — ABNORMAL HIGH (ref 0–44)
AST: 54 U/L — ABNORMAL HIGH (ref 15–41)
Albumin: 4.1 g/dL (ref 3.5–5.0)
Alkaline Phosphatase: 221 U/L — ABNORMAL HIGH (ref 38–126)
Anion gap: 9 (ref 5–15)
BUN: 10 mg/dL (ref 6–20)
CO2: 28 mmol/L (ref 22–32)
Calcium: 10 mg/dL (ref 8.9–10.3)
Chloride: 102 mmol/L (ref 98–111)
Creatinine: 0.93 mg/dL (ref 0.61–1.24)
GFR, Estimated: >60 mL/min (ref >60)
Glucose, Bld: 170 mg/dL — ABNORMAL HIGH (ref 70–99)
Potassium: 3.6 mmol/L (ref 3.5–5.1)
Sodium: 139 mmol/L (ref 135–145)
Total Bilirubin: 2 mg/dL — ABNORMAL HIGH (ref 0.0–1.2)
Total Protein: 7.8 g/dL (ref 6.5–8.1)

## 2024-05-05 MED ORDER — OMEPRAZOLE 40 MG PO CPDR
40.0000 mg | DELAYED_RELEASE_CAPSULE | Freq: Every day | ORAL | 3 refills | Status: DC
  Filled 2024-05-05 (×2): qty 30, 30d supply, fill #0
  Filled 2024-05-05: qty 90, 90d supply, fill #0

## 2024-05-05 NOTE — Progress Notes (Signed)
 He is scheduled for lab only today then treatment and labs next week. He wants the phlebotomist to draw his labs today. No port flush at pt request.

## 2024-05-05 NOTE — Patient Instructions (Signed)

## 2024-05-06 ENCOUNTER — Telehealth: Payer: Self-pay

## 2024-05-06 NOTE — Telephone Encounter (Signed)
-----   Message from Olam Ned sent at 05/06/2024  8:25 AM EST ----- Please let him know bilirubin is lower, follow-up as scheduled.

## 2024-05-06 NOTE — Telephone Encounter (Signed)
 Patient gave verbal understanding and had no further questions.

## 2024-05-11 ENCOUNTER — Other Ambulatory Visit: Payer: Self-pay | Admitting: Oncology

## 2024-05-12 ENCOUNTER — Other Ambulatory Visit: Payer: Self-pay

## 2024-05-12 ENCOUNTER — Inpatient Hospital Stay

## 2024-05-12 ENCOUNTER — Other Ambulatory Visit (HOSPITAL_BASED_OUTPATIENT_CLINIC_OR_DEPARTMENT_OTHER): Payer: Self-pay

## 2024-05-12 ENCOUNTER — Inpatient Hospital Stay: Admitting: Oncology

## 2024-05-12 ENCOUNTER — Other Ambulatory Visit: Payer: Self-pay | Admitting: *Deleted

## 2024-05-12 ENCOUNTER — Inpatient Hospital Stay: Admitting: Nurse Practitioner

## 2024-05-12 ENCOUNTER — Inpatient Hospital Stay: Admitting: Nutrition

## 2024-05-12 VITALS — BP 130/86 | HR 78 | Temp 97.8°F | Resp 18 | Ht 76.0 in | Wt 197.0 lb

## 2024-05-12 VITALS — BP 136/75 | HR 77 | Temp 98.2°F | Resp 16

## 2024-05-12 DIAGNOSIS — Z5111 Encounter for antineoplastic chemotherapy: Secondary | ICD-10-CM | POA: Diagnosis not present

## 2024-05-12 DIAGNOSIS — C259 Malignant neoplasm of pancreas, unspecified: Secondary | ICD-10-CM

## 2024-05-12 DIAGNOSIS — Z23 Encounter for immunization: Secondary | ICD-10-CM

## 2024-05-12 LAB — CBC WITH DIFFERENTIAL (CANCER CENTER ONLY)
Abs Immature Granulocytes: 0.01 K/uL (ref 0.00–0.07)
Basophils Absolute: 0 K/uL (ref 0.0–0.1)
Basophils Relative: 1 %
Eosinophils Absolute: 0.1 K/uL (ref 0.0–0.5)
Eosinophils Relative: 2 %
HCT: 31 % — ABNORMAL LOW (ref 39.0–52.0)
Hemoglobin: 9.9 g/dL — ABNORMAL LOW (ref 13.0–17.0)
Immature Granulocytes: 0 %
Lymphocytes Relative: 18 %
Lymphs Abs: 0.9 K/uL (ref 0.7–4.0)
MCH: 19.7 pg — ABNORMAL LOW (ref 26.0–34.0)
MCHC: 31.9 g/dL (ref 30.0–36.0)
MCV: 61.8 fL — ABNORMAL LOW (ref 80.0–100.0)
Monocytes Absolute: 0.6 K/uL (ref 0.1–1.0)
Monocytes Relative: 12 %
Neutro Abs: 3.4 K/uL (ref 1.7–7.7)
Neutrophils Relative %: 67 %
Platelet Count: 154 K/uL (ref 150–400)
RBC: 5.02 MIL/uL (ref 4.22–5.81)
RDW: 18.3 % — ABNORMAL HIGH (ref 11.5–15.5)
WBC Count: 5.1 K/uL (ref 4.0–10.5)
nRBC: 0 % (ref 0.0–0.2)

## 2024-05-12 LAB — CMP (CANCER CENTER ONLY)
ALT: 56 U/L — ABNORMAL HIGH (ref 0–44)
AST: 48 U/L — ABNORMAL HIGH (ref 15–41)
Albumin: 4.1 g/dL (ref 3.5–5.0)
Alkaline Phosphatase: 181 U/L — ABNORMAL HIGH (ref 38–126)
Anion gap: 10 (ref 5–15)
BUN: 15 mg/dL (ref 6–20)
CO2: 25 mmol/L (ref 22–32)
Calcium: 9.8 mg/dL (ref 8.9–10.3)
Chloride: 102 mmol/L (ref 98–111)
Creatinine: 0.96 mg/dL (ref 0.61–1.24)
GFR, Estimated: >60 mL/min (ref >60)
Glucose, Bld: 210 mg/dL — ABNORMAL HIGH (ref 70–99)
Potassium: 3.3 mmol/L — ABNORMAL LOW (ref 3.5–5.1)
Sodium: 137 mmol/L (ref 135–145)
Total Bilirubin: 2.2 mg/dL — ABNORMAL HIGH (ref 0.0–1.2)
Total Protein: 7.7 g/dL (ref 6.5–8.1)

## 2024-05-12 MED ORDER — SODIUM CHLORIDE 0.9% FLUSH: 10.0000 mL | INTRAVENOUS | Status: DC | PRN

## 2024-05-12 MED ORDER — ALTEPLASE 2 MG IJ SOLR: 2.0000 mg | Freq: Once | INTRAMUSCULAR | Status: DC | PRN

## 2024-05-12 MED ORDER — ATROPINE SULFATE 1 MG/ML IV SOLN
0.5000 mg | Freq: Once | INTRAVENOUS | Status: AC | PRN
  Administered 2024-05-12: 0.5 mg via INTRAVENOUS
  Filled 2024-05-12: qty 1

## 2024-05-12 MED ORDER — POTASSIUM CHLORIDE CRYS ER 10 MEQ PO TBCR
10.0000 meq | EXTENDED_RELEASE_TABLET | Freq: Two times a day (BID) | ORAL | 2 refills | Status: DC
  Filled 2024-05-12: qty 60, 30d supply, fill #0

## 2024-05-12 MED ORDER — INFLUENZA VIRUS VACC SPLIT PF (FLUZONE) 0.5 ML IM SUSY: 0.5000 mL | PREFILLED_SYRINGE | Freq: Once | INTRAMUSCULAR | Status: DC

## 2024-05-12 MED ORDER — DEXAMETHASONE SOD PHOSPHATE PF 10 MG/ML IJ SOLN
10.0000 mg | Freq: Once | INTRAMUSCULAR | Status: AC
  Administered 2024-05-12: 10 mg via INTRAVENOUS

## 2024-05-12 MED ORDER — HEPARIN SOD (PORK) LOCK FLUSH 100 UNIT/ML IV SOLN: 500.0000 [IU] | Freq: Once | INTRAVENOUS | Status: DC | PRN

## 2024-05-12 MED ORDER — OXALIPLATIN CHEMO INJECTION 100 MG/20ML
85.0000 mg/m2 | Freq: Once | INTRAVENOUS | Status: AC
  Administered 2024-05-12: 200 mg via INTRAVENOUS
  Filled 2024-05-12: qty 40

## 2024-05-12 MED ORDER — SODIUM CHLORIDE 0.9 % IV SOLN
200.0000 mg/m2 | Freq: Once | INTRAVENOUS | Status: AC
  Administered 2024-05-12: 436 mg via INTRAVENOUS
  Filled 2024-05-12: qty 21.8

## 2024-05-12 MED ORDER — DEXTROSE 5 % IV SOLN: INTRAVENOUS | Status: DC

## 2024-05-12 MED ORDER — SODIUM CHLORIDE 0.9 % IV SOLN
1800.0000 mg/m2 | INTRAVENOUS | Status: DC
  Administered 2024-05-12: 3900 mg via INTRAVENOUS
  Filled 2024-05-12: qty 78

## 2024-05-12 MED ORDER — SODIUM CHLORIDE 0.9 % IV SOLN
100.0000 mg/m2 | Freq: Once | INTRAVENOUS | Status: AC
  Administered 2024-05-12: 200 mg via INTRAVENOUS
  Filled 2024-05-12: qty 10

## 2024-05-12 MED ORDER — SODIUM CHLORIDE 0.9 % IV SOLN
150.0000 mg | Freq: Once | INTRAVENOUS | Status: AC
  Administered 2024-05-12: 150 mg via INTRAVENOUS
  Filled 2024-05-12: qty 5

## 2024-05-12 MED ORDER — PALONOSETRON HCL INJECTION 0.25 MG/5ML
0.2500 mg | Freq: Once | INTRAVENOUS | Status: AC
  Administered 2024-05-12: 0.25 mg via INTRAVENOUS
  Filled 2024-05-12: qty 5

## 2024-05-12 MED ORDER — SODIUM CHLORIDE 0.9% FLUSH: 3.0000 mL | INTRAVENOUS | Status: DC | PRN

## 2024-05-12 MED ORDER — HEPARIN SOD (PORK) LOCK FLUSH 100 UNIT/ML IV SOLN: 250.0000 [IU] | Freq: Once | INTRAVENOUS | Status: DC | PRN

## 2024-05-12 NOTE — Patient Instructions (Signed)

## 2024-05-12 NOTE — Progress Notes (Signed)
 Patient declined flu shot, stating he would get it at his next visit.

## 2024-05-12 NOTE — Patient Instructions (Signed)
 [facility] - A DEPT OF [facility]  Discharge Instructions: Thank you for choosing [facility] to provide your oncology and hematology care.   If you have a lab appointment with the Cancer Center, please go directly to the Cancer Center and check in at the registration area.   Wear comfortable clothing and clothing appropriate for easy access to any Portacath or PICC line.   We strive to give you quality time with your provider. You may need to reschedule your appointment if you arrive late (15 or more minutes).  Arriving late affects you and other patients whose appointments are after yours.  Also, if you miss three or more appointments without notifying the office, you may be dismissed from the clinic at the provider's discretion.      For prescription refill requests, have your pharmacy contact our office and allow 72 hours for refills to be completed.    Today you received the following chemotherapy and/or immunotherapy agents: oxaliplatin , irinotecan , leucovirin, fluorouracil .      To help prevent nausea and vomiting after your treatment, we encourage you to take your nausea medication as directed.  BELOW ARE SYMPTOMS THAT SHOULD BE REPORTED IMMEDIATELY: *FEVER GREATER THAN 100.4 F (38 C) OR HIGHER *CHILLS OR SWEATING *NAUSEA AND VOMITING THAT IS NOT CONTROLLED WITH YOUR NAUSEA MEDICATION *UNUSUAL SHORTNESS OF BREATH *UNUSUAL BRUISING OR BLEEDING *URINARY PROBLEMS (pain or burning when urinating, or frequent urination) *BOWEL PROBLEMS (unusual diarrhea, constipation, pain near the anus) TENDERNESS IN MOUTH AND THROAT WITH OR WITHOUT PRESENCE OF ULCERS (sore throat, sores in mouth, or a toothache) UNUSUAL RASH, SWELLING OR PAIN  UNUSUAL VAGINAL DISCHARGE OR ITCHING   Items with * indicate a potential emergency and should be followed up as soon as possible or go to the Emergency Department if any problems should occur.  Please show the  CHEMOTHERAPY ALERT CARD or IMMUNOTHERAPY ALERT CARD at check-in to the Emergency Department and triage nurse.  Should you have questions after your visit or need to cancel or reschedule your appointment, please contact [facility] - A DEPT OF [facility]  Dept: [phone]  and follow the prompts.  Office hours are 8:00 a.m. to 4:30 p.m. Monday - Friday. Please note that voicemails left after 4:00 p.m. may not be returned until the following business day.  We are closed weekends and major holidays. You have access to a nurse at all times for urgent questions. Please call the main number to the clinic Dept: [phone] and follow the prompts.   For any non-urgent questions, you may also contact your provider using MyChart. We now offer e-Visits for anyone 23 and older to request care online for non-urgent symptoms. For details visit [email or web address].   Also download the MyChart app! Go to the app store, search MyChart, open the app, select [place], and log in with your MyChart username and password. ___________________________________________ Oxaliplatin  Injection What is this medication? OXALIPLATIN  (ox AL i PLA tin) treats colorectal cancer. It works by slowing down the growth of cancer cells. This medicine may be used for other purposes; ask your health care provider or pharmacist if you have questions. COMMON BRAND NAME(S): Eloxatin  What should I tell my care team before I take this medication? They need to know if you have any of these conditions: Heart disease History of irregular heartbeat or rhythm Liver disease Low blood cell levels (white cells, red cells, and platelets) Lung or  breathing disease, such as asthma Take medications that treat or prevent blood clots Tingling of the fingers, toes, or other nerve disorder An unusual or allergic reaction to oxaliplatin , other medications, foods, dyes, or preservatives If you or your partner are  pregnant or trying to get pregnant Breast-feeding How should I use this medication? This medication is injected into a vein. It is given by your care team in a hospital or clinic setting. Talk to your care team about the use of this medication in children. Special care may be needed. Overdosage: If you think you have taken too much of this medicine contact a poison control center or emergency room at once. NOTE: This medicine is only for you. Do not share this medicine with others. What if I miss a dose? Keep appointments for follow-up doses. It is important not to miss a dose. Call your care team if you are unable to keep an appointment. What may interact with this medication? Do not take this medication with any of the following: Cisapride Dronedarone Pimozide Thioridazine This medication may also interact with the following: Aspirin and aspirin-like medications Certain medications that treat or prevent blood clots, such as warfarin, apixaban, dabigatran, and rivaroxaban Cisplatin Cyclosporine Diuretics Medications for infection, such as acyclovir, adefovir, amphotericin B, bacitracin, cidofovir, foscarnet, ganciclovir, gentamicin, pentamidine, vancomycin NSAIDs, medications for pain and inflammation, such as ibuprofen  or naproxen Other medications that cause heart rhythm changes Pamidronate Zoledronic acid This list may not describe all possible interactions. Give your health care provider a list of all the medicines, herbs, non-prescription drugs, or dietary supplements you use. Also tell them if you smoke, drink alcohol, or use illegal drugs. Some items may interact with your medicine. What should I watch for while using this medication? Your condition will be monitored carefully while you are receiving this medication. You may need blood work while taking this medication. This medication may make you feel generally unwell. This is not uncommon as chemotherapy can affect healthy  cells as well as cancer cells. Report any side effects. Continue your course of treatment even though you feel ill unless your care team tells you to stop. This medication may increase your risk of getting an infection. Call your care team for advice if you get a fever, chills, sore throat, or other symptoms of a cold or flu. Do not treat yourself. Try to avoid being around people who are sick. Avoid taking medications that contain aspirin, acetaminophen , ibuprofen , naproxen, or ketoprofen unless instructed by your care team. These medications may hide a fever. Be careful brushing or flossing your teeth or using a toothpick because you may get an infection or bleed more easily. If you have any dental work done, tell your dentist you are receiving this medication. This medication can make you more sensitive to cold. Do not drink cold drinks or use ice. Cover exposed skin before coming in contact with cold temperatures or cold objects. When out in cold weather wear warm clothing and cover your mouth and nose to warm the air that goes into your lungs. Tell your care team if you get sensitive to the cold. Talk to your care team if you or your partner are pregnant or think either of you might be pregnant. This medication can cause serious birth defects if taken during pregnancy and for 9 months after the last dose. A negative pregnancy test is required before starting this medication. A reliable form of contraception is recommended while taking this medication and for 9  months after the last dose. Talk to your care team about effective forms of contraception. Do not father a child while taking this medication and for 6 months after the last dose. Use a condom while having sex during this time period. Do not breastfeed while taking this medication and for 3 months after the last dose. This medication may cause infertility. Talk to your care team if you are concerned about your fertility. What side effects may I  notice from receiving this medication? Side effects that you should report to your care team as soon as possible: Allergic reactions--skin rash, itching, hives, swelling of the face, lips, tongue, or throat Bleeding--bloody or black, tar-like stools, vomiting blood or brown material that looks like coffee grounds, red or dark brown urine, small red or purple spots on skin, unusual bruising or bleeding Dry cough, shortness of breath or trouble breathing Heart rhythm changes--fast or irregular heartbeat, dizziness, feeling faint or lightheaded, chest pain, trouble breathing Infection--fever, chills, cough, sore throat, wounds that don't heal, pain or trouble when passing urine, general feeling of discomfort or being unwell Liver injury--right upper belly pain, loss of appetite, nausea, light-colored stool, dark yellow or brown urine, yellowing skin or eyes, unusual weakness or fatigue Low red blood cell level--unusual weakness or fatigue, dizziness, headache, trouble breathing Muscle injury--unusual weakness or fatigue, muscle pain, dark yellow or brown urine, decrease in amount of urine Pain, tingling, or numbness in the hands or feet Sudden and severe headache, confusion, change in vision, seizures, which may be signs of posterior reversible encephalopathy syndrome (PRES) Unusual bruising or bleeding Side effects that usually do not require medical attention (report to your care team if they continue or are bothersome): Diarrhea Nausea Pain, redness, or swelling with sores inside the mouth or throat Unusual weakness or fatigue Vomiting This list may not describe all possible side effects. Call your doctor for medical advice about side effects. You may report side effects to FDA at 1-800-FDA-1088. Where should I keep my medication? This medication is given in a hospital or clinic. It will not be stored at home. NOTE: This sheet is a summary. It may not cover all possible information. If you have  questions about this medicine, talk to your doctor, pharmacist, or health care provider.  2024 Elsevier/Gold Standard (2023-05-18 00:00:00) _________________________________ Leucovorin  Injection What is this medication? LEUCOVORIN  (loo koe VOR in) prevents side effects from certain medications, such as methotrexate. It works by increasing folate levels. This helps protect healthy cells in your body. It may also be used to treat anemia caused by low levels of folate. It can also be used with fluorouracil , a type of chemotherapy, to treat colorectal cancer. It works by increasing the effects of fluorouracil  in the body. This medicine may be used for other purposes; ask your health care provider or pharmacist if you have questions. What should I tell my care team before I take this medication? They need to know if you have any of these conditions: Anemia from low levels of vitamin B12 in the blood An unusual or allergic reaction to leucovorin , folic acid, other medications, foods, dyes, or preservatives Pregnant or trying to get pregnant Breastfeeding How should I use this medication? This medication is injected into a vein or a muscle. It is given by your care team in a hospital or clinic setting. Talk to your care team about the use of this medication in children. Special care may be needed. Overdosage: If you think you have taken too  much of this medicine contact a poison control center or emergency room at once. NOTE: This medicine is only for you. Do not share this medicine with others. What if I miss a dose? Keep appointments for follow-up doses. It is important not to miss your dose. Call your care team if you are unable to keep an appointment. What may interact with this medication? Capecitabine Fluorouracil  Phenobarbital Phenytoin Primidone Trimethoprim;sulfamethoxazole This list may not describe all possible interactions. Give your health care provider a list of all the medicines,  herbs, non-prescription drugs, or dietary supplements you use. Also tell them if you smoke, drink alcohol, or use illegal drugs. Some items may interact with your medicine. What should I watch for while using this medication? Your condition will be monitored carefully while you are receiving this medication. This medication may increase the side effects of 5-fluorouracil . Tell your care team if you have diarrhea or mouth sores that do not get better or that get worse. What side effects may I notice from receiving this medication? Side effects that you should report to your care team as soon as possible: Allergic reactions--skin rash, itching, hives, swelling of the face, lips, tongue, or throat This list may not describe all possible side effects. Call your doctor for medical advice about side effects. You may report side effects to FDA at 1-800-FDA-1088. Where should I keep my medication? This medication is given in a hospital or clinic. It will not be stored at home. NOTE: This sheet is a summary. It may not cover all possible information. If you have questions about this medicine, talk to your doctor, pharmacist, or health care provider.  2024 Elsevier/Gold Standard (2021-11-08 00:00:00) ________________________________ Fluorouracil  Injection What is this medication? FLUOROURACIL  (flure oh YOOR a sil) treats some types of cancer. It works by slowing down the growth of cancer cells. This medicine may be used for other purposes; ask your health care provider or pharmacist if you have questions. COMMON BRAND NAME(S): Adrucil  What should I tell my care team before I take this medication? They need to know if you have any of these conditions: Blood disorders Dihydropyrimidine dehydrogenase (DPD) deficiency Infection, such as chickenpox, cold sores, herpes Kidney disease Liver disease Poor nutrition Recent or ongoing radiation therapy An unusual or allergic reaction to fluorouracil , other  medications, foods, dyes, or preservatives If you or your partner are pregnant or trying to get pregnant Breast-feeding How should I use this medication? This medication is injected into a vein. It is administered by your care team in a hospital or clinic setting. Talk to your care team about the use of this medication in children. Special care may be needed. Overdosage: If you think you have taken too much of this medicine contact a poison control center or emergency room at once. NOTE: This medicine is only for you. Do not share this medicine with others. What if I miss a dose? Keep appointments for follow-up doses. It is important not to miss your dose. Call your care team if you are unable to keep an appointment. What may interact with this medication? Do not take this medication with any of the following: Live virus vaccines This medication may also interact with the following: Medications that treat or prevent blood clots, such as warfarin, enoxaparin , dalteparin This list may not describe all possible interactions. Give your health care provider a list of all the medicines, herbs, non-prescription drugs, or dietary supplements you use. Also tell them if you smoke, drink alcohol, or  use illegal drugs. Some items may interact with your medicine. What should I watch for while using this medication? Your condition will be monitored carefully while you are receiving this medication. This medication may make you feel generally unwell. This is not uncommon as chemotherapy can affect healthy cells as well as cancer cells. Report any side effects. Continue your course of treatment even though you feel ill unless your care team tells you to stop. In some cases, you may be given additional medications to help with side effects. Follow all directions for their use. This medication may increase your risk of getting an infection. Call your care team for advice if you get a fever, chills, sore throat, or  other symptoms of a cold or flu. Do not treat yourself. Try to avoid being around people who are sick. This medication may increase your risk to bruise or bleed. Call your care team if you notice any unusual bleeding. Be careful brushing or flossing your teeth or using a toothpick because you may get an infection or bleed more easily. If you have any dental work done, tell your dentist you are receiving this medication. Avoid taking medications that contain aspirin, acetaminophen , ibuprofen , naproxen, or ketoprofen unless instructed by your care team. These medications may hide a fever. Do not treat diarrhea with over the counter products. Contact your care team if you have diarrhea that lasts more than 2 days or if it is severe and watery. This medication can make you more sensitive to the sun. Keep out of the sun. If you cannot avoid being in the sun, wear protective clothing and sunscreen. Do not use sun lamps, tanning beds, or tanning booths. Talk to your care team if you or your partner wish to become pregnant or think you might be pregnant. This medication can cause serious birth defects if taken during pregnancy and for 3 months after the last dose. A reliable form of contraception is recommended while taking this medication and for 3 months after the last dose. Talk to your care team about effective forms of contraception. Do not father a child while taking this medication and for 3 months after the last dose. Use a condom while having sex during this time period. Do not breastfeed while taking this medication. This medication may cause infertility. Talk to your care team if you are concerned about your fertility. What side effects may I notice from receiving this medication? Side effects that you should report to your care team as soon as possible: Allergic reactions--skin rash, itching, hives, swelling of the face, lips, tongue, or throat Heart attack--pain or tightness in the chest, shoulders,  arms, or jaw, nausea, shortness of breath, cold or clammy skin, feeling faint or lightheaded Heart failure--shortness of breath, swelling of the ankles, feet, or hands, sudden weight gain, unusual weakness or fatigue Heart rhythm changes--fast or irregular heartbeat, dizziness, feeling faint or lightheaded, chest pain, trouble breathing High ammonia level--unusual weakness or fatigue, confusion, loss of appetite, nausea, vomiting, seizures Infection--fever, chills, cough, sore throat, wounds that don't heal, pain or trouble when passing urine, general feeling of discomfort or being unwell Low red blood cell level--unusual weakness or fatigue, dizziness, headache, trouble breathing Pain, tingling, or numbness in the hands or feet, muscle weakness, change in vision, confusion or trouble speaking, loss of balance or coordination, trouble walking, seizures Redness, swelling, and blistering of the skin over hands and feet Severe or prolonged diarrhea Unusual bruising or bleeding Side effects that usually do not  require medical attention (report to your care team if they continue or are bothersome): Dry skin Headache Increased tears Nausea Pain, redness, or swelling with sores inside the mouth or throat Sensitivity to light Vomiting This list may not describe all possible side effects. Call your doctor for medical advice about side effects. You may report side effects to FDA at 1-800-FDA-1088. Where should I keep my medication? This medication is given in a hospital or clinic. It will not be stored at home. NOTE: This sheet is a summary. It may not cover all possible information. If you have questions about this medicine, talk to your doctor, pharmacist, or health care provider.  2024 Elsevier/Gold Standard (2021-10-11 00:00:00)

## 2024-05-12 NOTE — Progress Notes (Signed)
 Nutrition follow up completed with patient during infusion. Patient receiving FOLFIRINOX for Pancreas cancer and followed by Dr. Cloretta. Irinotecan  added today.  Weight stable at 197 pounds.  Labs include potassium 3.3 and Glucose 210.  Patient has a good appetite and feels wonderful. He denies nausea, vomiting, mouth sores and diarrhea with last treatment. He has a positive attitude. He continues to add foods back to his diet and has tolerated well so far. He is looking forward to a small piece of pizza.  Nutrition Diagnosis: Food and Nutrition Related Knowledge Deficit improved.  Intervention: Educated on continuing small, frequent meals and snacks for weight maintenance. Diet as tolerated. Reminder provided on following low fiber diet if he develops diarrhea as well as taking imodium per MD direction.  Monitoring, evaluation, Goals: Tolerate increased calories and protein to minimize weight loss.  Next Visit: Monday, Dec 22, during infusion.

## 2024-05-12 NOTE — Progress Notes (Signed)
 [facility] OFFICE PROGRESS NOTE   Diagnosis: Pancreas cancer  INTERVAL HISTORY:   Douglas Harper completed another cycle of chemotherapy 04/28/2024.  No nausea, mouth sores, diarrhea, cold sensitivity, or neuropathy symptoms.  No abdominal pain.  Jaundice has resolved.  He reports a good appetite.  Objective:  Vital signs in last 24 hours:  Blood pressure 130/86, pulse 78, temperature 97.8 F (36.6 C), temperature source Temporal, resp. rate 18, height 6' 4 (1.93 m), weight 197 lb (89.4 kg), SpO2 100%.    HEENT:  no thrush or ulcer Resp: Lungs clear bilaterally Cardio: Regular rate and rhythm GI: Nontender, no hepatosplenomegaly, no mass Vascular: Edema  Skin: Palms without erythema  Portacath/PICC-without erythema  Lab Results:  Lab Results  Component Value Date   WBC 5.1 05/12/2024   HGB 9.9 (L) 05/12/2024   HCT 31.0 (L) 05/12/2024   MCV 61.8 (L) 05/12/2024   PLT 154 05/12/2024   NEUTROABS 3.4 05/12/2024    CMP  Lab Results  Component Value Date   NA 137 05/12/2024   K 3.3 (L) 05/12/2024   CL 102 05/12/2024   CO2 25 05/12/2024   GLUCOSE 210 (H) 05/12/2024   BUN 15 05/12/2024   CREATININE 0.96 05/12/2024   CALCIUM  9.8 05/12/2024   PROT 7.7 05/12/2024   ALBUMIN 4.1 05/12/2024   AST 48 (H) 05/12/2024   ALT 56 (H) 05/12/2024   ALKPHOS 181 (H) 05/12/2024   BILITOT 2.2 (H) 05/12/2024   GFRNONAA >60 05/12/2024   GFRAA 83 04/11/2019    Lab Results  Component Value Date   CEA1 43.8 (H) 03/10/2024   RJW800 20,185 (H) 03/10/2024    Lab Results  Component Value Date   INR 1.0 03/24/2024   LABPROT 13.9 03/24/2024    Imaging:  No results found.  Medications: I have reviewed the patient's current medications.   Assessment/Plan:  Pancreas cancer, stage IV (cT1c, cN0, pM1) 03/09/2024 CTs chest, abdomen, and pelvis: 1.6 x 1.9 cm pancreas head mass, upstream pancreatic duct dilation, multiple hypoattenuating liver lesions, fat stranding  surrounding the pancreas head and proximal duodenum, no metastatic disease in the chest Ultrasound-guided biopsy of left liver mass 03/11/2024: Invasive moderately differentiated pancreatic adenocarcinoma, pancreas with severe ductal dysplasia (PanIN-3), no hepatic parenchyma is present.  MSS, HRD signature negative, tumor mutation burden 0, KRAS G 12R, Elevated CA 19-9 Elevated liver enzymes and bilirubin Progressive rise in liver enzymes and bilirubin 03/24/2024 CTs 03/24/2024-increase in mild to moderate biliary duct dilatation; pancreatic head/uncinate process mass and liver metastasis relatively similar 03/25/2024: Biliary stricture with upstream dilation, head of pancreas with double duct sign uncovered metal stent 03/26/2024 ultrasound biopsy of right liver lesion: Metastatic moderately differentiated adenocarcinoma consistent with a pancreas primary 03/27/2024 CT abdomen/pelvis: Interval placement of a biliary stent extending into the cystic duct, not clearly communicating with the common duct occiput to the obstructing mass.,  Stable biliary duct dilation, stable hepatic metastases and pancreas head mass, development of mild peripancreatic inflammatory stranding Cycle 1 FOLFOX 04/15/2024 Cycle 2 FOLFOX 04/28/2024 Cycle 3 FOLFIRI 05/12/2024, irinotecan  dose reduced secondary to elevated bilirubin and liver enzymes Pain secondary to #1 Hypothyroidism Family history of colon cancer Microcytic anemia 03/24/2024 Hemoglobin electrophoresis: 94.6% A, 5.4% A2 Hyperthyroidism status post RAI in 2009 Gastroesophageal reflux disease Glaucoma Hypertension Hospital admission 03/24/2024 with obstructive jaundice, status post common duct stent placement Hospital admission 03/27/2024 with post ERCP pancreatitis, fever migration of bile duct stent to the cystic duct, increased abdominal pain; 03/28/2024 ERCP with metal  stent removal followed by placement of self-expanding metal stent   Disposition: Douglas Harper  appears stable.  He has completed 2 cycles of FOLFOX.  He has tolerated the chemotherapy well.  The bilirubin has improved and is wildly elevated today.  Irinotecan  will be added to the chemotherapy regimen today.  He understands he increased risk hematologic toxicity, diarrhea, and mucositis with the persistent mild elevation of the bilirubin.  He agrees to proceed.  He will return for an office visit and chemotherapy in 2 weeks.  We will follow-up on the CA 19-9 from today. Arley Hof, MD  05/12/2024  9:10 AM

## 2024-05-12 NOTE — Progress Notes (Signed)
 Patient seen by Dr. Arley Hof today  Vitals are within treatment parameters:Yes   Labs are within treatment parameters: Yes K+ 3.3-may proceed. Will start oral KCl  Treatment plan has been signed: Yes   Per physician team, Patient is ready for treatment. Please note the following modifications:  Dose reduction of irinotecan  and requests flu shot today

## 2024-05-13 LAB — CANCER ANTIGEN 19-9: CA 19-9: 34945 U/mL — ABNORMAL HIGH (ref 0–35)

## 2024-05-14 ENCOUNTER — Other Ambulatory Visit: Payer: Self-pay | Admitting: *Deleted

## 2024-05-14 ENCOUNTER — Encounter: Payer: Self-pay | Admitting: *Deleted

## 2024-05-14 ENCOUNTER — Encounter: Payer: Self-pay | Admitting: Oncology

## 2024-05-14 ENCOUNTER — Inpatient Hospital Stay

## 2024-05-14 ENCOUNTER — Other Ambulatory Visit (HOSPITAL_BASED_OUTPATIENT_CLINIC_OR_DEPARTMENT_OTHER): Payer: Self-pay

## 2024-05-14 VITALS — BP 118/83 | HR 65 | Temp 98.3°F | Resp 18

## 2024-05-14 DIAGNOSIS — C259 Malignant neoplasm of pancreas, unspecified: Secondary | ICD-10-CM

## 2024-05-14 DIAGNOSIS — Z5111 Encounter for antineoplastic chemotherapy: Secondary | ICD-10-CM | POA: Diagnosis not present

## 2024-05-14 MED ORDER — AMLODIPINE BESYLATE 10 MG PO TABS
10.0000 mg | ORAL_TABLET | Freq: Every day | ORAL | 5 refills | Status: DC
  Filled 2024-05-14: qty 30, 30d supply, fill #0

## 2024-05-14 NOTE — Progress Notes (Signed)
 Received fax from [facility]. Order for testing received and are working on getting tissue now.

## 2024-05-14 NOTE — Patient Instructions (Signed)

## 2024-05-23 ENCOUNTER — Telehealth: Payer: Self-pay | Admitting: Genetic Counselor

## 2024-05-23 NOTE — Telephone Encounter (Signed)
 Called patient to confirm they are still interested in genetic counseling scheduled for 12/8, given that patient has already had negative genetic testing ordered by his oncologist through the point of care program. Douglas Harper requested to cancel genetic counseling appointment.

## 2024-05-25 ENCOUNTER — Other Ambulatory Visit: Payer: Self-pay | Admitting: Oncology

## 2024-05-25 DIAGNOSIS — C259 Malignant neoplasm of pancreas, unspecified: Secondary | ICD-10-CM

## 2024-05-26 ENCOUNTER — Inpatient Hospital Stay

## 2024-05-26 ENCOUNTER — Inpatient Hospital Stay: Attending: Nurse Practitioner

## 2024-05-26 ENCOUNTER — Inpatient Hospital Stay: Admitting: Genetic Counselor

## 2024-05-26 ENCOUNTER — Inpatient Hospital Stay: Admitting: Nurse Practitioner

## 2024-05-26 ENCOUNTER — Encounter: Payer: Self-pay | Admitting: Nurse Practitioner

## 2024-05-26 ENCOUNTER — Inpatient Hospital Stay: Admitting: Nutrition

## 2024-05-26 VITALS — BP 139/88 | HR 67 | Resp 18

## 2024-05-26 VITALS — BP 140/90 | HR 79 | Temp 97.8°F | Resp 18 | Ht 76.0 in | Wt 200.8 lb

## 2024-05-26 DIAGNOSIS — C250 Malignant neoplasm of head of pancreas: Secondary | ICD-10-CM | POA: Diagnosis not present

## 2024-05-26 DIAGNOSIS — Z800 Family history of malignant neoplasm of digestive organs: Secondary | ICD-10-CM | POA: Diagnosis not present

## 2024-05-26 DIAGNOSIS — I10 Essential (primary) hypertension: Secondary | ICD-10-CM | POA: Insufficient documentation

## 2024-05-26 DIAGNOSIS — R978 Other abnormal tumor markers: Secondary | ICD-10-CM | POA: Insufficient documentation

## 2024-05-26 DIAGNOSIS — C259 Malignant neoplasm of pancreas, unspecified: Secondary | ICD-10-CM

## 2024-05-26 DIAGNOSIS — D709 Neutropenia, unspecified: Secondary | ICD-10-CM | POA: Insufficient documentation

## 2024-05-26 DIAGNOSIS — E039 Hypothyroidism, unspecified: Secondary | ICD-10-CM | POA: Insufficient documentation

## 2024-05-26 DIAGNOSIS — H409 Unspecified glaucoma: Secondary | ICD-10-CM | POA: Insufficient documentation

## 2024-05-26 DIAGNOSIS — G893 Neoplasm related pain (acute) (chronic): Secondary | ICD-10-CM | POA: Insufficient documentation

## 2024-05-26 DIAGNOSIS — K219 Gastro-esophageal reflux disease without esophagitis: Secondary | ICD-10-CM | POA: Insufficient documentation

## 2024-05-26 DIAGNOSIS — Z5111 Encounter for antineoplastic chemotherapy: Secondary | ICD-10-CM | POA: Diagnosis present

## 2024-05-26 DIAGNOSIS — D649 Anemia, unspecified: Secondary | ICD-10-CM | POA: Insufficient documentation

## 2024-05-26 DIAGNOSIS — R748 Abnormal levels of other serum enzymes: Secondary | ICD-10-CM | POA: Diagnosis not present

## 2024-05-26 DIAGNOSIS — C787 Secondary malignant neoplasm of liver and intrahepatic bile duct: Secondary | ICD-10-CM | POA: Insufficient documentation

## 2024-05-26 LAB — CBC WITH DIFFERENTIAL (CANCER CENTER ONLY)
Abs Immature Granulocytes: 0.01 K/uL (ref 0.00–0.07)
Basophils Absolute: 0 K/uL (ref 0.0–0.1)
Basophils Relative: 1 %
Eosinophils Absolute: 0.2 K/uL (ref 0.0–0.5)
Eosinophils Relative: 4 %
HCT: 30.2 % — ABNORMAL LOW (ref 39.0–52.0)
Hemoglobin: 9.8 g/dL — ABNORMAL LOW (ref 13.0–17.0)
Immature Granulocytes: 0 %
Lymphocytes Relative: 31 %
Lymphs Abs: 1.2 K/uL (ref 0.7–4.0)
MCH: 20.2 pg — ABNORMAL LOW (ref 26.0–34.0)
MCHC: 32.5 g/dL (ref 30.0–36.0)
MCV: 62.1 fL — ABNORMAL LOW (ref 80.0–100.0)
Monocytes Absolute: 0.4 K/uL (ref 0.1–1.0)
Monocytes Relative: 10 %
Neutro Abs: 2.1 K/uL (ref 1.7–7.7)
Neutrophils Relative %: 54 %
Platelet Count: 130 K/uL — ABNORMAL LOW (ref 150–400)
RBC: 4.86 MIL/uL (ref 4.22–5.81)
RDW: 18.9 % — ABNORMAL HIGH (ref 11.5–15.5)
WBC Count: 4 K/uL (ref 4.0–10.5)
nRBC: 0 % (ref 0.0–0.2)

## 2024-05-26 LAB — CMP (CANCER CENTER ONLY)
ALT: 51 U/L — ABNORMAL HIGH (ref 0–44)
AST: 38 U/L (ref 15–41)
Albumin: 4.1 g/dL (ref 3.5–5.0)
Alkaline Phosphatase: 182 U/L — ABNORMAL HIGH (ref 38–126)
Anion gap: 11 (ref 5–15)
BUN: 15 mg/dL (ref 6–20)
CO2: 25 mmol/L (ref 22–32)
Calcium: 9.8 mg/dL (ref 8.9–10.3)
Chloride: 103 mmol/L (ref 98–111)
Creatinine: 1.05 mg/dL (ref 0.61–1.24)
GFR, Estimated: >60 mL/min (ref >60)
Glucose, Bld: 193 mg/dL — ABNORMAL HIGH (ref 70–99)
Potassium: 3.4 mmol/L — ABNORMAL LOW (ref 3.5–5.1)
Sodium: 139 mmol/L (ref 135–145)
Total Bilirubin: 1.4 mg/dL — ABNORMAL HIGH (ref 0.0–1.2)
Total Protein: 7.7 g/dL (ref 6.5–8.1)

## 2024-05-26 MED ORDER — OXALIPLATIN CHEMO INJECTION 100 MG/20ML
85.0000 mg/m2 | Freq: Once | INTRAVENOUS | Status: AC
  Administered 2024-05-26: 200 mg via INTRAVENOUS
  Filled 2024-05-26: qty 40

## 2024-05-26 MED ORDER — SODIUM CHLORIDE 0.9 % IV SOLN: INTRAVENOUS | Status: DC

## 2024-05-26 MED ORDER — DEXAMETHASONE SOD PHOSPHATE PF 10 MG/ML IJ SOLN
10.0000 mg | Freq: Once | INTRAMUSCULAR | Status: AC
  Administered 2024-05-26: 10 mg via INTRAVENOUS

## 2024-05-26 MED ORDER — SODIUM CHLORIDE 0.9 % IV SOLN
200.0000 mg/m2 | Freq: Once | INTRAVENOUS | Status: AC
  Administered 2024-05-26: 436 mg via INTRAVENOUS
  Filled 2024-05-26: qty 21.8

## 2024-05-26 MED ORDER — ATROPINE SULFATE 1 MG/ML IV SOLN
0.5000 mg | Freq: Once | INTRAVENOUS | Status: AC | PRN
  Administered 2024-05-26: 0.5 mg via INTRAVENOUS
  Filled 2024-05-26: qty 1

## 2024-05-26 MED ORDER — SODIUM CHLORIDE 0.9 % IV SOLN
1800.0000 mg/m2 | INTRAVENOUS | Status: DC
  Administered 2024-05-26: 3900 mg via INTRAVENOUS
  Filled 2024-05-26: qty 78

## 2024-05-26 MED ORDER — PALONOSETRON HCL INJECTION 0.25 MG/5ML
0.2500 mg | Freq: Once | INTRAVENOUS | Status: AC
  Administered 2024-05-26: 0.25 mg via INTRAVENOUS
  Filled 2024-05-26: qty 5

## 2024-05-26 MED ORDER — SODIUM CHLORIDE 0.9 % IV SOLN
150.0000 mg | Freq: Once | INTRAVENOUS | Status: AC
  Administered 2024-05-26: 150 mg via INTRAVENOUS
  Filled 2024-05-26: qty 150

## 2024-05-26 MED ORDER — SODIUM CHLORIDE 0.9 % IV SOLN
100.0000 mg/m2 | Freq: Once | INTRAVENOUS | Status: AC
  Administered 2024-05-26: 200 mg via INTRAVENOUS
  Filled 2024-05-26: qty 10

## 2024-05-26 MED ORDER — PROCHLORPERAZINE MALEATE 10 MG PO TABS
10.0000 mg | ORAL_TABLET | Freq: Once | ORAL | Status: AC
  Administered 2024-05-26: 10 mg via ORAL
  Filled 2024-05-26: qty 1

## 2024-05-26 MED ORDER — DEXTROSE 5 % IV SOLN: INTRAVENOUS | Status: DC

## 2024-05-26 NOTE — Patient Instructions (Signed)
 [facility] - A DEPT OF [facility]  Discharge Instructions: Thank you for choosing [facility] to provide your oncology and hematology care.   If you have a lab appointment with the Cancer Center, please go directly to the Cancer Center and check in at the registration area.   Wear comfortable clothing and clothing appropriate for easy access to any Portacath or PICC line.   We strive to give you quality time with your provider. You may need to reschedule your appointment if you arrive late (15 or more minutes).  Arriving late affects you and other patients whose appointments are after yours.  Also, if you miss three or more appointments without notifying the office, you may be dismissed from the clinic at the provider's discretion.      For prescription refill requests, have your pharmacy contact our office and allow 72 hours for refills to be completed.    Today you received the following chemotherapy and/or immunotherapy agents: oxaliplatin , irinotecan , leucovirin, fluorouracil .      To help prevent nausea and vomiting after your treatment, we encourage you to take your nausea medication as directed.  BELOW ARE SYMPTOMS THAT SHOULD BE REPORTED IMMEDIATELY: *FEVER GREATER THAN 100.4 F (38 C) OR HIGHER *CHILLS OR SWEATING *NAUSEA AND VOMITING THAT IS NOT CONTROLLED WITH YOUR NAUSEA MEDICATION *UNUSUAL SHORTNESS OF BREATH *UNUSUAL BRUISING OR BLEEDING *URINARY PROBLEMS (pain or burning when urinating, or frequent urination) *BOWEL PROBLEMS (unusual diarrhea, constipation, pain near the anus) TENDERNESS IN MOUTH AND THROAT WITH OR WITHOUT PRESENCE OF ULCERS (sore throat, sores in mouth, or a toothache) UNUSUAL RASH, SWELLING OR PAIN  UNUSUAL VAGINAL DISCHARGE OR ITCHING   Items with * indicate a potential emergency and should be followed up as soon as possible or go to the Emergency Department if any problems should occur.  Please show the  CHEMOTHERAPY ALERT CARD or IMMUNOTHERAPY ALERT CARD at check-in to the Emergency Department and triage nurse.  Should you have questions after your visit or need to cancel or reschedule your appointment, please contact [facility] - A DEPT OF [facility]  Dept: [phone]  and follow the prompts.  Office hours are 8:00 a.m. to 4:30 p.m. Monday - Friday. Please note that voicemails left after 4:00 p.m. may not be returned until the following business day.  We are closed weekends and major holidays. You have access to a nurse at all times for urgent questions. Please call the main number to the clinic Dept: [phone] and follow the prompts.   For any non-urgent questions, you may also contact your provider using MyChart. We now offer e-Visits for anyone 23 and older to request care online for non-urgent symptoms. For details visit [email or web address].   Also download the MyChart app! Go to the app store, search MyChart, open the app, select [place], and log in with your MyChart username and password. ___________________________________________ Oxaliplatin  Injection What is this medication? OXALIPLATIN  (ox AL i PLA tin) treats colorectal cancer. It works by slowing down the growth of cancer cells. This medicine may be used for other purposes; ask your health care provider or pharmacist if you have questions. COMMON BRAND NAME(S): Eloxatin  What should I tell my care team before I take this medication? They need to know if you have any of these conditions: Heart disease History of irregular heartbeat or rhythm Liver disease Low blood cell levels (white cells, red cells, and platelets) Lung or  breathing disease, such as asthma Take medications that treat or prevent blood clots Tingling of the fingers, toes, or other nerve disorder An unusual or allergic reaction to oxaliplatin , other medications, foods, dyes, or preservatives If you or your partner are  pregnant or trying to get pregnant Breast-feeding How should I use this medication? This medication is injected into a vein. It is given by your care team in a hospital or clinic setting. Talk to your care team about the use of this medication in children. Special care may be needed. Overdosage: If you think you have taken too much of this medicine contact a poison control center or emergency room at once. NOTE: This medicine is only for you. Do not share this medicine with others. What if I miss a dose? Keep appointments for follow-up doses. It is important not to miss a dose. Call your care team if you are unable to keep an appointment. What may interact with this medication? Do not take this medication with any of the following: Cisapride Dronedarone Pimozide Thioridazine This medication may also interact with the following: Aspirin and aspirin-like medications Certain medications that treat or prevent blood clots, such as warfarin, apixaban, dabigatran, and rivaroxaban Cisplatin Cyclosporine Diuretics Medications for infection, such as acyclovir, adefovir, amphotericin B, bacitracin, cidofovir, foscarnet, ganciclovir, gentamicin, pentamidine, vancomycin NSAIDs, medications for pain and inflammation, such as ibuprofen  or naproxen Other medications that cause heart rhythm changes Pamidronate Zoledronic acid This list may not describe all possible interactions. Give your health care provider a list of all the medicines, herbs, non-prescription drugs, or dietary supplements you use. Also tell them if you smoke, drink alcohol, or use illegal drugs. Some items may interact with your medicine. What should I watch for while using this medication? Your condition will be monitored carefully while you are receiving this medication. You may need blood work while taking this medication. This medication may make you feel generally unwell. This is not uncommon as chemotherapy can affect healthy  cells as well as cancer cells. Report any side effects. Continue your course of treatment even though you feel ill unless your care team tells you to stop. This medication may increase your risk of getting an infection. Call your care team for advice if you get a fever, chills, sore throat, or other symptoms of a cold or flu. Do not treat yourself. Try to avoid being around people who are sick. Avoid taking medications that contain aspirin, acetaminophen , ibuprofen , naproxen, or ketoprofen unless instructed by your care team. These medications may hide a fever. Be careful brushing or flossing your teeth or using a toothpick because you may get an infection or bleed more easily. If you have any dental work done, tell your dentist you are receiving this medication. This medication can make you more sensitive to cold. Do not drink cold drinks or use ice. Cover exposed skin before coming in contact with cold temperatures or cold objects. When out in cold weather wear warm clothing and cover your mouth and nose to warm the air that goes into your lungs. Tell your care team if you get sensitive to the cold. Talk to your care team if you or your partner are pregnant or think either of you might be pregnant. This medication can cause serious birth defects if taken during pregnancy and for 9 months after the last dose. A negative pregnancy test is required before starting this medication. A reliable form of contraception is recommended while taking this medication and for 9  months after the last dose. Talk to your care team about effective forms of contraception. Do not father a child while taking this medication and for 6 months after the last dose. Use a condom while having sex during this time period. Do not breastfeed while taking this medication and for 3 months after the last dose. This medication may cause infertility. Talk to your care team if you are concerned about your fertility. What side effects may I  notice from receiving this medication? Side effects that you should report to your care team as soon as possible: Allergic reactions--skin rash, itching, hives, swelling of the face, lips, tongue, or throat Bleeding--bloody or black, tar-like stools, vomiting blood or brown material that looks like coffee grounds, red or dark brown urine, small red or purple spots on skin, unusual bruising or bleeding Dry cough, shortness of breath or trouble breathing Heart rhythm changes--fast or irregular heartbeat, dizziness, feeling faint or lightheaded, chest pain, trouble breathing Infection--fever, chills, cough, sore throat, wounds that don't heal, pain or trouble when passing urine, general feeling of discomfort or being unwell Liver injury--right upper belly pain, loss of appetite, nausea, light-colored stool, dark yellow or brown urine, yellowing skin or eyes, unusual weakness or fatigue Low red blood cell level--unusual weakness or fatigue, dizziness, headache, trouble breathing Muscle injury--unusual weakness or fatigue, muscle pain, dark yellow or brown urine, decrease in amount of urine Pain, tingling, or numbness in the hands or feet Sudden and severe headache, confusion, change in vision, seizures, which may be signs of posterior reversible encephalopathy syndrome (PRES) Unusual bruising or bleeding Side effects that usually do not require medical attention (report to your care team if they continue or are bothersome): Diarrhea Nausea Pain, redness, or swelling with sores inside the mouth or throat Unusual weakness or fatigue Vomiting This list may not describe all possible side effects. Call your doctor for medical advice about side effects. You may report side effects to FDA at 1-800-FDA-1088. Where should I keep my medication? This medication is given in a hospital or clinic. It will not be stored at home. NOTE: This sheet is a summary. It may not cover all possible information. If you have  questions about this medicine, talk to your doctor, pharmacist, or health care provider.  2024 Elsevier/Gold Standard (2023-05-18 00:00:00) _________________________________ Leucovorin  Injection What is this medication? LEUCOVORIN  (loo koe VOR in) prevents side effects from certain medications, such as methotrexate. It works by increasing folate levels. This helps protect healthy cells in your body. It may also be used to treat anemia caused by low levels of folate. It can also be used with fluorouracil , a type of chemotherapy, to treat colorectal cancer. It works by increasing the effects of fluorouracil  in the body. This medicine may be used for other purposes; ask your health care provider or pharmacist if you have questions. What should I tell my care team before I take this medication? They need to know if you have any of these conditions: Anemia from low levels of vitamin B12 in the blood An unusual or allergic reaction to leucovorin , folic acid, other medications, foods, dyes, or preservatives Pregnant or trying to get pregnant Breastfeeding How should I use this medication? This medication is injected into a vein or a muscle. It is given by your care team in a hospital or clinic setting. Talk to your care team about the use of this medication in children. Special care may be needed. Overdosage: If you think you have taken too  much of this medicine contact a poison control center or emergency room at once. NOTE: This medicine is only for you. Do not share this medicine with others. What if I miss a dose? Keep appointments for follow-up doses. It is important not to miss your dose. Call your care team if you are unable to keep an appointment. What may interact with this medication? Capecitabine Fluorouracil  Phenobarbital Phenytoin Primidone Trimethoprim;sulfamethoxazole This list may not describe all possible interactions. Give your health care provider a list of all the medicines,  herbs, non-prescription drugs, or dietary supplements you use. Also tell them if you smoke, drink alcohol, or use illegal drugs. Some items may interact with your medicine. What should I watch for while using this medication? Your condition will be monitored carefully while you are receiving this medication. This medication may increase the side effects of 5-fluorouracil . Tell your care team if you have diarrhea or mouth sores that do not get better or that get worse. What side effects may I notice from receiving this medication? Side effects that you should report to your care team as soon as possible: Allergic reactions--skin rash, itching, hives, swelling of the face, lips, tongue, or throat This list may not describe all possible side effects. Call your doctor for medical advice about side effects. You may report side effects to FDA at 1-800-FDA-1088. Where should I keep my medication? This medication is given in a hospital or clinic. It will not be stored at home. NOTE: This sheet is a summary. It may not cover all possible information. If you have questions about this medicine, talk to your doctor, pharmacist, or health care provider.  2024 Elsevier/Gold Standard (2021-11-08 00:00:00) ________________________________ Fluorouracil  Injection What is this medication? FLUOROURACIL  (flure oh YOOR a sil) treats some types of cancer. It works by slowing down the growth of cancer cells. This medicine may be used for other purposes; ask your health care provider or pharmacist if you have questions. COMMON BRAND NAME(S): Adrucil  What should I tell my care team before I take this medication? They need to know if you have any of these conditions: Blood disorders Dihydropyrimidine dehydrogenase (DPD) deficiency Infection, such as chickenpox, cold sores, herpes Kidney disease Liver disease Poor nutrition Recent or ongoing radiation therapy An unusual or allergic reaction to fluorouracil , other  medications, foods, dyes, or preservatives If you or your partner are pregnant or trying to get pregnant Breast-feeding How should I use this medication? This medication is injected into a vein. It is administered by your care team in a hospital or clinic setting. Talk to your care team about the use of this medication in children. Special care may be needed. Overdosage: If you think you have taken too much of this medicine contact a poison control center or emergency room at once. NOTE: This medicine is only for you. Do not share this medicine with others. What if I miss a dose? Keep appointments for follow-up doses. It is important not to miss your dose. Call your care team if you are unable to keep an appointment. What may interact with this medication? Do not take this medication with any of the following: Live virus vaccines This medication may also interact with the following: Medications that treat or prevent blood clots, such as warfarin, enoxaparin , dalteparin This list may not describe all possible interactions. Give your health care provider a list of all the medicines, herbs, non-prescription drugs, or dietary supplements you use. Also tell them if you smoke, drink alcohol, or  use illegal drugs. Some items may interact with your medicine. What should I watch for while using this medication? Your condition will be monitored carefully while you are receiving this medication. This medication may make you feel generally unwell. This is not uncommon as chemotherapy can affect healthy cells as well as cancer cells. Report any side effects. Continue your course of treatment even though you feel ill unless your care team tells you to stop. In some cases, you may be given additional medications to help with side effects. Follow all directions for their use. This medication may increase your risk of getting an infection. Call your care team for advice if you get a fever, chills, sore throat, or  other symptoms of a cold or flu. Do not treat yourself. Try to avoid being around people who are sick. This medication may increase your risk to bruise or bleed. Call your care team if you notice any unusual bleeding. Be careful brushing or flossing your teeth or using a toothpick because you may get an infection or bleed more easily. If you have any dental work done, tell your dentist you are receiving this medication. Avoid taking medications that contain aspirin, acetaminophen , ibuprofen , naproxen, or ketoprofen unless instructed by your care team. These medications may hide a fever. Do not treat diarrhea with over the counter products. Contact your care team if you have diarrhea that lasts more than 2 days or if it is severe and watery. This medication can make you more sensitive to the sun. Keep out of the sun. If you cannot avoid being in the sun, wear protective clothing and sunscreen. Do not use sun lamps, tanning beds, or tanning booths. Talk to your care team if you or your partner wish to become pregnant or think you might be pregnant. This medication can cause serious birth defects if taken during pregnancy and for 3 months after the last dose. A reliable form of contraception is recommended while taking this medication and for 3 months after the last dose. Talk to your care team about effective forms of contraception. Do not father a child while taking this medication and for 3 months after the last dose. Use a condom while having sex during this time period. Do not breastfeed while taking this medication. This medication may cause infertility. Talk to your care team if you are concerned about your fertility. What side effects may I notice from receiving this medication? Side effects that you should report to your care team as soon as possible: Allergic reactions--skin rash, itching, hives, swelling of the face, lips, tongue, or throat Heart attack--pain or tightness in the chest, shoulders,  arms, or jaw, nausea, shortness of breath, cold or clammy skin, feeling faint or lightheaded Heart failure--shortness of breath, swelling of the ankles, feet, or hands, sudden weight gain, unusual weakness or fatigue Heart rhythm changes--fast or irregular heartbeat, dizziness, feeling faint or lightheaded, chest pain, trouble breathing High ammonia level--unusual weakness or fatigue, confusion, loss of appetite, nausea, vomiting, seizures Infection--fever, chills, cough, sore throat, wounds that don't heal, pain or trouble when passing urine, general feeling of discomfort or being unwell Low red blood cell level--unusual weakness or fatigue, dizziness, headache, trouble breathing Pain, tingling, or numbness in the hands or feet, muscle weakness, change in vision, confusion or trouble speaking, loss of balance or coordination, trouble walking, seizures Redness, swelling, and blistering of the skin over hands and feet Severe or prolonged diarrhea Unusual bruising or bleeding Side effects that usually do not  require medical attention (report to your care team if they continue or are bothersome): Dry skin Headache Increased tears Nausea Pain, redness, or swelling with sores inside the mouth or throat Sensitivity to light Vomiting This list may not describe all possible side effects. Call your doctor for medical advice about side effects. You may report side effects to FDA at 1-800-FDA-1088. Where should I keep my medication? This medication is given in a hospital or clinic. It will not be stored at home. NOTE: This sheet is a summary. It may not cover all possible information. If you have questions about this medicine, talk to your doctor, pharmacist, or health care provider.  2024 Elsevier/Gold Standard (2021-10-11 00:00:00)

## 2024-05-26 NOTE — Progress Notes (Signed)
 [facility] OFFICE PROGRESS NOTE   Diagnosis: Pancreas cancer  INTERVAL HISTORY:   Douglas Harper returns as scheduled.  He completed a cycle of FOLFIRINOX 05/12/2024.  He denies nausea/vomiting.  No mouth sores.  No diarrhea.  Cold sensitivity lasted about 5 days.  No persistent neuropathy symptoms.  He notes being unable to sleep on either side due to pain.  He is able to sleep on his back without difficulty.  Objective:  Vital signs in last 24 hours:  Blood pressure (!) 140/90, pulse 79, temperature 97.8 F (36.6 C), temperature source Temporal, resp. rate 18, height 6' 4 (1.93 m), weight 200 lb 12.8 oz (91.1 kg), SpO2 100%.    HEENT: No thrush or ulcers. Resp: Lungs clear bilaterally. Cardio: Regular rate and rhythm. GI: No hepatosplenomegaly.  Nontender. Vascular: No leg edema. Neuro: Vibratory sense intact over the fingertips per tuning fork exam. Skin: Palms without erythema. Port-A-Cath without erythema.  Lab Results:  Lab Results  Component Value Date   WBC 5.1 05/12/2024   HGB 9.9 (L) 05/12/2024   HCT 31.0 (L) 05/12/2024   MCV 61.8 (L) 05/12/2024   PLT 154 05/12/2024   NEUTROABS 3.4 05/12/2024    Imaging:  No results found.  Medications: I have reviewed the patient's current medications.  Assessment/Plan: Pancreas cancer, stage IV (cT1c, cN0, pM1) 03/09/2024 CTs chest, abdomen, and pelvis: 1.6 x 1.9 cm pancreas head mass, upstream pancreatic duct dilation, multiple hypoattenuating liver lesions, fat stranding surrounding the pancreas head and proximal duodenum, no metastatic disease in the chest Ultrasound-guided biopsy of left liver mass 03/11/2024: Invasive moderately differentiated pancreatic adenocarcinoma, pancreas with severe ductal dysplasia (PanIN-3), no hepatic parenchyma is present.  MSS, HRD signature negative, tumor mutation burden 0, KRAS G 12R, Elevated CA 19-9 Elevated liver enzymes and bilirubin Progressive rise in liver enzymes  and bilirubin 03/24/2024 CTs 03/24/2024-increase in mild to moderate biliary duct dilatation; pancreatic head/uncinate process mass and liver metastasis relatively similar 03/25/2024: Biliary stricture with upstream dilation, head of pancreas with double duct sign uncovered metal stent 03/26/2024 ultrasound biopsy of right liver lesion: Metastatic moderately differentiated adenocarcinoma consistent with a pancreas primary 03/27/2024 CT abdomen/pelvis: Interval placement of a biliary stent extending into the cystic duct, not clearly communicating with the common duct occiput to the obstructing mass.,  Stable biliary duct dilation, stable hepatic metastases and pancreas head mass, development of mild peripancreatic inflammatory stranding Cycle 1 FOLFOX 04/15/2024 Cycle 2 FOLFOX 04/28/2024 Cycle 3 FOLFIRINOX 05/12/2024, irinotecan  dose reduced secondary to elevated bilirubin and liver enzymes Cycle 4 FOLFIRINOX 05/26/2024, no dose changes Pain secondary to #1 Hypothyroidism Family history of colon cancer Microcytic anemia 03/24/2024 Hemoglobin electrophoresis: 94.6% A, 5.4% A2 Hyperthyroidism status post RAI in 2009 Gastroesophageal reflux disease Glaucoma Hypertension Hospital admission 03/24/2024 with obstructive jaundice, status post common duct stent placement Hospital admission 03/27/2024 with post ERCP pancreatitis, fever migration of bile duct stent to the cystic duct, increased abdominal pain; 03/28/2024 ERCP with metal stent removal followed by placement of self-expanding metal stent      Disposition: Douglas Harper appears stable.  He has completed 2 cycles of FOLFOX and most recently a cycle of FOLFIRINOX.  He tolerated the cycle of FOLFIRINOX well.  Plan to proceed with treatment today as scheduled.  CBC and chemistry panel reviewed.  Labs are adequate to proceed with treatment.  Bilirubin is lower.  He has mild thrombocytopenia.  He understands to contact the office with bleeding.  He will  return for follow-up and the  fifth cycle of chemotherapy in 2 weeks.  We are available to see him sooner if needed.    Olam Ned ANP/GNP-BC   05/26/2024  8:50 AM

## 2024-05-26 NOTE — Progress Notes (Signed)
 C/O abdominal discomfort at 1415, reports it feel like his usual nausea \\at  home and is relieved by compazine . Obtained order for compazine  from Olam Ned, NP (see MAR). Pt reports resolution of abd discomfort at 1510. He did receive atropine  prior to his irinotecan  infusion starting and said this was unlike his previous abd cramping and flushing with irinotecan .

## 2024-05-26 NOTE — Progress Notes (Signed)
 Patient seen by Olam Ned NP today  Vitals are within treatment parameters:Yes   Labs are within treatment parameters: Yes   Treatment plan has been signed: Yes   Per physician team, Patient is ready for treatment and there are NO modifications to the treatment plan.

## 2024-05-26 NOTE — Patient Instructions (Signed)

## 2024-05-26 NOTE — Progress Notes (Signed)
 Patient wanted to check in today during infusion.  Patient receiving FOLFIRINOX for Pancreas cancer and followed by Dr. Cloretta.   Weight improved to 200.8 pounds today from 197 pounds November 24.  Labs include potassium 3.4, Glucose 193  Continues to feel well. No nausea, vomiting, constipation or diarrhea. His appetite is good. He tried a museum/gallery exhibitions officer and could only eat half. Reports it started to bother him. Sits on (his) stomach. Wants to try pizza and spaghetti with marinara sauce. Understands increased acid could be bothersome so agrees to go slow. He only has stomach pain if he is hungry and hasn't eaten. Once he eats, he feels better.  Nutrition Diagnosis: Food and Nutrition Related Knowledge Deficit improved.   Intervention: Educated on continuing small, frequent meals and snacks for weight maintenance. Add foods as tolerated. Diet as tolerated.   Monitoring, evaluation, Goals: Tolerate increased calories and protein to minimize weight loss.   Next Visit: Monday, Dec 22, during infusion.

## 2024-05-27 ENCOUNTER — Other Ambulatory Visit: Payer: Self-pay

## 2024-05-27 LAB — CANCER ANTIGEN 19-9: CA 19-9: 24456 U/mL — ABNORMAL HIGH (ref 0–35)

## 2024-05-28 ENCOUNTER — Inpatient Hospital Stay

## 2024-05-28 ENCOUNTER — Encounter (HOSPITAL_COMMUNITY): Payer: Self-pay

## 2024-05-28 ENCOUNTER — Other Ambulatory Visit: Payer: Self-pay

## 2024-05-28 VITALS — BP 121/86 | HR 74 | Temp 98.1°F | Resp 16

## 2024-05-28 DIAGNOSIS — C259 Malignant neoplasm of pancreas, unspecified: Secondary | ICD-10-CM

## 2024-05-28 DIAGNOSIS — Z5111 Encounter for antineoplastic chemotherapy: Secondary | ICD-10-CM | POA: Diagnosis not present

## 2024-05-28 NOTE — Patient Instructions (Signed)

## 2024-06-08 ENCOUNTER — Other Ambulatory Visit: Payer: Self-pay | Admitting: Oncology

## 2024-06-08 DIAGNOSIS — C259 Malignant neoplasm of pancreas, unspecified: Secondary | ICD-10-CM

## 2024-06-09 ENCOUNTER — Inpatient Hospital Stay: Admitting: Nutrition

## 2024-06-09 ENCOUNTER — Other Ambulatory Visit: Payer: Self-pay

## 2024-06-09 ENCOUNTER — Encounter: Payer: Self-pay | Admitting: Nurse Practitioner

## 2024-06-09 ENCOUNTER — Inpatient Hospital Stay (HOSPITAL_BASED_OUTPATIENT_CLINIC_OR_DEPARTMENT_OTHER): Admitting: Nurse Practitioner

## 2024-06-09 ENCOUNTER — Inpatient Hospital Stay

## 2024-06-09 ENCOUNTER — Other Ambulatory Visit (HOSPITAL_BASED_OUTPATIENT_CLINIC_OR_DEPARTMENT_OTHER): Payer: Self-pay

## 2024-06-09 VITALS — BP 131/81 | HR 71 | Temp 97.9°F | Resp 17

## 2024-06-09 VITALS — BP 125/86 | HR 76 | Temp 98.1°F | Resp 18 | Ht 76.0 in | Wt 203.1 lb

## 2024-06-09 DIAGNOSIS — C259 Malignant neoplasm of pancreas, unspecified: Secondary | ICD-10-CM

## 2024-06-09 DIAGNOSIS — Z5111 Encounter for antineoplastic chemotherapy: Secondary | ICD-10-CM | POA: Diagnosis not present

## 2024-06-09 LAB — CBC WITH DIFFERENTIAL (CANCER CENTER ONLY)
Abs Immature Granulocytes: 0.01 K/uL (ref 0.00–0.07)
Basophils Absolute: 0 K/uL (ref 0.0–0.1)
Basophils Relative: 1 %
Eosinophils Absolute: 0.1 K/uL (ref 0.0–0.5)
Eosinophils Relative: 3 %
HCT: 31.7 % — ABNORMAL LOW (ref 39.0–52.0)
Hemoglobin: 10.1 g/dL — ABNORMAL LOW (ref 13.0–17.0)
Immature Granulocytes: 0 %
Lymphocytes Relative: 35 %
Lymphs Abs: 1.2 K/uL (ref 0.7–4.0)
MCH: 20.2 pg — ABNORMAL LOW (ref 26.0–34.0)
MCHC: 31.9 g/dL (ref 30.0–36.0)
MCV: 63.5 fL — ABNORMAL LOW (ref 80.0–100.0)
Monocytes Absolute: 0.5 K/uL (ref 0.1–1.0)
Monocytes Relative: 14 %
Neutro Abs: 1.6 K/uL — ABNORMAL LOW (ref 1.7–7.7)
Neutrophils Relative %: 47 %
Platelet Count: 154 K/uL (ref 150–400)
RBC: 4.99 MIL/uL (ref 4.22–5.81)
RDW: 20 % — ABNORMAL HIGH (ref 11.5–15.5)
WBC Count: 3.4 K/uL — ABNORMAL LOW (ref 4.0–10.5)
nRBC: 0.6 % — ABNORMAL HIGH (ref 0.0–0.2)

## 2024-06-09 LAB — CMP (CANCER CENTER ONLY)
ALT: 59 U/L — ABNORMAL HIGH (ref 0–44)
AST: 47 U/L — ABNORMAL HIGH (ref 15–41)
Albumin: 4.2 g/dL (ref 3.5–5.0)
Alkaline Phosphatase: 195 U/L — ABNORMAL HIGH (ref 38–126)
Anion gap: 11 (ref 5–15)
BUN: 14 mg/dL (ref 6–20)
CO2: 25 mmol/L (ref 22–32)
Calcium: 9.9 mg/dL (ref 8.9–10.3)
Chloride: 103 mmol/L (ref 98–111)
Creatinine: 1.1 mg/dL (ref 0.61–1.24)
GFR, Estimated: >60 mL/min
Glucose, Bld: 244 mg/dL — ABNORMAL HIGH (ref 70–99)
Potassium: 3.6 mmol/L (ref 3.5–5.1)
Sodium: 139 mmol/L (ref 135–145)
Total Bilirubin: 1 mg/dL (ref 0.0–1.2)
Total Protein: 7.7 g/dL (ref 6.5–8.1)

## 2024-06-09 MED ORDER — OXALIPLATIN CHEMO INJECTION 100 MG/20ML: 85.0000 mg/m2 | Freq: Once | INTRAVENOUS | Status: DC

## 2024-06-09 MED ORDER — DEXAMETHASONE SOD PHOSPHATE PF 10 MG/ML IJ SOLN
10.0000 mg | Freq: Once | INTRAMUSCULAR | Status: AC
  Administered 2024-06-09: 10 mg via INTRAVENOUS

## 2024-06-09 MED ORDER — SODIUM CHLORIDE 0.9 % IV SOLN
150.0000 mg | Freq: Once | INTRAVENOUS | Status: AC
  Administered 2024-06-09: 150 mg via INTRAVENOUS
  Filled 2024-06-09: qty 150

## 2024-06-09 MED ORDER — POTASSIUM CHLORIDE CRYS ER 10 MEQ PO TBCR
10.0000 meq | EXTENDED_RELEASE_TABLET | Freq: Two times a day (BID) | ORAL | 2 refills | Status: AC
  Filled 2024-06-09: qty 60, 30d supply, fill #0
  Filled 2024-07-22: qty 60, 30d supply, fill #1

## 2024-06-09 MED ORDER — SODIUM CHLORIDE 0.9 % IV SOLN
100.0000 mg/m2 | Freq: Once | INTRAVENOUS | Status: AC
  Administered 2024-06-09: 200 mg via INTRAVENOUS
  Filled 2024-06-09: qty 10

## 2024-06-09 MED ORDER — OXALIPLATIN CHEMO INJECTION 100 MG/20ML
65.0000 mg/m2 | Freq: Once | INTRAVENOUS | Status: AC
  Administered 2024-06-09: 150 mg via INTRAVENOUS
  Filled 2024-06-09: qty 20

## 2024-06-09 MED ORDER — AMLODIPINE BESYLATE 10 MG PO TABS
10.0000 mg | ORAL_TABLET | Freq: Every day | ORAL | 5 refills | Status: AC
  Filled 2024-06-09: qty 90, 90d supply, fill #0

## 2024-06-09 MED ORDER — DEXTROSE 5 % IV SOLN: INTRAVENOUS | Status: DC

## 2024-06-09 MED ORDER — SODIUM CHLORIDE 0.9 % IV SOLN
200.0000 mg/m2 | Freq: Once | INTRAVENOUS | Status: AC
  Administered 2024-06-09: 436 mg via INTRAVENOUS
  Filled 2024-06-09: qty 21.8

## 2024-06-09 MED ORDER — PROCHLORPERAZINE MALEATE 10 MG PO TABS
10.0000 mg | ORAL_TABLET | Freq: Four times a day (QID) | ORAL | 3 refills | Status: AC | PRN
  Filled 2024-06-09: qty 30, 8d supply, fill #0

## 2024-06-09 MED ORDER — PALONOSETRON HCL INJECTION 0.25 MG/5ML
0.2500 mg | Freq: Once | INTRAVENOUS | Status: AC
  Administered 2024-06-09: 0.25 mg via INTRAVENOUS
  Filled 2024-06-09: qty 5

## 2024-06-09 MED ORDER — SODIUM CHLORIDE 0.9 % IV SOLN
1800.0000 mg/m2 | INTRAVENOUS | Status: DC
  Administered 2024-06-09: 3900 mg via INTRAVENOUS
  Filled 2024-06-09: qty 78

## 2024-06-09 MED ORDER — ATROPINE SULFATE 1 MG/ML IV SOLN
0.5000 mg | Freq: Once | INTRAVENOUS | Status: AC | PRN
  Administered 2024-06-09: 0.5 mg via INTRAVENOUS
  Filled 2024-06-09: qty 1

## 2024-06-09 NOTE — Patient Instructions (Signed)

## 2024-06-09 NOTE — Progress Notes (Signed)
 Patient seen by Olam Ned NP today  Vitals are within treatment parameters:Yes   Labs are within treatment parameters: No (Please specify and give further instructions.) WBC 3.4 Neut 1.6 its okay to proceed  Treatment plan has been signed: Yes   Per physician team, Patient is ready for treatment. Please note the following modifications: dose reduce of the oxaliplation

## 2024-06-09 NOTE — Patient Instructions (Signed)
 [facility] - A DEPT OF [facility]  Discharge Instructions: Thank you for choosing [facility] to provide your oncology and hematology care.   If you have a lab appointment with the Cancer Center, please go directly to the Cancer Center and check in at the registration area.   Wear comfortable clothing and clothing appropriate for easy access to any Portacath or PICC line.   We strive to give you quality time with your provider. You may need to reschedule your appointment if you arrive late (15 or more minutes).  Arriving late affects you and other patients whose appointments are after yours.  Also, if you miss three or more appointments without notifying the office, you may be dismissed from the clinic at the providers discretion.      For prescription refill requests, have your pharmacy contact our office and allow 72 hours for refills to be completed.    Today you received the following chemotherapy and/or immunotherapy agents: oxaliplatin , leucovorin , irinotecan , fluorouracil        To help prevent nausea and vomiting after your treatment, we encourage you to take your nausea medication as directed.  BELOW ARE SYMPTOMS THAT SHOULD BE REPORTED IMMEDIATELY: *FEVER GREATER THAN 100.4 F (38 C) OR HIGHER *CHILLS OR SWEATING *NAUSEA AND VOMITING THAT IS NOT CONTROLLED WITH YOUR NAUSEA MEDICATION *UNUSUAL SHORTNESS OF BREATH *UNUSUAL BRUISING OR BLEEDING *URINARY PROBLEMS (pain or burning when urinating, or frequent urination) *BOWEL PROBLEMS (unusual diarrhea, constipation, pain near the anus) TENDERNESS IN MOUTH AND THROAT WITH OR WITHOUT PRESENCE OF ULCERS (sore throat, sores in mouth, or a toothache) UNUSUAL RASH, SWELLING OR PAIN  UNUSUAL VAGINAL DISCHARGE OR ITCHING   Items with * indicate a potential emergency and should be followed up as soon as possible or go to the Emergency Department if any problems should occur.  Please show the  CHEMOTHERAPY ALERT CARD or IMMUNOTHERAPY ALERT CARD at check-in to the Emergency Department and triage nurse.  Should you have questions after your visit or need to cancel or reschedule your appointment, please contact [facility] - A DEPT OF [facility]  Dept: [phone]  and follow the prompts.  Office hours are 8:00 a.m. to 4:30 p.m. Monday - Friday. Please note that voicemails left after 4:00 p.m. may not be returned until the following business day.  We are closed weekends and major holidays. You have access to a nurse at all times for urgent questions. Please call the main number to the clinic Dept: [phone] and follow the prompts.   For any non-urgent questions, you may also contact your provider using MyChart. We now offer e-Visits for anyone 56 and older to request care online for non-urgent symptoms. For details visit [email or web address].   Also download the MyChart app! Go to the app store, search MyChart, open the app, select [place], and log in with your MyChart username and password.

## 2024-06-09 NOTE — Progress Notes (Signed)
 " [facility] OFFICE PROGRESS NOTE   Diagnosis: Pancreas cancer  INTERVAL HISTORY:   Douglas Harper returns as scheduled.  He completed another cycle of FOLFIRINOX 05/26/2024.  He denies nausea/vomiting.  No mouth sores.  No diarrhea.  Cold sensitivity lasted 4 to 5 days.  No persistent neuropathy symptoms.  Occasional sharp abdominal pain, resolves spontaneously within seconds.  Objective:  Vital signs in last 24 hours:  Blood pressure 125/86, pulse 76, temperature 98.1 F (36.7 C), temperature source Temporal, resp. rate 18, height 6' 4 (1.93 m), weight 203 lb 1.6 oz (92.1 kg), SpO2 100%.    HEENT: No thrush or ulcers. Resp: Lungs clear bilaterally. Cardio: Regular rate and rhythm. GI: Nontender.  No hepatosplenomegaly. Vascular: No leg edema. Neuro: Vibratory sense intact over the fingertips per tuning fork exam. Skin: Palms without erythema. Port-A-Cath without erythema.  Lab Results:  Lab Results  Component Value Date   WBC 3.4 (L) 06/09/2024   HGB 10.1 (L) 06/09/2024   HCT 31.7 (L) 06/09/2024   MCV 63.5 (L) 06/09/2024   PLT 154 06/09/2024   NEUTROABS 1.6 (L) 06/09/2024    Imaging:  No results found.  Medications: I have reviewed the patient's current medications.  Assessment/Plan: Pancreas cancer, stage IV (cT1c, cN0, pM1) 03/09/2024 CTs chest, abdomen, and pelvis: 1.6 x 1.9 cm pancreas head mass, upstream pancreatic duct dilation, multiple hypoattenuating liver lesions, fat stranding surrounding the pancreas head and proximal duodenum, no metastatic disease in the chest Ultrasound-guided biopsy of left liver mass 03/11/2024: Invasive moderately differentiated pancreatic adenocarcinoma, pancreas with severe ductal dysplasia (PanIN-3), no hepatic parenchyma is present.  MSS, HRD signature negative, tumor mutation burden 0, KRAS G 12R Elevated CA 19-9 Elevated liver enzymes and bilirubin Progressive rise in liver enzymes and bilirubin 03/24/2024 CTs  03/24/2024-increase in mild to moderate biliary duct dilatation; pancreatic head/uncinate process mass and liver metastasis relatively similar 03/25/2024: Biliary stricture with upstream dilation, head of pancreas with double duct sign uncovered metal stent 03/26/2024 ultrasound biopsy of right liver lesion: Metastatic moderately differentiated adenocarcinoma consistent with a pancreas primary; foundation 1-HRDsig positive, microsatellite stable, tumor mutation burden 0, KRAS G 12R, no reportable RNA variants 03/27/2024 CT abdomen/pelvis: Interval placement of a biliary stent extending into the cystic duct, not clearly communicating with the common duct occiput to the obstructing mass.,  Stable biliary duct dilation, stable hepatic metastases and pancreas head mass, development of mild peripancreatic inflammatory stranding Cycle 1 FOLFOX 04/15/2024 Cycle 2 FOLFOX 04/28/2024 Cycle 3 FOLFIRINOX 05/12/2024, irinotecan  dose reduced secondary to elevated bilirubin and liver enzymes Cycle 4 FOLFIRINOX 05/26/2024, no dose changes Cycle 5 FOLFIRINOX 06/09/2024, oxaliplatin  dose reduced due to neutropenia Pain secondary to #1 Hypothyroidism Family history of colon cancer Microcytic anemia 03/24/2024 Hemoglobin electrophoresis: 94.6% A, 5.4% A2 Hyperthyroidism status post RAI in 2009 Gastroesophageal reflux disease Glaucoma Hypertension Hospital admission 03/24/2024 with obstructive jaundice, status post common duct stent placement Hospital admission 03/27/2024 with post ERCP pancreatitis, fever migration of bile duct stent to the cystic duct, increased abdominal pain; 03/28/2024 ERCP with metal stent removal followed by placement of self-expanding metal stent    Disposition: Douglas Harper appears stable.  He has completed 4 cycles of systemic therapy, 2 FOLFOX, 2 FOLFIRINOX.  He continues to tolerate treatment well.  He has mild neutropenia.  We were unable to obtain insurance authorization for white cell growth  factor support on the day of pump discontinuation.  Plan to proceed with cycle 5 today as scheduled.  Oxaliplatin  will be dose  reduced due to mild neutropenia.  He understands to contact the office with fever, chills, other signs of infection.  CBC and chemistry panel reviewed.  Labs are adequate for treatment.  Bilirubin is normal.  We discussed the elevated glucose.  He will avoid concentrated sweets for the next several days.  He will return for follow-up and the next cycle of chemotherapy in 2 weeks.  We are available to see him sooner if needed.  Plan reviewed with Dr. Cloretta.  Olam Ned ANP/GNP-BC   06/09/2024  8:32 AM        "

## 2024-06-09 NOTE — Progress Notes (Signed)
 Telephone follow up completed with patient during infusion for pancreas cancer. Patient receiving FOLFIRINOX and is followed by Dr. Cloretta.  Patient feels well. Denies nausea, vomiting, constipation and diarrhea.   Weight improved to 203 pounds and 1.6 oz.  Glucose elevated at 244.  Reports he has been eating concentrated sweets like cookies and candy. Wonders if that is what is causing elevated blood sugar. States he tried [employer] and it was tolerated. He could only eat 2 small pizzas before he was full. He tried tuna made with olive oil mayonnaise and that didn't work.   Nutrition Diagnosis: Food and Nutrition Related Knowledge Deficit resolved.  Patient advised to minimize simple sugars in the days before and after chemotherapy. Educated to eat with a source of protein and fat if he is going to indulge and treat himself. Verbalized understanding. No other questions or concerns.  Will continue to monitor weight changes and follow as needed. Patient encouraged to reach out to RD with questions.

## 2024-06-10 ENCOUNTER — Other Ambulatory Visit: Payer: Self-pay

## 2024-06-10 LAB — CANCER ANTIGEN 19-9: CA 19-9: 19497 U/mL — ABNORMAL HIGH (ref 0–35)

## 2024-06-11 ENCOUNTER — Inpatient Hospital Stay

## 2024-06-11 VITALS — BP 127/84 | HR 69 | Temp 98.1°F | Resp 16

## 2024-06-11 DIAGNOSIS — C259 Malignant neoplasm of pancreas, unspecified: Secondary | ICD-10-CM

## 2024-06-11 DIAGNOSIS — Z5111 Encounter for antineoplastic chemotherapy: Secondary | ICD-10-CM | POA: Diagnosis not present

## 2024-06-11 MED ORDER — PEGFILGRASTIM-JMDB 6 MG/0.6ML ~~LOC~~ SOSY
6.0000 mg | PREFILLED_SYRINGE | Freq: Once | SUBCUTANEOUS | Status: AC
  Administered 2024-06-11: 6 mg via SUBCUTANEOUS
  Filled 2024-06-11: qty 0.6

## 2024-06-11 NOTE — Patient Instructions (Signed)
 Implanted [facility] Guide An implanted port is a device that is placed under the skin. It is usually placed in the chest. The device may vary based on the need. Implanted ports can be used to give IV medicine, to take blood, or to give fluids. You may have an implanted port if: You need IV medicine that would be irritating to the small veins in your hands or arms. You need IV medicines, such as chemotherapy, for a long period of time. You need IV nutrition for a long period of time. You may have fewer limitations when using a port than you would if you used other types of long-term IVs. You will also likely be able to return to normal activities after your incision heals. An implanted port has two main parts: Reservoir. The reservoir is the part where a needle is inserted to give medicines or draw blood. The reservoir is round. After the port is placed, it appears as a small, raised area under your skin. Catheter. The catheter is a small, thin tube that connects the reservoir to a vein. Medicine that is inserted into the reservoir goes into the catheter and then into the vein. How is my port accessed? To access your port: A numbing cream may be placed on the skin over the port site. Your health care provider will put on a mask and sterile gloves. The skin over your port will be cleaned carefully with a germ-killing soap and allowed to dry. Your health care provider will gently pinch the port and insert a needle into it. Your health care provider will check for a blood return to make sure the port is in the vein and is still working (patent). If your port needs to remain accessed to get medicine continuously (constant infusion), your health care provider will place a clear bandage (dressing) over the needle site. The dressing and needle will need to be changed every week, or as told by your health care provider. What is flushing? Flushing helps keep the port working. Follow instructions from your  health care provider about how and when to flush the port. Ports are usually flushed with saline solution or a medicine called heparin. The need for flushing will depend on how the port is used: If the port is only used from time to time to give medicines or draw blood, the port may need to be flushed: Before and after medicines have been given. Before and after blood has been drawn. As part of routine maintenance. Flushing may be recommended every 4-6 weeks. If a constant infusion is running, the port may not need to be flushed. Throw away any syringes in a disposal container that is meant for sharp items (sharps container). You can buy a sharps container from a pharmacy, or you can make one by using an empty hard plastic bottle with a cover. How long will my port stay implanted? The port can stay in for as long as your health care provider thinks it is needed. When it is time for the port to come out, a surgery will be done to remove it. The surgery will be similar to the procedure that was done to put the port in. Follow these instructions at home: Caring for your port and port site Flush your port as told by your health care provider. If you need an infusion over several days, follow instructions from your health care provider about how to take care of your port site. Make sure you: Change your  dressing as told by your health care provider. Wash your hands with soap and water for at least 20 seconds before and after you change your dressing. If soap and water are not available, use alcohol-based hand sanitizer. Place any used dressings or infusion bags into a plastic bag. Throw that bag in the trash. Keep the dressing that covers the needle clean and dry. Do not get it wet. Do not use scissors or sharp objects near the infusion tubing. Keep any external tubes clamped, unless they are being used. Check your port site every day for signs of infection. Check for: Redness, swelling, or  pain. Fluid or blood. Warmth. Pus or a bad smell. Protect the skin around the port site. Avoid wearing bra straps that rub or irritate the site. Protect the skin around your port from seat belts. Place a soft pad over your chest if needed. Bathe or shower as told by your health care provider. The site may get wet as long as you are not actively receiving an infusion. General instructions  Return to your normal activities as told by your health care provider. Ask your health care provider what activities are safe for you. Carry a medical alert card or wear a medical alert bracelet at all times. This will let health care providers know that you have an implanted port in case of an emergency. Where to find more information American Cancer Society: www.cancer.org American Society of Clinical Oncology: www.cancer.net Contact a health care provider if: You have a fever or chills. You have redness, swelling, or pain at the port site. You have fluid or blood coming from your port site. Your incision feels warm to the touch. You have pus or a bad smell coming from the port site. Summary Implanted ports are usually placed in the chest for long-term IV access. Follow instructions from your health care provider about flushing the port and changing bandages (dressings). Take care of the area around your port by avoiding clothing that puts pressure on the area, and by watching for signs of infection. Protect the skin around your port from seat belts. Place a soft pad over your chest if needed. Contact a health care provider if you have a fever or you have redness, swelling, pain, fluid, or a bad smell at the port site. This information is not intended to replace advice given to you by your health care provider. Make sure you discuss any questions you have with your health care provider. Document Revised: 12/07/2020 Document Reviewed: 12/07/2020 Elsevier Patient Education  2024 Elsevier  Inc.  Pegfilgrastim Injection What is this medication? PEGFILGRASTIM (PEG fil gra stim) lowers the risk of infection in people who are receiving chemotherapy. It works by Systems analyst make more white blood cells, which protects your body from infection. It may also be used to help people who have been exposed to high doses of radiation. This medicine may be used for other purposes; ask your health care provider or pharmacist if you have questions. COMMON BRAND NAME(S): Cherly Hensen, Neulasta, Nyvepria, Stimufend, UDENYCA, UDENYCA ONBODY, Ziextenzo What should I tell my care team before I take this medication? They need to know if you have any of these conditions: Kidney disease Latex allergy Ongoing radiation therapy Sickle cell disease Skin reactions to acrylic adhesives (On-Body Injector only) An unusual or allergic reaction to pegfilgrastim, filgrastim, other medications, foods, dyes, or preservatives Pregnant or trying to get pregnant Breast-feeding How should I use this medication? This medication is for injection  under the skin. If you get this medication at home, you will be taught how to prepare and give the pre-filled syringe or how to use the On-body Injector. Refer to the patient Instructions for Use for detailed instructions. Use exactly as directed. Tell your care team immediately if you suspect that the On-body Injector may not have performed as intended or if you suspect the use of the On-body Injector resulted in a missed or partial dose. It is important that you put your used needles and syringes in a special sharps container. Do not put them in a trash can. If you do not have a sharps container, call your pharmacist or care team to get one. Talk to your care team about the use of this medication in children. While this medication may be prescribed for selected conditions, precautions do apply. Overdosage: If you think you have taken too much of this medicine contact  a poison control center or emergency room at once. NOTE: This medicine is only for you. Do not share this medicine with others. What if I miss a dose? It is important not to miss your dose. Call your care team if you miss your dose. If you miss a dose due to an On-body Injector failure or leakage, a new dose should be administered as soon as possible using a single prefilled syringe for manual use. What may interact with this medication? Interactions have not been studied. This list may not describe all possible interactions. Give your health care provider a list of all the medicines, herbs, non-prescription drugs, or dietary supplements you use. Also tell them if you smoke, drink alcohol, or use illegal drugs. Some items may interact with your medicine. What should I watch for while using this medication? Your condition will be monitored carefully while you are receiving this medication. You may need blood work done while you are taking this medication. Talk to your care team about your risk of cancer. You may be more at risk for certain types of cancer if you take this medication. If you are going to need a MRI, CT scan, or other procedure, tell your care team that you are using this medication (On-Body Injector only). What side effects may I notice from receiving this medication? Side effects that you should report to your care team as soon as possible: Allergic reactions--skin rash, itching, hives, swelling of the face, lips, tongue, or throat Capillary leak syndrome--stomach or muscle pain, unusual weakness or fatigue, feeling faint or lightheaded, decrease in the amount of urine, swelling of the ankles, hands, or feet, trouble breathing High white blood cell level--fever, fatigue, trouble breathing, night sweats, change in vision, weight loss Inflammation of the aorta--fever, fatigue, back, chest, or stomach pain, severe headache Kidney injury (glomerulonephritis)--decrease in the amount of  urine, red or dark brown urine, foamy or bubbly urine, swelling of the ankles, hands, or feet Shortness of breath or trouble breathing Spleen injury--pain in upper left stomach or shoulder Unusual bruising or bleeding Side effects that usually do not require medical attention (report to your care team if they continue or are bothersome): Bone pain Pain in the hands or feet This list may not describe all possible side effects. Call your doctor for medical advice about side effects. You may report side effects to FDA at 1-800-FDA-1088. Where should I keep my medication? Keep out of the reach of children. If you are using this medication at home, you will be instructed on how to store it. Throw away  any unused medication after the expiration date on the label. NOTE: This sheet is a summary. It may not cover all possible information. If you have questions about this medicine, talk to your doctor, pharmacist, or health care provider.  2024 Elsevier/Gold Standard (2021-05-06 00:00:00)

## 2024-06-20 ENCOUNTER — Other Ambulatory Visit: Payer: Self-pay | Admitting: Oncology

## 2024-06-20 DIAGNOSIS — C259 Malignant neoplasm of pancreas, unspecified: Secondary | ICD-10-CM

## 2024-06-23 ENCOUNTER — Inpatient Hospital Stay: Admitting: Nutrition

## 2024-06-23 ENCOUNTER — Encounter: Payer: Self-pay | Admitting: Nurse Practitioner

## 2024-06-23 ENCOUNTER — Inpatient Hospital Stay

## 2024-06-23 ENCOUNTER — Inpatient Hospital Stay: Admitting: Nurse Practitioner

## 2024-06-23 ENCOUNTER — Other Ambulatory Visit (HOSPITAL_BASED_OUTPATIENT_CLINIC_OR_DEPARTMENT_OTHER): Payer: Self-pay

## 2024-06-23 ENCOUNTER — Inpatient Hospital Stay: Attending: Nurse Practitioner

## 2024-06-23 ENCOUNTER — Encounter: Payer: Self-pay | Admitting: Oncology

## 2024-06-23 ENCOUNTER — Other Ambulatory Visit: Payer: Self-pay | Admitting: *Deleted

## 2024-06-23 VITALS — BP 144/87 | HR 68 | Temp 98.3°F | Resp 18

## 2024-06-23 VITALS — BP 135/93 | HR 78 | Temp 97.8°F | Resp 18 | Ht 76.0 in | Wt 203.7 lb

## 2024-06-23 DIAGNOSIS — C259 Malignant neoplasm of pancreas, unspecified: Secondary | ICD-10-CM

## 2024-06-23 DIAGNOSIS — G893 Neoplasm related pain (acute) (chronic): Secondary | ICD-10-CM | POA: Insufficient documentation

## 2024-06-23 DIAGNOSIS — D649 Anemia, unspecified: Secondary | ICD-10-CM | POA: Insufficient documentation

## 2024-06-23 DIAGNOSIS — E039 Hypothyroidism, unspecified: Secondary | ICD-10-CM | POA: Insufficient documentation

## 2024-06-23 DIAGNOSIS — K859 Acute pancreatitis without necrosis or infection, unspecified: Secondary | ICD-10-CM | POA: Insufficient documentation

## 2024-06-23 DIAGNOSIS — C250 Malignant neoplasm of head of pancreas: Secondary | ICD-10-CM | POA: Insufficient documentation

## 2024-06-23 DIAGNOSIS — I10 Essential (primary) hypertension: Secondary | ICD-10-CM | POA: Insufficient documentation

## 2024-06-23 DIAGNOSIS — Z79631 Long term (current) use of antimetabolite agent: Secondary | ICD-10-CM | POA: Insufficient documentation

## 2024-06-23 DIAGNOSIS — Z79634 Long term (current) use of topoisomerase inhibitor: Secondary | ICD-10-CM | POA: Insufficient documentation

## 2024-06-23 DIAGNOSIS — D709 Neutropenia, unspecified: Secondary | ICD-10-CM | POA: Insufficient documentation

## 2024-06-23 DIAGNOSIS — D509 Iron deficiency anemia, unspecified: Secondary | ICD-10-CM | POA: Insufficient documentation

## 2024-06-23 DIAGNOSIS — Z800 Family history of malignant neoplasm of digestive organs: Secondary | ICD-10-CM | POA: Insufficient documentation

## 2024-06-23 DIAGNOSIS — Z79630 Long term (current) use of alkylating agent: Secondary | ICD-10-CM | POA: Insufficient documentation

## 2024-06-23 DIAGNOSIS — R748 Abnormal levels of other serum enzymes: Secondary | ICD-10-CM | POA: Insufficient documentation

## 2024-06-23 DIAGNOSIS — C787 Secondary malignant neoplasm of liver and intrahepatic bile duct: Secondary | ICD-10-CM | POA: Insufficient documentation

## 2024-06-23 DIAGNOSIS — R200 Anesthesia of skin: Secondary | ICD-10-CM | POA: Insufficient documentation

## 2024-06-23 DIAGNOSIS — Z5111 Encounter for antineoplastic chemotherapy: Secondary | ICD-10-CM | POA: Insufficient documentation

## 2024-06-23 DIAGNOSIS — R202 Paresthesia of skin: Secondary | ICD-10-CM | POA: Insufficient documentation

## 2024-06-23 DIAGNOSIS — K219 Gastro-esophageal reflux disease without esophagitis: Secondary | ICD-10-CM | POA: Insufficient documentation

## 2024-06-23 LAB — CBC WITH DIFFERENTIAL (CANCER CENTER ONLY)
Abs Immature Granulocytes: 0.77 K/uL — ABNORMAL HIGH (ref 0.00–0.07)
Basophils Absolute: 0.1 K/uL (ref 0.0–0.1)
Basophils Relative: 0 %
Eosinophils Absolute: 0.1 K/uL (ref 0.0–0.5)
Eosinophils Relative: 0 %
HCT: 29.7 % — ABNORMAL LOW (ref 39.0–52.0)
Hemoglobin: 9.6 g/dL — ABNORMAL LOW (ref 13.0–17.0)
Immature Granulocytes: 5 %
Lymphocytes Relative: 10 %
Lymphs Abs: 1.6 K/uL (ref 0.7–4.0)
MCH: 20.5 pg — ABNORMAL LOW (ref 26.0–34.0)
MCHC: 32.3 g/dL (ref 30.0–36.0)
MCV: 63.3 fL — ABNORMAL LOW (ref 80.0–100.0)
Monocytes Absolute: 1.2 K/uL — ABNORMAL HIGH (ref 0.1–1.0)
Monocytes Relative: 8 %
Neutro Abs: 12.3 K/uL — ABNORMAL HIGH (ref 1.7–7.7)
Neutrophils Relative %: 77 %
Platelet Count: 148 K/uL — ABNORMAL LOW (ref 150–400)
RBC: 4.69 MIL/uL (ref 4.22–5.81)
RDW: 20.7 % — ABNORMAL HIGH (ref 11.5–15.5)
WBC Count: 16 K/uL — ABNORMAL HIGH (ref 4.0–10.5)
nRBC: 0.9 % — ABNORMAL HIGH (ref 0.0–0.2)

## 2024-06-23 LAB — CMP (CANCER CENTER ONLY)
ALT: 46 U/L — ABNORMAL HIGH (ref 0–44)
AST: 40 U/L (ref 15–41)
Albumin: 4.2 g/dL (ref 3.5–5.0)
Alkaline Phosphatase: 319 U/L — ABNORMAL HIGH (ref 38–126)
Anion gap: 11 (ref 5–15)
BUN: 10 mg/dL (ref 6–20)
CO2: 27 mmol/L (ref 22–32)
Calcium: 9.6 mg/dL (ref 8.9–10.3)
Chloride: 100 mmol/L (ref 98–111)
Creatinine: 1 mg/dL (ref 0.61–1.24)
GFR, Estimated: >60 mL/min
Glucose, Bld: 162 mg/dL — ABNORMAL HIGH (ref 70–99)
Potassium: 3.3 mmol/L — ABNORMAL LOW (ref 3.5–5.1)
Sodium: 137 mmol/L (ref 135–145)
Total Bilirubin: 0.7 mg/dL (ref 0.0–1.2)
Total Protein: 7.7 g/dL (ref 6.5–8.1)

## 2024-06-23 LAB — MAGNESIUM: Magnesium: 1.8 mg/dL (ref 1.7–2.4)

## 2024-06-23 LAB — TSH: TSH: 45 u[IU]/mL — ABNORMAL HIGH (ref 0.350–4.500)

## 2024-06-23 MED ORDER — SODIUM CHLORIDE 0.9 % IV SOLN
1800.0000 mg/m2 | INTRAVENOUS | Status: DC
  Administered 2024-06-23: 3900 mg via INTRAVENOUS
  Filled 2024-06-23: qty 78

## 2024-06-23 MED ORDER — PALONOSETRON HCL INJECTION 0.25 MG/5ML
0.2500 mg | Freq: Once | INTRAVENOUS | Status: AC
  Administered 2024-06-23: 0.25 mg via INTRAVENOUS
  Filled 2024-06-23: qty 5

## 2024-06-23 MED ORDER — HYDROMORPHONE HCL 2 MG PO TABS
1.0000 mg | ORAL_TABLET | Freq: Four times a day (QID) | ORAL | 0 refills | Status: AC | PRN
  Filled 2024-06-23: qty 30, 15d supply, fill #0

## 2024-06-23 MED ORDER — SODIUM CHLORIDE 0.9 % IV SOLN
200.0000 mg/m2 | Freq: Once | INTRAVENOUS | Status: AC
  Administered 2024-06-23: 436 mg via INTRAVENOUS
  Filled 2024-06-23: qty 21.8

## 2024-06-23 MED ORDER — OXALIPLATIN CHEMO INJECTION 100 MG/20ML
85.0000 mg/m2 | Freq: Once | INTRAVENOUS | Status: AC
  Administered 2024-06-23: 200 mg via INTRAVENOUS
  Filled 2024-06-23: qty 40

## 2024-06-23 MED ORDER — DEXTROSE 5 % IV SOLN: INTRAVENOUS | Status: DC

## 2024-06-23 MED ORDER — SODIUM CHLORIDE 0.9 % IV SOLN
150.0000 mg | Freq: Once | INTRAVENOUS | Status: AC
  Administered 2024-06-23: 150 mg via INTRAVENOUS
  Filled 2024-06-23: qty 150

## 2024-06-23 MED ORDER — PROCHLORPERAZINE MALEATE 10 MG PO TABS
10.0000 mg | ORAL_TABLET | Freq: Once | ORAL | Status: AC
  Administered 2024-06-23: 10 mg via ORAL
  Filled 2024-06-23: qty 1

## 2024-06-23 MED ORDER — DEXAMETHASONE SOD PHOSPHATE PF 10 MG/ML IJ SOLN
10.0000 mg | Freq: Once | INTRAMUSCULAR | Status: AC
  Administered 2024-06-23: 10 mg via INTRAVENOUS

## 2024-06-23 MED ORDER — SODIUM CHLORIDE 0.9 % IV SOLN: Freq: Once | INTRAVENOUS | Status: AC

## 2024-06-23 MED ORDER — ATROPINE SULFATE 1 MG/ML IV SOLN
0.5000 mg | Freq: Once | INTRAVENOUS | Status: AC | PRN
  Administered 2024-06-23: 0.5 mg via INTRAVENOUS
  Filled 2024-06-23: qty 1

## 2024-06-23 MED ORDER — SODIUM CHLORIDE 0.9 % IV SOLN
100.0000 mg/m2 | Freq: Once | INTRAVENOUS | Status: AC
  Administered 2024-06-23: 200 mg via INTRAVENOUS
  Filled 2024-06-23: qty 10

## 2024-06-23 NOTE — Progress Notes (Signed)
 " [facility] OFFICE PROGRESS NOTE   Diagnosis: Pancreas cancer  INTERVAL HISTORY:   Douglas Harper returns as scheduled.  He completed another cycle of FOLFIRINOX 06/09/2024.  Oxaliplatin  was dose reduced due to neutropenia.  He denies nausea/vomiting.  No mouth sores.  No diarrhea.  No hand or foot pain or redness.  He has mild persistent cold sensitivity.  He has intermittent tingling in the fingertips not related to cold exposure.  He occasionally takes hydromorphone  for abdominal pain.  He has a good appetite.  He denies bone pain following Fulphila .  Objective:  Vital signs in last 24 hours:  Blood pressure (!) 135/93, pulse 78, temperature 97.8 F (36.6 C), temperature source Temporal, resp. rate 18, height 6' 4 (1.93 m), weight 203 lb 11.2 oz (92.4 kg), SpO2 100%.    HEENT: No thrush or ulcers. Resp: Lungs clear bilaterally. Cardio: Regular rate and rhythm. GI: No hepatosplenomegaly.  Nontender. Vascular: No leg edema. Neuro: Vibratory sense intact over the fingertips per tuning fork exam. Skin: Palms without erythema. Port-A-Cath without erythema.  Lab Results:  Lab Results  Component Value Date   WBC 16.0 (H) 06/23/2024   HGB 9.6 (L) 06/23/2024   HCT 29.7 (L) 06/23/2024   MCV 63.3 (L) 06/23/2024   PLT 148 (L) 06/23/2024   NEUTROABS 12.3 (H) 06/23/2024    Imaging:  No results found.  Medications: I have reviewed the patient's current medications.  Assessment/Plan: Pancreas cancer, stage IV (cT1c, cN0, pM1) 03/09/2024 CTs chest, abdomen, and pelvis: 1.6 x 1.9 cm pancreas head mass, upstream pancreatic duct dilation, multiple hypoattenuating liver lesions, fat stranding surrounding the pancreas head and proximal duodenum, no metastatic disease in the chest Ultrasound-guided biopsy of left liver mass 03/11/2024: Invasive moderately differentiated pancreatic adenocarcinoma, pancreas with severe ductal dysplasia (PanIN-3), no hepatic parenchyma is present.   MSS, HRD signature negative, tumor mutation burden 0, KRAS G 12R Elevated CA 19-9 Elevated liver enzymes and bilirubin Progressive rise in liver enzymes and bilirubin 03/24/2024 CTs 03/24/2024-increase in mild to moderate biliary duct dilatation; pancreatic head/uncinate process mass and liver metastasis relatively similar 03/25/2024: Biliary stricture with upstream dilation, head of pancreas with double duct sign uncovered metal stent 03/26/2024 ultrasound biopsy of right liver lesion: Metastatic moderately differentiated adenocarcinoma consistent with a pancreas primary; foundation 1-HRDsig positive, microsatellite stable, tumor mutation burden 0, KRAS G 12R, no reportable RNA variants 03/27/2024 CT abdomen/pelvis: Interval placement of a biliary stent extending into the cystic duct, not clearly communicating with the common duct occiput to the obstructing mass.,  Stable biliary duct dilation, stable hepatic metastases and pancreas head mass, development of mild peripancreatic inflammatory stranding Cycle 1 FOLFOX 04/15/2024 Cycle 2 FOLFOX 04/28/2024 Cycle 3 FOLFIRINOX 05/12/2024, irinotecan  dose reduced secondary to elevated bilirubin and liver enzymes Cycle 4 FOLFIRINOX 05/26/2024, no dose changes Cycle 5 FOLFIRINOX 06/09/2024, oxaliplatin  dose reduced due to neutropenia, Fulphila  Cycle 6 FOLFIRINOX 06/23/2024, Fulphila  Pain secondary to #1 Hypothyroidism Family history of colon cancer Microcytic anemia 03/24/2024 Hemoglobin electrophoresis: 94.6% A, 5.4% A2 Hyperthyroidism status post RAI in 2009 Gastroesophageal reflux disease Glaucoma Hypertension Hospital admission 03/24/2024 with obstructive jaundice, status post common duct stent placement Hospital admission 03/27/2024 with post ERCP pancreatitis, fever migration of bile duct stent to the cystic duct, increased abdominal pain; 03/28/2024 ERCP with metal stent removal followed by placement of self-expanding metal stent    Disposition: Douglas Harper appears stable.  He has completed 5 cycles of systemic therapy (2 FOLFOX, 3 FOLFIRINOX).  Overall tolerating treatment  well.  He has mild persistent cold sensitivity due to oxaliplatin .  We will continue to monitor.  He has a good performance status.  No clinical evidence of disease progression.  Plan to proceed with cycle 6 FOLFIRINOX today as scheduled.  Fulphila  on day of pump discontinuation.  Restaging CTs prior to next office visit, order placed.  CBC and chemistry panel reviewed.  Labs are adequate for treatment.  He has mild hypokalemia.  Magnesium level is normal.  He will increase K-Dur to 10 meq 3 tablets daily.  He will return for follow-up and the next cycle of chemotherapy in 2 weeks.  We are available to see him sooner if needed.   Olam Ned ANP/GNP-BC   06/23/2024  10:10 AM        "

## 2024-06-23 NOTE — Progress Notes (Signed)
 Proceed with full dose oxaliplatin  today. Pt will receive Fulphila  with pump DC.  Bridgett Leach [place], [place], BCPS, BCOP 06/23/2024 10:54 AM

## 2024-06-23 NOTE — Progress Notes (Signed)
 Patient requested to see RD to answer questions.  Reports he is eating well. No nutrition impact symptoms since last treatment but is experiencing some nausea during infusion today while oxaliplatin  infusing. Weight increased to 203.7 pounds from 203.1 pounds on December 22.  Questions answered regarding diet. Will continue to follow as needed/per patient request.

## 2024-06-23 NOTE — Patient Instructions (Signed)

## 2024-06-23 NOTE — Progress Notes (Signed)
 Pt c/o mild nausea,  oxaliplatin  started 25 minutes ago. He uis back at his regular dose of oxaliplatin  (85mg /m2). Last treatment it was deceased temporarily to 65mg /m2. Obtained provider order for generic compazine .  1314: Pt reports resolution of the nausea.

## 2024-06-23 NOTE — Patient Instructions (Signed)
 [facility] - A DEPT OF [facility]  Discharge Instructions: Thank you for choosing [facility] to provide your oncology and hematology care.   If you have a lab appointment with the Cancer Center, please go directly to the Cancer Center and check in at the registration area.   Wear comfortable clothing and clothing appropriate for easy access to any Portacath or PICC line.   We strive to give you quality time with your provider. You may need to reschedule your appointment if you arrive late (15 or more minutes).  Arriving late affects you and other patients whose appointments are after yours.  Also, if you miss three or more appointments without notifying the office, you may be dismissed from the clinic at the providers discretion.      For prescription refill requests, have your pharmacy contact our office and allow 72 hours for refills to be completed.    Today you received the following chemotherapy and/or immunotherapy agents: oxaliplatin , leucovorin , irinotecan , fluorouracil        To help prevent nausea and vomiting after your treatment, we encourage you to take your nausea medication as directed.  BELOW ARE SYMPTOMS THAT SHOULD BE REPORTED IMMEDIATELY: *FEVER GREATER THAN 100.4 F (38 C) OR HIGHER *CHILLS OR SWEATING *NAUSEA AND VOMITING THAT IS NOT CONTROLLED WITH YOUR NAUSEA MEDICATION *UNUSUAL SHORTNESS OF BREATH *UNUSUAL BRUISING OR BLEEDING *URINARY PROBLEMS (pain or burning when urinating, or frequent urination) *BOWEL PROBLEMS (unusual diarrhea, constipation, pain near the anus) TENDERNESS IN MOUTH AND THROAT WITH OR WITHOUT PRESENCE OF ULCERS (sore throat, sores in mouth, or a toothache) UNUSUAL RASH, SWELLING OR PAIN  UNUSUAL VAGINAL DISCHARGE OR ITCHING   Items with * indicate a potential emergency and should be followed up as soon as possible or go to the Emergency Department if any problems should occur.  Please show the  CHEMOTHERAPY ALERT CARD or IMMUNOTHERAPY ALERT CARD at check-in to the Emergency Department and triage nurse.  Should you have questions after your visit or need to cancel or reschedule your appointment, please contact [facility] - A DEPT OF [facility]  Dept: [phone]  and follow the prompts.  Office hours are 8:00 a.m. to 4:30 p.m. Monday - Friday. Please note that voicemails left after 4:00 p.m. may not be returned until the following business day.  We are closed weekends and major holidays. You have access to a nurse at all times for urgent questions. Please call the main number to the clinic Dept: [phone] and follow the prompts.   For any non-urgent questions, you may also contact your provider using MyChart. We now offer e-Visits for anyone 56 and older to request care online for non-urgent symptoms. For details visit [email or web address].   Also download the MyChart app! Go to the app store, search MyChart, open the app, select [place], and log in with your MyChart username and password.

## 2024-06-23 NOTE — Progress Notes (Signed)
 Notified Douglas Harper of CT scan on 07/02/24 at [facility] with arrival at 2:15 for 2:30 pm scan. Port access at 1:30 pm that day.

## 2024-06-23 NOTE — Progress Notes (Signed)
 Patient seen by Olam Ned NP today  Vitals are within treatment parameters:BP elevated at 134/97 and 135/93==OK to treat  Labs are within treatment parameters: Yes   Treatment plan has been signed: Yes   Per physician team, Patient is ready for treatment and there are NO modifications to the treatment plan.

## 2024-06-24 LAB — T4: T4, Total: 8 ug/dL (ref 4.5–12.0)

## 2024-06-24 LAB — CANCER ANTIGEN 19-9: CA 19-9: 13843 U/mL — ABNORMAL HIGH (ref 0–35)

## 2024-06-25 ENCOUNTER — Inpatient Hospital Stay

## 2024-06-25 ENCOUNTER — Telehealth: Payer: Self-pay

## 2024-06-25 VITALS — BP 140/93 | HR 73 | Temp 97.7°F | Resp 18

## 2024-06-25 DIAGNOSIS — C259 Malignant neoplasm of pancreas, unspecified: Secondary | ICD-10-CM

## 2024-06-25 MED ORDER — PEGFILGRASTIM-JMDB 6 MG/0.6ML ~~LOC~~ SOSY
6.0000 mg | PREFILLED_SYRINGE | Freq: Once | SUBCUTANEOUS | Status: AC
  Administered 2024-06-25: 6 mg via SUBCUTANEOUS
  Filled 2024-06-25: qty 0.6

## 2024-06-25 NOTE — Telephone Encounter (Signed)
-----   Message from Olam Ned, NP sent at 06/24/2024 11:51 AM EST ----- Please forward TSH/T4 to his PCP and request assistance with management.  Please also let Mr. Bubb know we are doing this.  Thanks

## 2024-06-25 NOTE — Telephone Encounter (Signed)
 Contacted patient in regards to Ca19.9 being lower. ReviewedTSH/T4 lab results, made aware would forward lab results to Starleen Monte MD PCP at [facility] per provider request. Requested assistance with PCP to manage TSH/T4 labs moving forward, understood. No further questions.

## 2024-06-25 NOTE — Telephone Encounter (Signed)
-----   Message from Olam Ned, NP sent at 06/25/2024  8:19 AM EST ----- Please let him know ca19.9  is lower.

## 2024-06-25 NOTE — Patient Instructions (Signed)

## 2024-06-27 ENCOUNTER — Other Ambulatory Visit: Payer: Self-pay

## 2024-07-02 ENCOUNTER — Inpatient Hospital Stay

## 2024-07-02 ENCOUNTER — Other Ambulatory Visit (HOSPITAL_COMMUNITY)

## 2024-07-02 ENCOUNTER — Ambulatory Visit (HOSPITAL_BASED_OUTPATIENT_CLINIC_OR_DEPARTMENT_OTHER)
Admission: RE | Admit: 2024-07-02 | Discharge: 2024-07-02 | Disposition: A | Discharge status: Home / Self Care | Source: Ambulatory Visit | Attending: Nurse Practitioner | Admitting: Nurse Practitioner

## 2024-07-02 DIAGNOSIS — C259 Malignant neoplasm of pancreas, unspecified: Secondary | ICD-10-CM | POA: Insufficient documentation

## 2024-07-02 MED ORDER — HEPARIN SOD (PORK) LOCK FLUSH 100 UNIT/ML IV SOLN
500.0000 [IU] | Freq: Once | INTRAVENOUS | Status: AC
  Administered 2024-07-02: 500 [IU] via INTRAVENOUS

## 2024-07-02 MED ORDER — IOHEXOL 300 MG/ML  SOLN
100.0000 mL | Freq: Once | INTRAMUSCULAR | Status: AC | PRN
  Administered 2024-07-02: 100 mL via INTRAVENOUS

## 2024-07-02 NOTE — Patient Instructions (Signed)

## 2024-07-06 ENCOUNTER — Other Ambulatory Visit: Payer: Self-pay | Admitting: Oncology

## 2024-07-06 DIAGNOSIS — C259 Malignant neoplasm of pancreas, unspecified: Secondary | ICD-10-CM

## 2024-07-07 ENCOUNTER — Other Ambulatory Visit (HOSPITAL_BASED_OUTPATIENT_CLINIC_OR_DEPARTMENT_OTHER): Payer: Self-pay

## 2024-07-07 ENCOUNTER — Encounter: Payer: Self-pay | Admitting: Oncology

## 2024-07-07 ENCOUNTER — Inpatient Hospital Stay: Admitting: Nutrition

## 2024-07-07 ENCOUNTER — Inpatient Hospital Stay: Admitting: Oncology

## 2024-07-07 ENCOUNTER — Inpatient Hospital Stay

## 2024-07-07 VITALS — BP 140/95 | HR 71 | Temp 98.1°F | Resp 18

## 2024-07-07 VITALS — BP 130/96 | HR 82 | Temp 98.2°F | Resp 18 | Ht 76.0 in | Wt 206.6 lb

## 2024-07-07 DIAGNOSIS — C259 Malignant neoplasm of pancreas, unspecified: Secondary | ICD-10-CM

## 2024-07-07 DIAGNOSIS — E890 Postprocedural hypothyroidism: Secondary | ICD-10-CM

## 2024-07-07 LAB — CBC WITH DIFFERENTIAL (CANCER CENTER ONLY)
Abs Immature Granulocytes: 0.97 K/uL — ABNORMAL HIGH (ref 0.00–0.07)
Basophils Absolute: 0.1 K/uL (ref 0.0–0.1)
Basophils Relative: 0 %
Eosinophils Absolute: 0.1 K/uL (ref 0.0–0.5)
Eosinophils Relative: 1 %
HCT: 29.4 % — ABNORMAL LOW (ref 39.0–52.0)
Hemoglobin: 9.6 g/dL — ABNORMAL LOW (ref 13.0–17.0)
Immature Granulocytes: 6 %
Lymphocytes Relative: 11 %
Lymphs Abs: 1.8 K/uL (ref 0.7–4.0)
MCH: 20.9 pg — ABNORMAL LOW (ref 26.0–34.0)
MCHC: 32.7 g/dL (ref 30.0–36.0)
MCV: 64.1 fL — ABNORMAL LOW (ref 80.0–100.0)
Monocytes Absolute: 1.5 K/uL — ABNORMAL HIGH (ref 0.1–1.0)
Monocytes Relative: 9 %
Neutro Abs: 12.1 K/uL — ABNORMAL HIGH (ref 1.7–7.7)
Neutrophils Relative %: 73 %
Platelet Count: 119 K/uL — ABNORMAL LOW (ref 150–400)
RBC: 4.59 MIL/uL (ref 4.22–5.81)
RDW: 21.9 % — ABNORMAL HIGH (ref 11.5–15.5)
WBC Count: 16.6 K/uL — ABNORMAL HIGH (ref 4.0–10.5)
nRBC: 0.5 % — ABNORMAL HIGH (ref 0.0–0.2)

## 2024-07-07 LAB — CMP (CANCER CENTER ONLY)
ALT: 80 U/L — ABNORMAL HIGH (ref 0–44)
AST: 71 U/L — ABNORMAL HIGH (ref 15–41)
Albumin: 4.3 g/dL (ref 3.5–5.0)
Alkaline Phosphatase: 349 U/L — ABNORMAL HIGH (ref 38–126)
Anion gap: 11 (ref 5–15)
BUN: 9 mg/dL (ref 6–20)
CO2: 26 mmol/L (ref 22–32)
Calcium: 9.8 mg/dL (ref 8.9–10.3)
Chloride: 101 mmol/L (ref 98–111)
Creatinine: 1.06 mg/dL (ref 0.61–1.24)
GFR, Estimated: >60 mL/min
Glucose, Bld: 178 mg/dL — ABNORMAL HIGH (ref 70–99)
Potassium: 3.6 mmol/L (ref 3.5–5.1)
Sodium: 138 mmol/L (ref 135–145)
Total Bilirubin: 0.7 mg/dL (ref 0.0–1.2)
Total Protein: 7.7 g/dL (ref 6.5–8.1)

## 2024-07-07 MED ORDER — SODIUM CHLORIDE 0.9 % IV SOLN
150.0000 mg | Freq: Once | INTRAVENOUS | Status: AC
  Administered 2024-07-07: 150 mg via INTRAVENOUS
  Filled 2024-07-07: qty 150

## 2024-07-07 MED ORDER — SODIUM CHLORIDE 0.9 % IV SOLN
100.0000 mg/m2 | Freq: Once | INTRAVENOUS | Status: AC
  Administered 2024-07-07: 200 mg via INTRAVENOUS
  Filled 2024-07-07: qty 10

## 2024-07-07 MED ORDER — DEXTROSE 5 % IV SOLN: INTRAVENOUS | Status: DC

## 2024-07-07 MED ORDER — ATROPINE SULFATE 1 MG/ML IV SOLN
0.5000 mg | Freq: Once | INTRAVENOUS | Status: AC | PRN
  Administered 2024-07-07: 0.5 mg via INTRAVENOUS
  Filled 2024-07-07: qty 1

## 2024-07-07 MED ORDER — SODIUM CHLORIDE 0.9 % IV SOLN: Freq: Once | INTRAVENOUS | Status: AC

## 2024-07-07 MED ORDER — SODIUM CHLORIDE 0.9 % IV SOLN
1800.0000 mg/m2 | INTRAVENOUS | Status: DC
  Administered 2024-07-07: 3900 mg via INTRAVENOUS
  Filled 2024-07-07: qty 78

## 2024-07-07 MED ORDER — OXALIPLATIN CHEMO INJECTION 100 MG/20ML
65.0000 mg/m2 | Freq: Once | INTRAVENOUS | Status: AC
  Administered 2024-07-07: 150 mg via INTRAVENOUS
  Filled 2024-07-07: qty 20

## 2024-07-07 MED ORDER — SODIUM CHLORIDE 0.9 % IV SOLN
200.0000 mg/m2 | Freq: Once | INTRAVENOUS | Status: DC
  Filled 2024-07-07: qty 21.8

## 2024-07-07 MED ORDER — PALONOSETRON HCL INJECTION 0.25 MG/5ML
0.2500 mg | Freq: Once | INTRAVENOUS | Status: AC
  Administered 2024-07-07: 0.25 mg via INTRAVENOUS
  Filled 2024-07-07: qty 5

## 2024-07-07 MED ORDER — SODIUM CHLORIDE 0.9 % IV SOLN
200.0000 mg/m2 | Freq: Once | INTRAVENOUS | Status: AC
  Administered 2024-07-07: 436 mg via INTRAVENOUS
  Filled 2024-07-07: qty 21.8

## 2024-07-07 MED ORDER — LEVOTHYROXINE SODIUM 125 MCG PO TABS
125.0000 ug | ORAL_TABLET | Freq: Every day | ORAL | 0 refills | Status: AC
  Filled 2024-07-07 (×2): qty 90, 90d supply, fill #0

## 2024-07-07 MED ORDER — DEXAMETHASONE SOD PHOSPHATE PF 10 MG/ML IJ SOLN
10.0000 mg | Freq: Once | INTRAMUSCULAR | Status: AC
  Administered 2024-07-07: 10 mg via INTRAVENOUS
  Filled 2024-07-07: qty 1

## 2024-07-07 NOTE — Progress Notes (Signed)
 Brief nutrition follow up with patient during infusion for Pancreas cancer and followed by Dr. Cloretta. He has completed 6 cycles of FOLFIRINOX.  Reports pleased with weight gain to 206 pounds 9.6 oz from 203 pounds 1.6 oz December 22.  Labs include Glucose 178.  He has a good appetite. He is eating well and denies nutrition impact symptoms. Still avoiding fried foods. CT looks improved however, patient waiting on final report. No questions or concerns.  Will continue to follow up with patient as needed.

## 2024-07-07 NOTE — Progress Notes (Signed)
 Patient seen by Dr. Arley Hof today  Vitals are within treatment parameters:No (Please specify and give further instructions.)OK to proceed w/BP 130/96  Labs are within treatment parameters: No (Please specify and give further instructions.) ALT 80--OK to proceed  Treatment plan has been signed: Yes   Per physician team, Patient is ready for treatment. Please note the following modifications: Will dose reduce oxaliplatin  and remove gcsf injection

## 2024-07-07 NOTE — Progress Notes (Signed)
 " [facility] OFFICE PROGRESS NOTE   Diagnosis: Pancreas cancer  INTERVAL HISTORY:   [name] returns as scheduled.  He completed another cycle FOLFIRINOX on 06/23/2024.  He reports nausea after receiving G-CSF.  No mouth sores or diarrhea.  He reports persistent cold sensitivity.  No peripheral numbness.  Good appetite.  Objective:  Vital signs in last 24 hours:  Blood pressure (!) 130/96, pulse 82, temperature 98.2 F (36.8 C), temperature source Temporal, resp. rate 18, height 6' 4 (1.93 m), weight 206 lb 9.6 oz (93.7 kg), SpO2 100%.    HEENT: No thrush or ulcers Resp: Lungs clear bilaterally Cardio: Regular rate and rhythm GI: No hepatosplenomegaly, no mass, nontender Vascular: No leg edema Neuro: Mild loss of vibratory sense at the fingertips bilaterally    Portacath/PICC-without erythema  Lab Results:  Lab Results  Component Value Date   WBC 16.6 (H) 07/07/2024   HGB 9.6 (L) 07/07/2024   HCT 29.4 (L) 07/07/2024   MCV 64.1 (L) 07/07/2024   PLT 119 (L) 07/07/2024   NEUTROABS 12.1 (H) 07/07/2024    CMP  Lab Results  Component Value Date   NA 138 07/07/2024   K 3.6 07/07/2024   CL 101 07/07/2024   CO2 26 07/07/2024   GLUCOSE 178 (H) 07/07/2024   BUN 9 07/07/2024   CREATININE 1.06 07/07/2024   CALCIUM  9.8 07/07/2024   PROT 7.7 07/07/2024   ALBUMIN 4.3 07/07/2024   AST 71 (H) 07/07/2024   ALT 80 (H) 07/07/2024   ALKPHOS 349 (H) 07/07/2024   BILITOT 0.7 07/07/2024   GFRNONAA >60 07/07/2024   GFRAA 83 04/11/2019    Lab Results  Component Value Date   CEA1 43.8 (H) 03/10/2024   RJW800 13,843 (H) 06/23/2024      Medications: I have reviewed the patient's current medications.   Assessment/Plan: Pancreas cancer, stage IV (cT1c, cN0, pM1) 03/09/2024 CTs chest, abdomen, and pelvis: 1.6 x 1.9 cm pancreas head mass, upstream pancreatic duct dilation, multiple hypoattenuating liver lesions, fat stranding surrounding the pancreas head and  proximal duodenum, no metastatic disease in the chest Ultrasound-guided biopsy of left liver mass 03/11/2024: Invasive moderately differentiated pancreatic adenocarcinoma, pancreas with severe ductal dysplasia (PanIN-3), no hepatic parenchyma is present.  MSS, HRD signature negative, tumor mutation burden 0, KRAS G 12R Elevated CA 19-9 Elevated liver enzymes and bilirubin Progressive rise in liver enzymes and bilirubin 03/24/2024 CTs 03/24/2024-increase in mild to moderate biliary duct dilatation; pancreatic head/uncinate process mass and liver metastasis relatively similar 03/25/2024: Biliary stricture with upstream dilation, head of pancreas with double duct sign uncovered metal stent 03/26/2024 ultrasound biopsy of right liver lesion: Metastatic moderately differentiated adenocarcinoma consistent with a pancreas primary; foundation 1-HRDsig positive, microsatellite stable, tumor mutation burden 0, KRAS G 12R, no reportable RNA variants 03/27/2024 CT abdomen/pelvis: Interval placement of a biliary stent extending into the cystic duct, not clearly communicating with the common duct occiput to the obstructing mass.,  Stable biliary duct dilation, stable hepatic metastases and pancreas head mass, development of mild peripancreatic inflammatory stranding Cycle 1 FOLFOX 04/15/2024 Cycle 2 FOLFOX 04/28/2024 Cycle 3 FOLFIRINOX 05/12/2024, irinotecan  dose reduced secondary to elevated bilirubin and liver enzymes Cycle 4 FOLFIRINOX 05/26/2024, no dose changes Cycle 5 FOLFIRINOX 06/09/2024, oxaliplatin  dose reduced due to neutropenia, Fulphila  Cycle 6 FOLFIRINOX 06/23/2024, Fulphila  Pain secondary to #1 Hypothyroidism Family history of colon cancer Microcytic anemia 03/24/2024 Hemoglobin electrophoresis: 94.6% A, 5.4% A2 Hyperthyroidism status post RAI in 2009 Gastroesophageal reflux disease Glaucoma Hypertension Hospital admission  03/24/2024 with obstructive jaundice, status post common duct stent  placement Hospital admission 03/27/2024 with post ERCP pancreatitis, fever migration of bile duct stent to the cystic duct, increased abdominal pain; 03/28/2024 ERCP with metal stent removal followed by placement of self-expanding metal stent      Disposition: [name] has completed 6 cycles of FOLFIRINOX.  He continues to tolerate the chemotherapy well.  He has mild symptoms of oxaliplatin  neuropathy.  He has mild thrombocytopenia.  The oxaliplatin  will be dose reduced.  G-CSF will be held with this cycle.  He underwent restaging CTs 07/02/2024.  The final report is not available.  I reviewed the CT images with Mr. Charland and his family.  The pancreas mass and liver lesions appear smaller by my review.  The CA 19-9 was lower on 06/23/2024.  We will ask for a reading on the CT today.  He will complete another cycle of FOLFIRINOX today.  He will return for an office visit and chemotherapy in 2 weeks.  Arley Hof, MD  07/07/2024  8:43 AM   "

## 2024-07-07 NOTE — Patient Instructions (Signed)
 [facility] - A DEPT OF [facility]  Discharge Instructions: Thank you for choosing [facility] to provide your oncology and hematology care.   If you have a lab appointment with the Cancer Center, please go directly to the Cancer Center and check in at the registration area.   Wear comfortable clothing and clothing appropriate for easy access to any Portacath or PICC line.   We strive to give you quality time with your provider. You may need to reschedule your appointment if you arrive late (15 or more minutes).  Arriving late affects you and other patients whose appointments are after yours.  Also, if you miss three or more appointments without notifying the office, you may be dismissed from the clinic at the providers discretion.      For prescription refill requests, have your pharmacy contact our office and allow 72 hours for refills to be completed.    Today you received the following chemotherapy and/or immunotherapy agents: oxaliplatin , leucovorin , irinotecan , fluorouracil        To help prevent nausea and vomiting after your treatment, we encourage you to take your nausea medication as directed.  BELOW ARE SYMPTOMS THAT SHOULD BE REPORTED IMMEDIATELY: *FEVER GREATER THAN 100.4 F (38 C) OR HIGHER *CHILLS OR SWEATING *NAUSEA AND VOMITING THAT IS NOT CONTROLLED WITH YOUR NAUSEA MEDICATION *UNUSUAL SHORTNESS OF BREATH *UNUSUAL BRUISING OR BLEEDING *URINARY PROBLEMS (pain or burning when urinating, or frequent urination) *BOWEL PROBLEMS (unusual diarrhea, constipation, pain near the anus) TENDERNESS IN MOUTH AND THROAT WITH OR WITHOUT PRESENCE OF ULCERS (sore throat, sores in mouth, or a toothache) UNUSUAL RASH, SWELLING OR PAIN  UNUSUAL VAGINAL DISCHARGE OR ITCHING   Items with * indicate a potential emergency and should be followed up as soon as possible or go to the Emergency Department if any problems should occur.  Please show the  CHEMOTHERAPY ALERT CARD or IMMUNOTHERAPY ALERT CARD at check-in to the Emergency Department and triage nurse.  Should you have questions after your visit or need to cancel or reschedule your appointment, please contact [facility] - A DEPT OF [facility]  Dept: [phone]  and follow the prompts.  Office hours are 8:00 a.m. to 4:30 p.m. Monday - Friday. Please note that voicemails left after 4:00 p.m. may not be returned until the following business day.  We are closed weekends and major holidays. You have access to a nurse at all times for urgent questions. Please call the main number to the clinic Dept: [phone] and follow the prompts.   For any non-urgent questions, you may also contact your provider using MyChart. We now offer e-Visits for anyone 56 and older to request care online for non-urgent symptoms. For details visit [email or web address].   Also download the MyChart app! Go to the app store, search MyChart, open the app, select [place], and log in with your MyChart username and password.

## 2024-07-07 NOTE — Patient Instructions (Signed)

## 2024-07-08 LAB — CANCER ANTIGEN 19-9: CA 19-9: 10230 U/mL — ABNORMAL HIGH (ref 0–35)

## 2024-07-09 ENCOUNTER — Other Ambulatory Visit (HOSPITAL_BASED_OUTPATIENT_CLINIC_OR_DEPARTMENT_OTHER): Payer: Self-pay

## 2024-07-09 ENCOUNTER — Encounter: Payer: Self-pay | Admitting: Oncology

## 2024-07-09 ENCOUNTER — Inpatient Hospital Stay

## 2024-07-09 NOTE — Patient Instructions (Signed)
 [facility]  Discharge Instructions: Thank you for choosing [facility] to provide your oncology and hematology care.   If you have a lab appointment with the Cancer Center, please go directly to the Cancer Center and check in at the registration area.   Wear comfortable clothing and clothing appropriate for easy access to any Portacath or PICC line.   We strive to give you quality time with your provider. You may need to reschedule your appointment if you arrive late (15 or more minutes).  Arriving late affects you and other patients whose appointments are after yours.  Also, if you miss three or more appointments without notifying the office, you may be dismissed from the clinic at the provider's discretion.      For prescription refill requests, have your pharmacy contact our office and allow 72 hours for refills to be completed.    Today you received the following Pump Stop.    To help prevent nausea and vomiting after your treatment, we encourage you to take your nausea medication as directed.  BELOW ARE SYMPTOMS THAT SHOULD BE REPORTED IMMEDIATELY: *FEVER GREATER THAN 100.4 F (38 C) OR HIGHER *CHILLS OR SWEATING *NAUSEA AND VOMITING THAT IS NOT CONTROLLED WITH YOUR NAUSEA MEDICATION *UNUSUAL SHORTNESS OF BREATH *UNUSUAL BRUISING OR BLEEDING *URINARY PROBLEMS (pain or burning when urinating, or frequent urination) *BOWEL PROBLEMS (unusual diarrhea, constipation, pain near the anus) TENDERNESS IN MOUTH AND THROAT WITH OR WITHOUT PRESENCE OF ULCERS (sore throat, sores in mouth, or a toothache) UNUSUAL RASH, SWELLING OR PAIN  UNUSUAL VAGINAL DISCHARGE OR ITCHING   Items with * indicate a potential emergency and should be followed up as soon as possible or go to the Emergency Department if any problems should occur.  Please show the CHEMOTHERAPY ALERT CARD or IMMUNOTHERAPY ALERT CARD at check-in to the Emergency  Department and triage nurse.  Should you have questions after your visit or need to cancel or reschedule your appointment, please contact [facility]  Dept: [phone]  and follow the prompts.  Office hours are 8:00 a.m. to 4:30 p.m. Monday - Friday. Please note that voicemails left after 4:00 p.m. may not be returned until the following business day.  We are closed weekends and major holidays. You have access to a nurse at all times for urgent questions. Please call the main number to the clinic Dept: [phone] and follow the prompts.   For any non-urgent questions, you may also contact your provider using MyChart. We now offer e-Visits for anyone 68 and older to request care online for non-urgent symptoms. For details visit [email or web address].   Also download the MyChart app! Go to the app store, search "MyChart", open the app, select [place], and log in with your MyChart username and password.

## 2024-07-09 NOTE — Progress Notes (Signed)
 Patient presents today for PUMP STOP. Patients port flushed without difficulty.  Good blood return noted with no bruising or swelling noted at site. Pump removed without issue. Band aid applied. Patient reports needing refill on his potassium chloride  prescription. Message sent to Devere Leaven RN. Will notify the patient with answer. Phone number verified. VSS with discharge and left in satisfactory condition with no s/s of distress noted.

## 2024-07-16 ENCOUNTER — Other Ambulatory Visit: Payer: Self-pay

## 2024-07-16 NOTE — Progress Notes (Signed)
 The proposed treatment discussed in conference is for discussion purpose only and is not a binding recommendation.  The patients have not been physically examined, or presented with their treatment options.  Therefore, final treatment plans cannot be decided.

## 2024-07-17 ENCOUNTER — Telehealth: Payer: Self-pay | Admitting: Oncology

## 2024-07-17 NOTE — Telephone Encounter (Signed)
 Called PT to update on clinic hours due to the weather; left detailed message on PT voicemail.

## 2024-07-18 ENCOUNTER — Telehealth: Payer: Self-pay

## 2024-07-18 NOTE — Telephone Encounter (Signed)
 Spoke directly with the patient to inform him that the clinic will have a delayed start on Monday, 07/21/24 due to inclement weather. Advised him that his first appointment is scheduled for 0945. Patient inquired about rescheduling to another day. He was informed that the soonest available appointments would be Wednesday or Thursday of the same week. Patient elected to keep the Monday appointment as scheduled.

## 2024-07-20 ENCOUNTER — Telehealth: Payer: Self-pay

## 2024-07-20 NOTE — Telephone Encounter (Signed)
 Spoke directly with patient advising of new appointments date and time, he was agreeable and verbalized understanding.

## 2024-07-21 ENCOUNTER — Inpatient Hospital Stay

## 2024-07-21 ENCOUNTER — Inpatient Hospital Stay: Admitting: Oncology

## 2024-07-21 ENCOUNTER — Inpatient Hospital Stay: Admitting: Nutrition

## 2024-07-21 ENCOUNTER — Encounter: Payer: Self-pay | Admitting: Oncology

## 2024-07-22 ENCOUNTER — Telehealth: Payer: Self-pay

## 2024-07-22 ENCOUNTER — Inpatient Hospital Stay

## 2024-07-22 ENCOUNTER — Encounter: Payer: Self-pay | Admitting: Nurse Practitioner

## 2024-07-22 ENCOUNTER — Other Ambulatory Visit (HOSPITAL_BASED_OUTPATIENT_CLINIC_OR_DEPARTMENT_OTHER): Payer: Self-pay

## 2024-07-22 ENCOUNTER — Encounter: Payer: Self-pay | Admitting: Oncology

## 2024-07-22 ENCOUNTER — Inpatient Hospital Stay: Admitting: Nurse Practitioner

## 2024-07-22 VITALS — BP 139/91 | HR 73 | Temp 98.2°F | Resp 18 | Ht 76.0 in | Wt 212.4 lb

## 2024-07-22 DIAGNOSIS — C259 Malignant neoplasm of pancreas, unspecified: Secondary | ICD-10-CM

## 2024-07-22 LAB — CMP (CANCER CENTER ONLY)
ALT: 66 U/L — ABNORMAL HIGH (ref 0–44)
AST: 54 U/L — ABNORMAL HIGH (ref 15–41)
Albumin: 4.1 g/dL (ref 3.5–5.0)
Alkaline Phosphatase: 253 U/L — ABNORMAL HIGH (ref 38–126)
Anion gap: 9 (ref 5–15)
BUN: 12 mg/dL (ref 6–20)
CO2: 29 mmol/L (ref 22–32)
Calcium: 9.6 mg/dL (ref 8.9–10.3)
Chloride: 102 mmol/L (ref 98–111)
Creatinine: 1.02 mg/dL (ref 0.61–1.24)
GFR, Estimated: >60 mL/min
Glucose, Bld: 170 mg/dL — ABNORMAL HIGH (ref 70–99)
Potassium: 3.2 mmol/L — ABNORMAL LOW (ref 3.5–5.1)
Sodium: 140 mmol/L (ref 135–145)
Total Bilirubin: 0.8 mg/dL (ref 0.0–1.2)
Total Protein: 7.6 g/dL (ref 6.5–8.1)

## 2024-07-22 LAB — CBC WITH DIFFERENTIAL (CANCER CENTER ONLY)
Abs Immature Granulocytes: 0.02 10*3/uL (ref 0.00–0.07)
Basophils Absolute: 0 10*3/uL (ref 0.0–0.1)
Basophils Relative: 1 %
Eosinophils Absolute: 0.1 10*3/uL (ref 0.0–0.5)
Eosinophils Relative: 2 %
HCT: 29.3 % — ABNORMAL LOW (ref 39.0–52.0)
Hemoglobin: 9.5 g/dL — ABNORMAL LOW (ref 13.0–17.0)
Immature Granulocytes: 1 %
Lymphocytes Relative: 23 %
Lymphs Abs: 1 10*3/uL (ref 0.7–4.0)
MCH: 21.3 pg — ABNORMAL LOW (ref 26.0–34.0)
MCHC: 32.4 g/dL (ref 30.0–36.0)
MCV: 65.5 fL — ABNORMAL LOW (ref 80.0–100.0)
Monocytes Absolute: 0.7 10*3/uL (ref 0.1–1.0)
Monocytes Relative: 16 %
Neutro Abs: 2.4 10*3/uL (ref 1.7–7.7)
Neutrophils Relative %: 57 %
Platelet Count: 137 10*3/uL — ABNORMAL LOW (ref 150–400)
RBC: 4.47 MIL/uL (ref 4.22–5.81)
RDW: 21.9 % — ABNORMAL HIGH (ref 11.5–15.5)
WBC Count: 4.2 10*3/uL (ref 4.0–10.5)
nRBC: 0 % (ref 0.0–0.2)

## 2024-07-23 ENCOUNTER — Inpatient Hospital Stay

## 2024-07-23 LAB — CANCER ANTIGEN 19-9: CA 19-9: 6249 U/mL — ABNORMAL HIGH (ref 0–35)

## 2024-07-24 ENCOUNTER — Other Ambulatory Visit: Payer: Self-pay

## 2024-07-24 ENCOUNTER — Inpatient Hospital Stay

## 2024-07-24 VITALS — BP 151/96 | HR 72 | Temp 97.9°F | Resp 18

## 2024-07-24 DIAGNOSIS — C259 Malignant neoplasm of pancreas, unspecified: Secondary | ICD-10-CM

## 2024-07-24 MED ORDER — DEXAMETHASONE SOD PHOSPHATE PF 10 MG/ML IJ SOLN
10.0000 mg | Freq: Once | INTRAMUSCULAR | Status: AC
  Administered 2024-07-24: 10 mg via INTRAVENOUS
  Filled 2024-07-24: qty 1

## 2024-07-24 MED ORDER — SODIUM CHLORIDE 0.9 % IV SOLN
100.0000 mg/m2 | Freq: Once | INTRAVENOUS | Status: AC
  Administered 2024-07-24: 200 mg via INTRAVENOUS
  Filled 2024-07-24: qty 10

## 2024-07-24 MED ORDER — DEXTROSE 5 % IV SOLN: INTRAVENOUS | Status: DC

## 2024-07-24 MED ORDER — PROCHLORPERAZINE EDISYLATE 10 MG/2ML IJ SOLN
10.0000 mg | Freq: Once | INTRAMUSCULAR | Status: AC
  Administered 2024-07-24: 10 mg via INTRAVENOUS
  Filled 2024-07-24: qty 2

## 2024-07-24 MED ORDER — PALONOSETRON HCL INJECTION 0.25 MG/5ML
0.2500 mg | Freq: Once | INTRAVENOUS | Status: AC
  Administered 2024-07-24: 0.25 mg via INTRAVENOUS
  Filled 2024-07-24: qty 5

## 2024-07-24 MED ORDER — SODIUM CHLORIDE 0.9 % IV SOLN
200.0000 mg/m2 | Freq: Once | INTRAVENOUS | Status: AC
  Administered 2024-07-24: 436 mg via INTRAVENOUS
  Filled 2024-07-24: qty 21.8

## 2024-07-24 MED ORDER — SODIUM CHLORIDE 0.9 % IV SOLN
150.0000 mg | Freq: Once | INTRAVENOUS | Status: AC
  Administered 2024-07-24: 150 mg via INTRAVENOUS
  Filled 2024-07-24: qty 150

## 2024-07-24 MED ORDER — SODIUM CHLORIDE 0.9 % IV SOLN
1800.0000 mg/m2 | INTRAVENOUS | Status: DC
  Administered 2024-07-24: 3900 mg via INTRAVENOUS
  Filled 2024-07-24: qty 78

## 2024-07-24 MED ORDER — OXALIPLATIN CHEMO INJECTION 100 MG/20ML
65.0000 mg/m2 | Freq: Once | INTRAVENOUS | Status: AC
  Administered 2024-07-24: 150 mg via INTRAVENOUS
  Filled 2024-07-24: qty 20

## 2024-07-24 MED ORDER — ATROPINE SULFATE 1 MG/ML IV SOLN
0.5000 mg | Freq: Once | INTRAVENOUS | Status: AC | PRN
  Administered 2024-07-24: 0.5 mg via INTRAVENOUS
  Filled 2024-07-24: qty 1

## 2024-07-24 NOTE — Progress Notes (Signed)
 C/o nausea late in the irinotecan  infusion (paused infusion). Dr. Cloretta ordered IV compazine  10mg . See MAR. Pt reported improvement approximately 10 minutes after compazine . Irinotecan  resumed without incident.   Pt's irinotecan  & leucovorin  infusion was correctly infused with NS but not noted on the [facility] (this nurse was not present when the change was made). D5 was discontinued when oxaliplatin  was complete.

## 2024-07-24 NOTE — Patient Instructions (Signed)
 [facility] - A DEPT OF [facility]  Discharge Instructions: Thank you for choosing [facility] to provide your oncology and hematology care.   If you have a lab appointment with the Cancer Center, please go directly to the Cancer Center and check in at the registration area.   Wear comfortable clothing and clothing appropriate for easy access to any Portacath or PICC line.   We strive to give you quality time with your provider. You may need to reschedule your appointment if you arrive late (15 or more minutes).  Arriving late affects you and other patients whose appointments are after yours.  Also, if you miss three or more appointments without notifying the office, you may be dismissed from the clinic at the providers discretion.      For prescription refill requests, have your pharmacy contact our office and allow 72 hours for refills to be completed.    Today you received the following chemotherapy and/or immunotherapy agents: oxaliplatin , leucovorin , irinotecan , fluorouracil        To help prevent nausea and vomiting after your treatment, we encourage you to take your nausea medication as directed.  BELOW ARE SYMPTOMS THAT SHOULD BE REPORTED IMMEDIATELY: *FEVER GREATER THAN 100.4 F (38 C) OR HIGHER *CHILLS OR SWEATING *NAUSEA AND VOMITING THAT IS NOT CONTROLLED WITH YOUR NAUSEA MEDICATION *UNUSUAL SHORTNESS OF BREATH *UNUSUAL BRUISING OR BLEEDING *URINARY PROBLEMS (pain or burning when urinating, or frequent urination) *BOWEL PROBLEMS (unusual diarrhea, constipation, pain near the anus) TENDERNESS IN MOUTH AND THROAT WITH OR WITHOUT PRESENCE OF ULCERS (sore throat, sores in mouth, or a toothache) UNUSUAL RASH, SWELLING OR PAIN  UNUSUAL VAGINAL DISCHARGE OR ITCHING   Items with * indicate a potential emergency and should be followed up as soon as possible or go to the Emergency Department if any problems should occur.  Please show the  CHEMOTHERAPY ALERT CARD or IMMUNOTHERAPY ALERT CARD at check-in to the Emergency Department and triage nurse.  Should you have questions after your visit or need to cancel or reschedule your appointment, please contact [facility] - A DEPT OF [facility]  Dept: [phone]  and follow the prompts.  Office hours are 8:00 a.m. to 4:30 p.m. Monday - Friday. Please note that voicemails left after 4:00 p.m. may not be returned until the following business day.  We are closed weekends and major holidays. You have access to a nurse at all times for urgent questions. Please call the main number to the clinic Dept: [phone] and follow the prompts.   For any non-urgent questions, you may also contact your provider using MyChart. We now offer e-Visits for anyone 56 and older to request care online for non-urgent symptoms. For details visit [email or web address].   Also download the MyChart app! Go to the app store, search MyChart, open the app, select [place], and log in with your MyChart username and password.

## 2024-07-26 ENCOUNTER — Inpatient Hospital Stay

## 2024-08-04 ENCOUNTER — Inpatient Hospital Stay

## 2024-08-04 ENCOUNTER — Inpatient Hospital Stay: Admitting: Oncology

## 2024-08-06 ENCOUNTER — Inpatient Hospital Stay

## 2024-08-11 ENCOUNTER — Inpatient Hospital Stay: Admitting: Nurse Practitioner

## 2024-08-11 ENCOUNTER — Inpatient Hospital Stay: Payer: Self-pay

## 2024-08-12 ENCOUNTER — Inpatient Hospital Stay: Payer: Self-pay

## 2024-08-14 ENCOUNTER — Inpatient Hospital Stay: Payer: Self-pay

## 2024-08-25 ENCOUNTER — Inpatient Hospital Stay: Admitting: Nurse Practitioner

## 2024-08-25 ENCOUNTER — Inpatient Hospital Stay

## 2024-08-27 ENCOUNTER — Inpatient Hospital Stay
